# Patient Record
Sex: Female | Born: 1940 | Race: Black or African American | Hispanic: No | Smoking: Former smoker
Health system: Southern US, Community
[De-identification: ages and names within clinical notes are randomized; demographics above are authoritative.]

## PROBLEM LIST (undated history)

## (undated) DIAGNOSIS — C50919 Malignant neoplasm of unspecified site of unspecified female breast: Secondary | ICD-10-CM

## (undated) DIAGNOSIS — C801 Malignant (primary) neoplasm, unspecified: Secondary | ICD-10-CM

## (undated) DIAGNOSIS — N184 Chronic kidney disease, stage 4 (severe): Secondary | ICD-10-CM

## (undated) DIAGNOSIS — N289 Disorder of kidney and ureter, unspecified: Secondary | ICD-10-CM

## (undated) DIAGNOSIS — E559 Vitamin D deficiency, unspecified: Secondary | ICD-10-CM

## (undated) DIAGNOSIS — E538 Deficiency of other specified B group vitamins: Secondary | ICD-10-CM

## (undated) DIAGNOSIS — N189 Chronic kidney disease, unspecified: Secondary | ICD-10-CM

## (undated) DIAGNOSIS — Z8719 Personal history of other diseases of the digestive system: Secondary | ICD-10-CM

## (undated) DIAGNOSIS — G4730 Sleep apnea, unspecified: Secondary | ICD-10-CM

## (undated) DIAGNOSIS — J45909 Unspecified asthma, uncomplicated: Secondary | ICD-10-CM

## (undated) DIAGNOSIS — D649 Anemia, unspecified: Secondary | ICD-10-CM

## (undated) DIAGNOSIS — J189 Pneumonia, unspecified organism: Secondary | ICD-10-CM

## (undated) DIAGNOSIS — I10 Essential (primary) hypertension: Secondary | ICD-10-CM

## (undated) DIAGNOSIS — Z923 Personal history of irradiation: Secondary | ICD-10-CM

## (undated) DIAGNOSIS — M1990 Unspecified osteoarthritis, unspecified site: Secondary | ICD-10-CM

## (undated) DIAGNOSIS — T4145XA Adverse effect of unspecified anesthetic, initial encounter: Secondary | ICD-10-CM

## (undated) DIAGNOSIS — I639 Cerebral infarction, unspecified: Secondary | ICD-10-CM

## (undated) DIAGNOSIS — T8859XA Other complications of anesthesia, initial encounter: Secondary | ICD-10-CM

## (undated) DIAGNOSIS — M109 Gout, unspecified: Secondary | ICD-10-CM

## (undated) DIAGNOSIS — E119 Type 2 diabetes mellitus without complications: Secondary | ICD-10-CM

## (undated) DIAGNOSIS — E213 Hyperparathyroidism, unspecified: Secondary | ICD-10-CM

## (undated) DIAGNOSIS — E785 Hyperlipidemia, unspecified: Secondary | ICD-10-CM

## (undated) DIAGNOSIS — R0600 Dyspnea, unspecified: Secondary | ICD-10-CM

## (undated) DIAGNOSIS — K219 Gastro-esophageal reflux disease without esophagitis: Secondary | ICD-10-CM

## (undated) HISTORY — DX: Cerebral infarction, unspecified: I63.9

## (undated) HISTORY — DX: Unspecified asthma, uncomplicated: J45.909

## (undated) HISTORY — DX: Unspecified osteoarthritis, unspecified site: M19.90

## (undated) HISTORY — DX: Malignant neoplasm of unspecified site of unspecified female breast: C50.919

## (undated) HISTORY — DX: Essential (primary) hypertension: I10

## (undated) HISTORY — PX: EYE SURGERY: SHX253

## (undated) HISTORY — PX: MASTECTOMY: SHX3

## (undated) HISTORY — PX: TONSILLECTOMY: SUR1361

## (undated) HISTORY — DX: Type 2 diabetes mellitus without complications: E11.9

## (undated) HISTORY — DX: Hyperlipidemia, unspecified: E78.5

---

## 1977-03-27 HISTORY — PX: ABDOMINAL HYSTERECTOMY: SHX81

## 2005-09-05 ENCOUNTER — Ambulatory Visit: Payer: Self-pay

## 2008-01-08 ENCOUNTER — Ambulatory Visit: Payer: Self-pay | Admitting: Unknown Physician Specialty

## 2008-09-23 ENCOUNTER — Ambulatory Visit: Payer: Self-pay | Admitting: Family Medicine

## 2009-09-28 ENCOUNTER — Ambulatory Visit: Payer: Self-pay | Admitting: Family Medicine

## 2010-10-13 ENCOUNTER — Ambulatory Visit: Payer: Self-pay | Admitting: Family Medicine

## 2011-03-05 ENCOUNTER — Observation Stay: Payer: Self-pay | Admitting: Student

## 2011-04-28 ENCOUNTER — Ambulatory Visit: Payer: Self-pay | Admitting: Specialist

## 2011-10-16 ENCOUNTER — Ambulatory Visit: Payer: Self-pay | Admitting: Family Medicine

## 2011-11-17 ENCOUNTER — Ambulatory Visit: Payer: Self-pay | Admitting: Specialist

## 2012-03-27 HISTORY — PX: OOPHORECTOMY: SHX86

## 2012-07-30 ENCOUNTER — Ambulatory Visit: Payer: Self-pay | Admitting: Family Medicine

## 2012-09-18 ENCOUNTER — Ambulatory Visit: Payer: Self-pay | Admitting: Obstetrics and Gynecology

## 2012-09-18 LAB — BASIC METABOLIC PANEL
Anion Gap: 9 (ref 7–16)
BUN: 35 mg/dL — ABNORMAL HIGH (ref 7–18)
Calcium, Total: 9.7 mg/dL (ref 8.5–10.1)
Co2: 20 mmol/L — ABNORMAL LOW (ref 21–32)
EGFR (Non-African Amer.): 29 — ABNORMAL LOW
Glucose: 147 mg/dL — ABNORMAL HIGH (ref 65–99)
Osmolality: 292 (ref 275–301)
Potassium: 4.6 mmol/L (ref 3.5–5.1)
Sodium: 141 mmol/L (ref 136–145)

## 2012-09-23 ENCOUNTER — Ambulatory Visit: Payer: Self-pay | Admitting: Obstetrics and Gynecology

## 2012-10-01 LAB — PATHOLOGY REPORT

## 2012-10-16 ENCOUNTER — Ambulatory Visit: Payer: Self-pay | Admitting: Family Medicine

## 2013-11-05 ENCOUNTER — Ambulatory Visit: Payer: Self-pay | Admitting: Physician Assistant

## 2014-03-27 DIAGNOSIS — I639 Cerebral infarction, unspecified: Secondary | ICD-10-CM

## 2014-03-27 DIAGNOSIS — Z923 Personal history of irradiation: Secondary | ICD-10-CM

## 2014-03-27 DIAGNOSIS — T8859XA Other complications of anesthesia, initial encounter: Secondary | ICD-10-CM

## 2014-03-27 HISTORY — DX: Cerebral infarction, unspecified: I63.9

## 2014-03-27 HISTORY — PX: BREAST LUMPECTOMY: SHX2

## 2014-03-27 HISTORY — DX: Other complications of anesthesia, initial encounter: T88.59XA

## 2014-03-27 HISTORY — DX: Personal history of irradiation: Z92.3

## 2014-04-24 DIAGNOSIS — Z0100 Encounter for examination of eyes and vision without abnormal findings: Secondary | ICD-10-CM | POA: Diagnosis not present

## 2014-05-06 DIAGNOSIS — E119 Type 2 diabetes mellitus without complications: Secondary | ICD-10-CM | POA: Diagnosis not present

## 2014-05-06 DIAGNOSIS — Z79899 Other long term (current) drug therapy: Secondary | ICD-10-CM | POA: Diagnosis not present

## 2014-05-06 DIAGNOSIS — I10 Essential (primary) hypertension: Secondary | ICD-10-CM | POA: Diagnosis not present

## 2014-05-15 DIAGNOSIS — E785 Hyperlipidemia, unspecified: Secondary | ICD-10-CM | POA: Diagnosis not present

## 2014-05-15 DIAGNOSIS — E119 Type 2 diabetes mellitus without complications: Secondary | ICD-10-CM | POA: Diagnosis not present

## 2014-05-15 DIAGNOSIS — N289 Disorder of kidney and ureter, unspecified: Secondary | ICD-10-CM | POA: Diagnosis not present

## 2014-05-15 DIAGNOSIS — I10 Essential (primary) hypertension: Secondary | ICD-10-CM | POA: Diagnosis not present

## 2014-06-05 DIAGNOSIS — N289 Disorder of kidney and ureter, unspecified: Secondary | ICD-10-CM | POA: Diagnosis not present

## 2014-06-05 DIAGNOSIS — B0230 Zoster ocular disease, unspecified: Secondary | ICD-10-CM | POA: Diagnosis not present

## 2014-06-05 DIAGNOSIS — B0239 Other herpes zoster eye disease: Secondary | ICD-10-CM | POA: Diagnosis not present

## 2014-07-17 NOTE — H&P (Signed)
PATIENT NAME:  Alexis Greer, Alexis Greer MR#:  [id number] DATE OF BIRTH:  October 22, 1940  DATE OF ADMISSION:  09/23/2012  PREOPERATIVE DIAGNOSIS: Solid right ovarian cyst.   POSTOPERATIVE DIAGNOSES: 1.  Solid right ovarian cyst.  2.  Significant pelvic adhesions.   PROCEDURES: Laparoscopic right salpingo-oophorectomy.   SURGEON: Boykin Nearing, MD   FIRST ASSISTANT: Franchot Erichsen, MD   INDICATIONS: This is a 75 year old gravida 2 para 2.  The patient was noted to have a right ovarian mass on CT scan, underwent an ultrasound that showed a solid 5.0 x 3.8 cm right ovarian cyst. CA-125, AFP and LDH all within normal limits.   DESCRIPTION OF PROCEDURE: After adequate general endotracheal anesthesia, the patient was placed in the dorsal supine position, legs placed in the Roland stirrups. Abdomen, perineal and vagina prepped in normal sterile fashion. Foley catheter advanced into the bladder. Drained clear and sponge stick placed in the vagina to be used for manipulation during the procedure. The patient was sterilely draped. A 15 mm infraumbilical incision was made, followed by placement of the laparoscope into the abdominal cavity with the Optiview cannula. No difficulties with this.  The patient's abdomen was insufflated with carbon dioxide. A second port was placed 2 fingerbreadths above the symphysis pubis inferior to the large pannus.  A 10 mm port advanced into the abdominal cavity under direct visualization. A third port was placed in the right lower quadrant 2 cm above the symphysis pubis, sharp dissection used, and a 5 mm port was advanced into the abdominal cavity under direct visualization. Given the patient's morbid obesity difficulty moving bowel cephalad prompted the surgeons to place a fourth port 2 fingerbreadths above the symphysis pubis centrally and the endo retractor was placed into the abdominal cavity to help push the bowel cephalad so evaluation of the pelvis could be accomplished. The  patient's right ovary was encased with scar tissue. The next 20 minutes of the case involved removing the scar tissue. Ultimately, the infundibulopelvic ligament was coagulated with the Kleppingers and Harmonic scalpel was used to then remove the right tube and ovary. Attention was directed towards keeping instruments close to the ovary and not into the sidewall. Good hemostasis was noted. The right ovary and fallopian tube were then placed into an Endobag and removed through the left lower port site. Fascia incision was extended to facilitate removal of the solid ovarian mass. The patient was originally scheduled for bilateral salpingo-oophorectomy and she is status post prior hysterectomy. There were dense omental adhesions on the left sidewall and the ovary could not be visualized given the amount of scar tissue and the pathology was all dictated towards the right side. Surgeons opted not to perform any additional adhesiolysis there. Therefore, the left tube and ovary were left in situ. Pressure was lowered. The patient's abdomen was irrigated. Good hemostasis noted. The patient's abdomen was then deflated, and the port sites were closed with 2-0 fascial layer on the left and then all port sites were closed with interrupted 4-0 Vicryl suture. The left lower port site did need additional 2-0 Vicryl subcutaneous suture to close dead space. All port sites were then Steri-Stripped and covered with Tegaderm. Sponge stick removed and the Foley was removed. There were no complications. Estimated blood loss minimal. Intraoperative fluids 1500 mL. The patient tolerated the procedure well and was taken to the recovery room in good condition.  ____________________________ Boykin Nearing, MD tjs:sb D: 09/23/2012 09:21:36 ET T: 09/23/2012 10:28:21 ET JOB#: [id number]  cc:  Boykin Nearing, MD, <Dictator> Boykin Nearing MD ELECTRONICALLY SIGNED 09/23/2012 13:22

## 2014-07-17 NOTE — Op Note (Signed)
PATIENT NAME:  Alexis Greer, BORELLI MR#:  [id number] DATE OF BIRTH:  [birth year]  DATE OF ADMISSION:  09/23/2012  PREOPERATIVE DIAGNOSIS: Solid right ovarian cyst.   POSTOPERATIVE DIAGNOSES: 1.  Solid right ovarian cyst.  2.  Significant pelvic adhesions.   PROCEDURES: Laparoscopic right salpingo-oophorectomy.   SURGEON: Boykin Nearing, MD   FIRST ASSISTANT: Franchot Erichsen, MD   INDICATIONS: This is a 74 year old gravida 2 para 2.  The patient was noted to have a right ovarian mass on CT scan, underwent an ultrasound that showed a solid 5.0 x 3.8 cm right ovarian cyst. CA-125, AFP and LDH all within normal limits.   DESCRIPTION OF PROCEDURE: After adequate general endotracheal anesthesia, the patient was placed in the dorsal supine position, legs placed in the [place] stirrups. Abdomen, perineal and vagina prepped in normal sterile fashion. Foley catheter advanced into the bladder. Drained clear and sponge stick placed in the vagina to be used for manipulation during the procedure. The patient was sterilely draped. A 15 mm infraumbilical incision was made, followed by placement of the laparoscope into the abdominal cavity with the Optiview cannula. No difficulties with this.  The patient's abdomen was insufflated with carbon dioxide. A second port was placed 2 fingerbreadths above the symphysis pubis inferior to the large pannus.  A 10 mm port advanced into the abdominal cavity under direct visualization. A third port was placed in the right lower quadrant 2 cm above the symphysis pubis, sharp dissection used, and a 5 mm port was advanced into the abdominal cavity under direct visualization. Given the patient's morbid obesity difficulty moving bowel cephalad prompted the surgeons to place a fourth port 2 fingerbreadths above the symphysis pubis centrally and the endo retractor was placed into the abdominal cavity to help push the bowel cephalad so evaluation of the pelvis could be accomplished. The  patient's right ovary was encased with scar tissue. The next 20 minutes of the case involved removing the scar tissue. Ultimately, the infundibulopelvic ligament was coagulated with the Kleppingers and Harmonic scalpel was used to then remove the right tube and ovary. Attention was directed towards keeping instruments close to the ovary and not into the sidewall. Good hemostasis was noted. The right ovary and fallopian tube were then placed into an Endobag and removed through the left lower port site. Fascia incision was extended to facilitate removal of the solid ovarian mass. The patient was originally scheduled for bilateral salpingo-oophorectomy and she is status post prior hysterectomy. There were dense omental adhesions on the left sidewall and the ovary could not be visualized given the amount of scar tissue and the pathology was all dictated towards the right side. Surgeons opted not to perform any additional adhesiolysis there. Therefore, the left tube and ovary were left in situ. Pressure was lowered. The patient's abdomen was irrigated. Good hemostasis noted. The patient's abdomen was then deflated, and the port sites were closed with 2-0 fascial layer on the left and then all port sites were closed with interrupted 4-0 Vicryl suture. The left lower port site did need additional 2-0 Vicryl subcutaneous suture to close dead space. All port sites were then Steri-Stripped and covered with Tegaderm. Sponge stick removed and the Foley was removed. There were no complications. Estimated blood loss minimal. Intraoperative fluids 1500 mL. The patient tolerated the procedure well and was taken to the recovery room in good condition.  ____________________________ Boykin Nearing, MD tjs:sb D: 09/23/2012 09:21:36 ET T: 09/23/2012 10:28:21 ET JOB#: [id number]  cc:  Boykin Nearing, MD, <Dictator> Boykin Nearing MD ELECTRONICALLY SIGNED 09/23/2012 13:22

## 2014-10-14 DIAGNOSIS — Z79899 Other long term (current) drug therapy: Secondary | ICD-10-CM | POA: Diagnosis not present

## 2014-10-14 DIAGNOSIS — E119 Type 2 diabetes mellitus without complications: Secondary | ICD-10-CM | POA: Diagnosis not present

## 2014-10-21 DIAGNOSIS — M1A9XX0 Chronic gout, unspecified, without tophus (tophi): Secondary | ICD-10-CM | POA: Diagnosis not present

## 2014-10-21 DIAGNOSIS — E785 Hyperlipidemia, unspecified: Secondary | ICD-10-CM | POA: Diagnosis not present

## 2014-10-21 DIAGNOSIS — E119 Type 2 diabetes mellitus without complications: Secondary | ICD-10-CM | POA: Diagnosis not present

## 2014-10-21 DIAGNOSIS — I10 Essential (primary) hypertension: Secondary | ICD-10-CM | POA: Diagnosis not present

## 2014-10-29 ENCOUNTER — Other Ambulatory Visit: Payer: Self-pay | Admitting: Family Medicine

## 2014-10-29 DIAGNOSIS — Z1239 Encounter for other screening for malignant neoplasm of breast: Secondary | ICD-10-CM

## 2014-11-09 ENCOUNTER — Ambulatory Visit: Payer: Self-pay

## 2014-11-09 ENCOUNTER — Ambulatory Visit
Admission: RE | Admit: 2014-11-09 | Discharge: 2014-11-09 | Disposition: A | Discharge status: Home / Self Care | Payer: Commercial Managed Care - HMO | Source: Ambulatory Visit | Attending: Family Medicine | Admitting: Family Medicine

## 2014-11-09 ENCOUNTER — Other Ambulatory Visit: Payer: Self-pay | Admitting: Adult Health

## 2014-11-09 ENCOUNTER — Other Ambulatory Visit: Payer: Self-pay | Admitting: Family Medicine

## 2014-11-09 DIAGNOSIS — Z1239 Encounter for other screening for malignant neoplasm of breast: Secondary | ICD-10-CM

## 2014-11-09 DIAGNOSIS — Z1231 Encounter for screening mammogram for malignant neoplasm of breast: Secondary | ICD-10-CM | POA: Diagnosis not present

## 2014-11-09 DIAGNOSIS — R928 Other abnormal and inconclusive findings on diagnostic imaging of breast: Secondary | ICD-10-CM

## 2014-11-09 DIAGNOSIS — N63 Unspecified lump in unspecified breast: Secondary | ICD-10-CM

## 2014-11-10 DIAGNOSIS — E559 Vitamin D deficiency, unspecified: Secondary | ICD-10-CM | POA: Diagnosis not present

## 2014-11-10 DIAGNOSIS — E538 Deficiency of other specified B group vitamins: Secondary | ICD-10-CM | POA: Diagnosis not present

## 2014-11-10 DIAGNOSIS — Z0000 Encounter for general adult medical examination without abnormal findings: Secondary | ICD-10-CM | POA: Diagnosis not present

## 2014-11-10 DIAGNOSIS — E780 Pure hypercholesterolemia: Secondary | ICD-10-CM | POA: Diagnosis not present

## 2014-11-10 DIAGNOSIS — N289 Disorder of kidney and ureter, unspecified: Secondary | ICD-10-CM | POA: Diagnosis not present

## 2014-11-13 ENCOUNTER — Ambulatory Visit
Admission: RE | Admit: 2014-11-13 | Discharge: 2014-11-13 | Disposition: A | Discharge status: Home / Self Care | Payer: Commercial Managed Care - HMO | Source: Ambulatory Visit | Attending: Family Medicine | Admitting: Family Medicine

## 2014-11-13 ENCOUNTER — Other Ambulatory Visit: Payer: Self-pay | Admitting: Adult Health

## 2014-11-13 DIAGNOSIS — N63 Unspecified lump in unspecified breast: Secondary | ICD-10-CM

## 2014-11-13 DIAGNOSIS — R928 Other abnormal and inconclusive findings on diagnostic imaging of breast: Secondary | ICD-10-CM

## 2014-11-17 ENCOUNTER — Ambulatory Visit
Admission: RE | Admit: 2014-11-17 | Discharge: 2014-11-17 | Disposition: A | Discharge status: Home / Self Care | Payer: Commercial Managed Care - HMO | Source: Ambulatory Visit | Attending: Family Medicine | Admitting: Family Medicine

## 2014-11-17 DIAGNOSIS — N63 Unspecified lump in unspecified breast: Secondary | ICD-10-CM

## 2014-11-17 DIAGNOSIS — R928 Other abnormal and inconclusive findings on diagnostic imaging of breast: Secondary | ICD-10-CM

## 2014-11-17 DIAGNOSIS — C50211 Malignant neoplasm of upper-inner quadrant of right female breast: Secondary | ICD-10-CM | POA: Diagnosis not present

## 2014-11-17 DIAGNOSIS — C50911 Malignant neoplasm of unspecified site of right female breast: Secondary | ICD-10-CM | POA: Insufficient documentation

## 2014-11-17 DIAGNOSIS — C50919 Malignant neoplasm of unspecified site of unspecified female breast: Secondary | ICD-10-CM

## 2014-11-17 HISTORY — PX: BREAST BIOPSY: SHX20

## 2014-11-17 HISTORY — DX: Malignant neoplasm of unspecified site of unspecified female breast: C50.919

## 2014-11-19 ENCOUNTER — Encounter: Payer: Self-pay | Admitting: *Deleted

## 2014-11-19 NOTE — Progress Notes (Signed)
  Oncology Nurse Navigator Documentation    Navigator Encounter Type: Introductory phone call (11/19/14 1600) Patient Visit Type: Initial (11/19/14 1600)   Barriers/Navigation Needs: No barriers at this time (11/19/14 1600)      Will take educational literature to Dr. Angie Fava office for patient to pick up on Tuesday.          Time Spent with Patient: 15 (11/19/14 1600)

## 2014-11-24 ENCOUNTER — Ambulatory Visit (INDEPENDENT_AMBULATORY_CARE_PROVIDER_SITE_OTHER): Payer: Commercial Managed Care - HMO | Admitting: General Surgery

## 2014-11-24 ENCOUNTER — Encounter: Payer: Self-pay | Admitting: General Surgery

## 2014-11-24 VITALS — BP 120/70 | HR 66 | Resp 14 | Ht 63.0 in | Wt 240.0 lb

## 2014-11-24 DIAGNOSIS — C50911 Malignant neoplasm of unspecified site of right female breast: Secondary | ICD-10-CM

## 2014-11-24 DIAGNOSIS — C50211 Malignant neoplasm of upper-inner quadrant of right female breast: Secondary | ICD-10-CM

## 2014-11-24 NOTE — Patient Instructions (Addendum)
The patient is aware to call back for any questions or concerns.  

## 2014-11-24 NOTE — Progress Notes (Signed)
Patient ID: Alexis Greer, female   DOB: [birth year], 74 y.o.   MRN: [id number]  Chief Complaint  Patient presents with  . Breast Cancer    HPI Alexis Greer is a 74 y.o. female.  who presents for a breast evaluation. The most recent screening mammogram was done on 11-09-14. She then had additional views, ultrasound and breast biopsy on 11-17-14 showing breast cancer. Patient does perform regular self breast checks and gets regular mammograms done.  She is here today with her daughter and granddaughter.  HPI  Past Medical History  Diagnosis Date  . Diabetes mellitus without complication   . Hypertension   . 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 Asthma   . Arthritis   . Hyperlipidemia     Past Surgical History  Procedure Laterality Date  . Abdominal hysterectomy  1979  . Oophorectomy  2014  . Breast biopsy Right 11/17/2014    Invasive mammory carcinoma    Family History  Problem Relation Age of Onset  . Heart disease Mother   . Lung cancer Sister     smoker    Social History Social History  Substance Use Topics  . Smoking status: Former Smoker    Quit date: 08/25/1993  . Smokeless tobacco: Never Used  . Alcohol Use: No    No Known Allergies  Current Outpatient Prescriptions  Medication Sig Dispense Refill  . allopurinol (ZYLOPRIM) 100 MG tablet Take 200 mg by mouth daily.    Marland Kitchen amLODipine (NORVASC) 10 MG tablet Take 10 mg by mouth daily.    Marland Kitchen aspirin EC 81 MG tablet Take 81 mg by mouth daily.    Marland Kitchen atorvastatin (LIPITOR) 40 MG tablet Take 40 mg by mouth daily.    . carvedilol (COREG) 12.5 MG tablet Take 12.5 mg by mouth 2 (two) times daily.    . chlorthalidone (HYGROTON) 25 MG tablet Take 25 mg by mouth daily.    . Cholecalciferol (D-5000) 5000 UNITS TABS Take by mouth.    . Cyanocobalamin-Salcaprozate (ELIGEN B12) 1000-100 MCG-MG TABS Take by mouth daily.    . ferrous sulfate 325 (65 FE) MG tablet Take 325 mg by mouth daily.    Marland Kitchen  glimepiride (AMARYL) 4 MG tablet Take 4 mg by mouth daily.    . insulin glargine (LANTUS) 100 UNIT/ML injection Inject 10 Units into the skin.    . metFORMIN (GLUCOPHAGE-XR) 500 MG 24 hr tablet Take 500 mg by mouth 2 (two) times daily.    . Multiple Vitamin (MULTI-VITAMINS) TABS Take by mouth.    Marland Kitchen omeprazole (PRILOSEC) 20 MG capsule Take 20 mg by mouth 2 (two) times daily.    . valsartan (DIOVAN) 160 MG tablet Take 160 mg by mouth daily.     No current facility-administered medications for this visit.    Review of Systems Review of Systems  Constitutional: Positive for fatigue.  Respiratory: Positive for wheezing.   Cardiovascular: Negative.     Blood pressure 120/70, pulse 66, resp. rate 14, height 5\' 3"  (1.6 m), weight 240 lb (108.863 kg).  Physical Exam Physical Exam  Constitutional: She is oriented to person, place, and time. She appears well-developed and well-nourished.  HENT:  Mouth/Throat: Oropharynx is clear and moist.  Eyes: Conjunctivae are normal. No scleral icterus.  Neck: Neck supple.  Cardiovascular: Normal rate, regular rhythm and normal heart sounds.   Pulmonary/Chest: Effort normal and breath sounds normal. Right breast exhibits skin change. Right breast exhibits no inverted  nipple, no mass, no nipple discharge and no tenderness. Left breast exhibits no inverted nipple, no mass, no nipple discharge, no skin change and no tenderness.  Mild ecchymosis right breast biopsy site superior at 1 o'clock  Abdominal: Soft. There is no tenderness.  Lymphadenopathy:    She has no cervical adenopathy.    She has no axillary adenopathy.  Neurological: She is alert and oriented to person, place, and time.  Skin: Skin is warm and dry.  Psychiatric: Her behavior is normal.    Data Reviewed Mammogram and [place] reviewed.  Assessment    Invasive mammary carcinoma right breast, triple negative. By [place] she has a 85mm nodule mid depth at 1 ocl right breast    Plan   Detailed  discussion with pt regarding treatment options to include possible chemotherapy after local surgical resection and radiation. Discussed risk and benefits of surgery, right breast lumpectomy with wire localization and SN biopsy. Pt is agreeable. Based on the triple negative results will discuss with oncologist.     PCP:  Rozelle Logan 11/25/2014, 8:07 AM

## 2014-11-25 ENCOUNTER — Other Ambulatory Visit: Payer: Self-pay | Admitting: *Deleted

## 2014-11-25 ENCOUNTER — Encounter: Payer: Self-pay | Admitting: General Surgery

## 2014-11-25 ENCOUNTER — Encounter: Payer: Self-pay | Admitting: *Deleted

## 2014-11-25 DIAGNOSIS — C50211 Malignant neoplasm of upper-inner quadrant of right female breast: Secondary | ICD-10-CM

## 2014-11-25 DIAGNOSIS — D0510 Intraductal carcinoma in situ of unspecified breast: Secondary | ICD-10-CM | POA: Insufficient documentation

## 2014-11-25 DIAGNOSIS — C50219 Malignant neoplasm of upper-inner quadrant of unspecified female breast: Secondary | ICD-10-CM | POA: Insufficient documentation

## 2014-11-25 NOTE — Progress Notes (Signed)
  Oncology Nurse Navigator Documentation    Navigator Encounter Type: Telephone (11/25/14 1600) Patient Visit Type: Follow-up (11/25/14 1600)   Barriers/Navigation Needs: No barriers at this time (11/25/14 1600)     patient received breast cancer educational literature, "My Breast Cancer Treatment Handbook" by Josephine Igo, RN.            Time Spent with Patient: 15 (11/25/14 1600)

## 2014-11-25 NOTE — Progress Notes (Signed)
Patient and her daughter, Alexis Greer, have been notified that Dr. Jamal Collin did speak with the oncologist and they would like patient to proceed with surgery first and then do chemo once they have the final staging. This patient and her daughter verbalize understanding.  Patient's surgery has been scheduled for 12-01-14 at [facility]. It is okay for patient to continue 81 mg aspirin once daily.

## 2014-11-26 ENCOUNTER — Encounter
Admission: RE | Admit: 2014-11-26 | Discharge: 2014-11-26 | Disposition: A | Discharge status: Home / Self Care | Payer: Commercial Managed Care - HMO | Source: Ambulatory Visit | Attending: General Surgery | Admitting: General Surgery

## 2014-11-26 ENCOUNTER — Other Ambulatory Visit: Payer: Self-pay | Admitting: General Surgery

## 2014-11-26 DIAGNOSIS — Z01810 Encounter for preprocedural cardiovascular examination: Secondary | ICD-10-CM | POA: Diagnosis not present

## 2014-11-26 DIAGNOSIS — C50911 Malignant neoplasm of unspecified site of right female breast: Secondary | ICD-10-CM

## 2014-11-26 DIAGNOSIS — I10 Essential (primary) hypertension: Secondary | ICD-10-CM | POA: Diagnosis not present

## 2014-11-26 DIAGNOSIS — Z01812 Encounter for preprocedural laboratory examination: Secondary | ICD-10-CM | POA: Insufficient documentation

## 2014-11-26 DIAGNOSIS — C50211 Malignant neoplasm of upper-inner quadrant of right female breast: Secondary | ICD-10-CM

## 2014-11-26 LAB — CBC
HEMATOCRIT: 36.4 % (ref 35.0–47.0)
Hemoglobin: 11.7 g/dL — ABNORMAL LOW (ref 12.0–16.0)
MCH: 29.7 pg (ref 26.0–34.0)
MCHC: 32.1 g/dL (ref 32.0–36.0)
MCV: 92.6 fL (ref 80.0–100.0)
Platelets: 317 10*3/uL (ref 150–440)
RBC: 3.93 MIL/uL (ref 3.80–5.20)
RDW: 17.5 % — AB (ref 11.5–14.5)
WBC: 6.7 10*3/uL (ref 3.6–11.0)

## 2014-11-26 LAB — SURGICAL PATHOLOGY

## 2014-11-26 NOTE — Patient Instructions (Signed)
  Your procedure is scheduled on:  12-01-14 @ 8:00AM Report to [facility] center   Remember: Instructions that are not followed completely may result in serious medical risk, up to and including death, or upon the discretion of your surgeon and anesthesiologist your surgery may need to be rescheduled.    __x__ 1. Do not eat food or drink liquids after midnight. No gum chewing or hard candies.     ____ 2. No Alcohol for 24 hours before or after surgery.   ____ 3. Bring all medications with you on the day of surgery if instructed.    ___x_ 4. Notify your doctor if there is any change in your medical condition     (cold, fever, infections).     Do not wear jewelry, make-up, hairpins, clips or nail polish.  Do not wear lotions, powders, or perfumes. You may wear deodorant.  Do not shave 48 hours prior to surgery. Men may shave face and neck.  Do not bring valuables to the hospital.    [facility] is not responsible for any belongings or valuables.               Contacts, dentures or bridgework may not be worn into surgery.  Leave your suitcase in the car. After surgery it may be brought to your room.  For patients admitted to the hospital, discharge time is determined by your                treatment team.   Patients discharged the day of surgery will not be allowed to drive home.     ___x_ Take these medicines the morning of surgery with A SIP OF WATER:    1. amlodipine  2. carvedilol  3. omeprazole  4.  5.  6.  ____ Fleet Enema (as directed)   __x__ Use CHG Soap as directed  ____ Use inhalers on the day of surgery  __x Stop metformin 2 days prior to surgery    ____ Take 1/2 of usual insulin dose the night before surgery and none on the morning of surgery.   __x_ Stop Coumadin/Plavix/aspirin now  __x__ Stop Anti-inflammatories on aleve   ____ Stop supplements until after surgery.    ____ Bring C-Pap to the hospital.     _

## 2014-11-27 ENCOUNTER — Encounter: Payer: Self-pay | Admitting: Anatomic Pathology & Clinical Pathology

## 2014-12-01 ENCOUNTER — Ambulatory Visit
Admission: RE | Admit: 2014-12-01 | Discharge: 2014-12-01 | Disposition: A | Discharge status: Home / Self Care | Payer: Commercial Managed Care - HMO | Source: Ambulatory Visit | Attending: General Surgery | Admitting: General Surgery

## 2014-12-01 ENCOUNTER — Encounter: Payer: Self-pay | Admitting: *Deleted

## 2014-12-01 ENCOUNTER — Ambulatory Visit: Payer: Commercial Managed Care - HMO | Admitting: Anesthesiology

## 2014-12-01 ENCOUNTER — Inpatient Hospital Stay
Admission: AD | Admit: 2014-12-01 | Discharge: 2014-12-02 | DRG: 041 | Disposition: A | Discharge status: Home / Self Care | Payer: Commercial Managed Care - HMO | Source: Ambulatory Visit | Attending: Internal Medicine | Admitting: Internal Medicine

## 2014-12-01 ENCOUNTER — Inpatient Hospital Stay: Payer: Commercial Managed Care - HMO

## 2014-12-01 ENCOUNTER — Inpatient Hospital Stay
Admission: RE | Admit: 2014-12-01 | Discharge: 2014-12-01 | Disposition: A | Discharge status: Home / Self Care | Payer: Commercial Managed Care - HMO | Source: Ambulatory Visit | Attending: Internal Medicine | Admitting: Internal Medicine

## 2014-12-01 ENCOUNTER — Ambulatory Visit: Payer: Commercial Managed Care - HMO

## 2014-12-01 ENCOUNTER — Encounter
Admission: AD | Disposition: A | Discharge status: Home / Self Care | Payer: Self-pay | Source: Ambulatory Visit | Attending: Internal Medicine

## 2014-12-01 DIAGNOSIS — C50211 Malignant neoplasm of upper-inner quadrant of right female breast: Secondary | ICD-10-CM

## 2014-12-01 DIAGNOSIS — C50411 Malignant neoplasm of upper-outer quadrant of right female breast: Secondary | ICD-10-CM | POA: Diagnosis not present

## 2014-12-01 DIAGNOSIS — I959 Hypotension, unspecified: Secondary | ICD-10-CM | POA: Diagnosis not present

## 2014-12-01 DIAGNOSIS — I9788 Other intraoperative complications of the circulatory system, not elsewhere classified: Secondary | ICD-10-CM | POA: Diagnosis present

## 2014-12-01 DIAGNOSIS — Z79899 Other long term (current) drug therapy: Secondary | ICD-10-CM | POA: Diagnosis not present

## 2014-12-01 DIAGNOSIS — Z6841 Body Mass Index (BMI) 40.0 and over, adult: Secondary | ICD-10-CM | POA: Diagnosis not present

## 2014-12-01 DIAGNOSIS — R471 Dysarthria and anarthria: Secondary | ICD-10-CM | POA: Diagnosis present

## 2014-12-01 DIAGNOSIS — I9589 Other hypotension: Secondary | ICD-10-CM | POA: Diagnosis present

## 2014-12-01 DIAGNOSIS — J45909 Unspecified asthma, uncomplicated: Secondary | ICD-10-CM | POA: Diagnosis present

## 2014-12-01 DIAGNOSIS — Z794 Long term (current) use of insulin: Secondary | ICD-10-CM | POA: Diagnosis not present

## 2014-12-01 DIAGNOSIS — M1990 Unspecified osteoarthritis, unspecified site: Secondary | ICD-10-CM | POA: Diagnosis present

## 2014-12-01 DIAGNOSIS — Z7982 Long term (current) use of aspirin: Secondary | ICD-10-CM | POA: Diagnosis not present

## 2014-12-01 DIAGNOSIS — I638 Other cerebral infarction: Secondary | ICD-10-CM | POA: Diagnosis not present

## 2014-12-01 DIAGNOSIS — Y838 Other surgical procedures as the cause of abnormal reaction of the patient, or of later complication, without mention of misadventure at the time of the procedure: Secondary | ICD-10-CM | POA: Diagnosis present

## 2014-12-01 DIAGNOSIS — Z87891 Personal history of nicotine dependence: Secondary | ICD-10-CM

## 2014-12-01 DIAGNOSIS — C50912 Malignant neoplasm of unspecified site of left female breast: Secondary | ICD-10-CM | POA: Diagnosis not present

## 2014-12-01 DIAGNOSIS — Z885 Allergy status to narcotic agent status: Secondary | ICD-10-CM

## 2014-12-01 DIAGNOSIS — I97811 Intraoperative cerebrovascular infarction during other surgery: Secondary | ICD-10-CM | POA: Diagnosis not present

## 2014-12-01 DIAGNOSIS — C50911 Malignant neoplasm of unspecified site of right female breast: Secondary | ICD-10-CM

## 2014-12-01 DIAGNOSIS — E785 Hyperlipidemia, unspecified: Secondary | ICD-10-CM | POA: Diagnosis not present

## 2014-12-01 DIAGNOSIS — Z171 Estrogen receptor negative status [ER-]: Secondary | ICD-10-CM

## 2014-12-01 DIAGNOSIS — R4781 Slurred speech: Secondary | ICD-10-CM | POA: Diagnosis not present

## 2014-12-01 DIAGNOSIS — E119 Type 2 diabetes mellitus without complications: Secondary | ICD-10-CM | POA: Diagnosis not present

## 2014-12-01 DIAGNOSIS — E1149 Type 2 diabetes mellitus with other diabetic neurological complication: Secondary | ICD-10-CM | POA: Diagnosis not present

## 2014-12-01 DIAGNOSIS — I10 Essential (primary) hypertension: Secondary | ICD-10-CM | POA: Diagnosis present

## 2014-12-01 DIAGNOSIS — R29810 Facial weakness: Secondary | ICD-10-CM | POA: Diagnosis present

## 2014-12-01 DIAGNOSIS — Z9889 Other specified postprocedural states: Secondary | ICD-10-CM

## 2014-12-01 DIAGNOSIS — I639 Cerebral infarction, unspecified: Secondary | ICD-10-CM | POA: Diagnosis present

## 2014-12-01 HISTORY — PX: BREAST LUMPECTOMY WITH SENTINEL LYMPH NODE BIOPSY: SHX5597

## 2014-12-01 HISTORY — DX: Adverse effect of unspecified anesthetic, initial encounter: T41.45XA

## 2014-12-01 HISTORY — DX: Other complications of anesthesia, initial encounter: T88.59XA

## 2014-12-01 HISTORY — PX: BREAST BIOPSY: SHX20

## 2014-12-01 LAB — GLUCOSE, CAPILLARY
Glucose-Capillary: 176 mg/dL — ABNORMAL HIGH (ref 65–99)
Glucose-Capillary: 153 mg/dL — ABNORMAL HIGH (ref 65–99)
Glucose-Capillary: 179 mg/dL — ABNORMAL HIGH (ref 65–99)

## 2014-12-01 SURGERY — BREAST LUMPECTOMY WITH SENTINEL LYMPH NODE BX
Anesthesia: General | Laterality: Right | Wound class: Clean

## 2014-12-01 MED ORDER — ACETAMINOPHEN 10 MG/ML IV SOLN
INTRAVENOUS | Status: AC
  Filled 2014-12-01: qty 100

## 2014-12-01 MED ORDER — FENTANYL CITRATE (PF) 100 MCG/2ML IJ SOLN
INTRAMUSCULAR | Status: DC | PRN
  Administered 2014-12-01: 100 ug via INTRAVENOUS
  Administered 2014-12-01: 25 ug via INTRAVENOUS

## 2014-12-01 MED ORDER — SODIUM CHLORIDE 0.9 % IJ SOLN
INTRAMUSCULAR | Status: AC
Start: 2014-12-01 — End: 2014-12-01
  Filled 2014-12-01: qty 10

## 2014-12-01 MED ORDER — SODIUM CHLORIDE 0.9 % IJ SOLN
INTRAMUSCULAR | Status: DC | PRN
  Administered 2014-12-01: 5 mL

## 2014-12-01 MED ORDER — PHENYLEPHRINE HCL 10 MG/ML IJ SOLN
INTRAMUSCULAR | Status: DC | PRN
  Administered 2014-12-01 (×2): 100 ug via INTRAVENOUS
  Administered 2014-12-01 (×4): 200 ug via INTRAVENOUS

## 2014-12-01 MED ORDER — CHLORHEXIDINE GLUCONATE 4 % EX LIQD: 1.0000 "application " | Freq: Once | CUTANEOUS | Status: DC

## 2014-12-01 MED ORDER — OXYCODONE HCL 5 MG/5ML PO SOLN: 5.0000 mg | Freq: Once | ORAL | Status: DC | PRN

## 2014-12-01 MED ORDER — FENTANYL CITRATE (PF) 100 MCG/2ML IJ SOLN: 25.0000 ug | INTRAMUSCULAR | Status: DC | PRN

## 2014-12-01 MED ORDER — MORPHINE SULFATE (PF) 2 MG/ML IV SOLN
2.0000 mg | INTRAVENOUS | Status: DC | PRN
  Filled 2014-12-01: qty 1

## 2014-12-01 MED ORDER — PANTOPRAZOLE SODIUM 40 MG PO TBEC
40.0000 mg | DELAYED_RELEASE_TABLET | Freq: Every day | ORAL | Status: DC
  Administered 2014-12-02: 12:00:00 40 mg via ORAL
  Filled 2014-12-01: qty 1

## 2014-12-01 MED ORDER — ASPIRIN EC 325 MG PO TBEC
DELAYED_RELEASE_TABLET | ORAL | Status: AC
  Administered 2014-12-01: 325 mg
  Filled 2014-12-01: qty 1

## 2014-12-01 MED ORDER — NEOSTIGMINE METHYLSULFATE 10 MG/10ML IV SOLN
INTRAVENOUS | Status: DC | PRN
  Administered 2014-12-01: 3 mg via INTRAVENOUS

## 2014-12-01 MED ORDER — PROPOFOL 10 MG/ML IV BOLUS
INTRAVENOUS | Status: DC | PRN
  Administered 2014-12-01: 170 mg via INTRAVENOUS

## 2014-12-01 MED ORDER — ACETAMINOPHEN 10 MG/ML IV SOLN
INTRAVENOUS | Status: DC | PRN
  Administered 2014-12-01: 1000 mg via INTRAVENOUS

## 2014-12-01 MED ORDER — INSULIN ASPART 100 UNIT/ML ~~LOC~~ SOLN
0.0000 [IU] | Freq: Three times a day (TID) | SUBCUTANEOUS | Status: DC
  Administered 2014-12-02: 3 [IU] via SUBCUTANEOUS
  Administered 2014-12-02: 2 [IU] via SUBCUTANEOUS
  Filled 2014-12-01: qty 3
  Filled 2014-12-01: qty 2

## 2014-12-01 MED ORDER — IPRATROPIUM-ALBUTEROL 0.5-2.5 (3) MG/3ML IN SOLN
RESPIRATORY_TRACT | Status: AC
  Filled 2014-12-01: qty 3

## 2014-12-01 MED ORDER — OXYCODONE-ACETAMINOPHEN 5-325 MG PO TABS: 1.0000 | ORAL_TABLET | ORAL | Status: DC | PRN

## 2014-12-01 MED ORDER — IPRATROPIUM-ALBUTEROL 0.5-2.5 (3) MG/3ML IN SOLN: 3.0000 mL | Freq: Once | RESPIRATORY_TRACT | Status: DC | PRN

## 2014-12-01 MED ORDER — VASOPRESSIN 20 UNIT/ML IV SOLN
INTRAVENOUS | Status: DC | PRN
  Administered 2014-12-01 (×3): 2 [IU] via INTRAVENOUS

## 2014-12-01 MED ORDER — FERROUS SULFATE 325 (65 FE) MG PO TABS
325.0000 mg | ORAL_TABLET | Freq: Every day | ORAL | Status: DC
  Administered 2014-12-02: 325 mg via ORAL
  Filled 2014-12-01: qty 1

## 2014-12-01 MED ORDER — LIDOCAINE HCL (CARDIAC) 20 MG/ML IV SOLN
INTRAVENOUS | Status: DC | PRN
  Administered 2014-12-01: 60 mg via INTRAVENOUS

## 2014-12-01 MED ORDER — GLYCOPYRROLATE 0.2 MG/ML IJ SOLN
INTRAMUSCULAR | Status: DC | PRN
  Administered 2014-12-01: 0.6 mg via INTRAVENOUS

## 2014-12-01 MED ORDER — ATORVASTATIN CALCIUM 20 MG PO TABS
40.0000 mg | ORAL_TABLET | Freq: Every day | ORAL | Status: DC
  Administered 2014-12-02: 08:00:00 40 mg via ORAL
  Filled 2014-12-01 (×2): qty 2

## 2014-12-01 MED ORDER — MIDAZOLAM HCL 5 MG/5ML IJ SOLN
INTRAMUSCULAR | Status: DC | PRN
  Administered 2014-12-01: 2 mg via INTRAVENOUS

## 2014-12-01 MED ORDER — INSULIN GLARGINE 100 UNIT/ML ~~LOC~~ SOLN
10.0000 [IU] | Freq: Every morning | SUBCUTANEOUS | Status: DC
  Administered 2014-12-02: 12:00:00 10 [IU] via SUBCUTANEOUS
  Filled 2014-12-01 (×2): qty 0.1

## 2014-12-01 MED ORDER — ALLOPURINOL 100 MG PO TABS
200.0000 mg | ORAL_TABLET | Freq: Every day | ORAL | Status: DC
  Administered 2014-12-02: 08:00:00 200 mg via ORAL
  Filled 2014-12-01: qty 2

## 2014-12-01 MED ORDER — ASPIRIN 325 MG PO TABS
325.0000 mg | ORAL_TABLET | Freq: Once | ORAL | Status: AC
  Administered 2014-12-01: 325 mg via ORAL

## 2014-12-01 MED ORDER — INSULIN ASPART 100 UNIT/ML ~~LOC~~ SOLN: 0.0000 [IU] | Freq: Every day | SUBCUTANEOUS | Status: DC

## 2014-12-01 MED ORDER — BUPIVACAINE HCL (PF) 0.5 % IJ SOLN
INTRAMUSCULAR | Status: AC
  Filled 2014-12-01: qty 30

## 2014-12-01 MED ORDER — IPRATROPIUM-ALBUTEROL 0.5-2.5 (3) MG/3ML IN SOLN
3.0000 mL | Freq: Once | RESPIRATORY_TRACT | Status: AC
  Administered 2014-12-01: 3 mL via RESPIRATORY_TRACT

## 2014-12-01 MED ORDER — ACETAMINOPHEN 650 MG RE SUPP: 650.0000 mg | Freq: Four times a day (QID) | RECTAL | Status: DC | PRN

## 2014-12-01 MED ORDER — ASPIRIN EC 81 MG PO TBEC
81.0000 mg | DELAYED_RELEASE_TABLET | Freq: Every day | ORAL | Status: DC
  Administered 2014-12-02: 09:00:00 81 mg via ORAL
  Filled 2014-12-01: qty 1

## 2014-12-01 MED ORDER — CEFAZOLIN SODIUM-DEXTROSE 2-3 GM-% IV SOLR: 2.0000 g | INTRAVENOUS | Status: AC

## 2014-12-01 MED ORDER — ROCURONIUM BROMIDE 100 MG/10ML IV SOLN
INTRAVENOUS | Status: DC | PRN
  Administered 2014-12-01: 30 mg via INTRAVENOUS
  Administered 2014-12-01: 5 mg via INTRAVENOUS

## 2014-12-01 MED ORDER — SODIUM CHLORIDE 0.9 % IJ SOLN
3.0000 mL | Freq: Two times a day (BID) | INTRAMUSCULAR | Status: DC
  Administered 2014-12-01: 3 mL via INTRAVENOUS

## 2014-12-01 MED ORDER — CEFAZOLIN SODIUM-DEXTROSE 2-3 GM-% IV SOLR
INTRAVENOUS | Status: AC
  Filled 2014-12-01: qty 50

## 2014-12-01 MED ORDER — OXYCODONE HCL 5 MG PO TABS: 5.0000 mg | ORAL_TABLET | Freq: Once | ORAL | Status: DC | PRN

## 2014-12-01 MED ORDER — OXYCODONE HCL 5 MG/5ML PO SOLN
ORAL | Status: DC
Start: 2014-12-01 — End: 2014-12-01
  Filled 2014-12-01: qty 5

## 2014-12-01 MED ORDER — SODIUM CHLORIDE 0.9 % IV SOLN
INTRAVENOUS | Status: DC
  Administered 2014-12-01 (×3): via INTRAVENOUS

## 2014-12-01 MED ORDER — LORAZEPAM 2 MG/ML IJ SOLN: 0.5000 mg | Freq: Once | INTRAMUSCULAR | Status: DC | PRN

## 2014-12-01 MED ORDER — ACETAMINOPHEN 325 MG PO TABS: 650.0000 mg | ORAL_TABLET | Freq: Four times a day (QID) | ORAL | Status: DC | PRN

## 2014-12-01 MED ORDER — ONDANSETRON HCL 4 MG/2ML IJ SOLN
INTRAMUSCULAR | Status: DC | PRN
Start: 2014-12-01 — End: 2014-12-01
  Administered 2014-12-01: 4 mg via INTRAVENOUS

## 2014-12-01 MED ORDER — OXYCODONE HCL 5 MG PO TABS
5.0000 mg | ORAL_TABLET | ORAL | Status: DC | PRN
  Administered 2014-12-01: 16:00:00 5 mg via ORAL
  Filled 2014-12-01: qty 1

## 2014-12-01 MED ORDER — TECHNETIUM TC 99M SULFUR COLLOID
1.0320 | Freq: Once | INTRAVENOUS | Status: DC | PRN
  Administered 2014-12-01: 1.032 via INTRAVENOUS
  Filled 2014-12-01: qty 1.03

## 2014-12-01 MED ORDER — METHYLENE BLUE 1 % INJ SOLN
INTRAMUSCULAR | Status: AC
  Filled 2014-12-01: qty 10

## 2014-12-01 SURGICAL SUPPLY — 49 items
BLADE SURG 15 STRL SS SAFETY (BLADE) ×3 IMPLANT
BULB RESERV EVAC DRAIN JP 100C (MISCELLANEOUS) IMPLANT
CANISTER SUCT 1200ML W/VALVE (MISCELLANEOUS) ×3 IMPLANT
CHLORAPREP W/TINT 26ML (MISCELLANEOUS) ×3 IMPLANT
CLOSURE WOUND 1/2 X4 (GAUZE/BANDAGES/DRESSINGS)
CNTNR SPEC 2.5X3XGRAD LEK (MISCELLANEOUS) ×1
CONT SPEC 4OZ STER OR WHT (MISCELLANEOUS) ×2
CONTAINER SPEC 2.5X3XGRAD LEK (MISCELLANEOUS) ×1 IMPLANT
COVER PROBE FLX POLY STRL (MISCELLANEOUS) ×3 IMPLANT
DEVICE CAVITY EVALUATION 9031 (MISCELLANEOUS) ×3 IMPLANT
DEVICE DUBIN SPECIMEN MAMMOGRA (MISCELLANEOUS) ×3 IMPLANT
DEVICE LOCALIZATION ULTRAWIRE (WIRE) IMPLANT
DEVICE ULTRAWIRE LOCAL 19X9 (WIRE) IMPLANT
DRAIN CHANNEL JP 15F RND 16 (MISCELLANEOUS) IMPLANT
DRAPE LAPAROTOMY 100X77 ABD (DRAPES) ×3 IMPLANT
DRAPE LAPAROTOMY TRNSV 106X77 (MISCELLANEOUS) ×3 IMPLANT
GLOVE BIO SURGEON STRL SZ7 (GLOVE) ×15 IMPLANT
GOWN STRL REUS W/ TWL LRG LVL3 (GOWN DISPOSABLE) ×3 IMPLANT
GOWN STRL REUS W/TWL LRG LVL3 (GOWN DISPOSABLE) ×6
HARMONIC SCALPEL FOCUS (MISCELLANEOUS) IMPLANT
KIT RM TURNOVER STRD PROC AR (KITS) ×3 IMPLANT
LABEL OR SOLS (LABEL) IMPLANT
LIQUID BAND (GAUZE/BANDAGES/DRESSINGS) ×3 IMPLANT
MARGIN MAP 10MM (MISCELLANEOUS) ×3 IMPLANT
NDL HPO THNWL 1X22GA REG BVL (NEEDLE) ×1 IMPLANT
NDL SAFETY 22GX1.5 (NEEDLE) ×3 IMPLANT
NEEDLE HYPO 25X1 1.5 SAFETY (NEEDLE) ×3 IMPLANT
NEEDLE SAFETY 22GX1 (NEEDLE) ×2
PACK BASIN MINOR ARMC (MISCELLANEOUS) ×3 IMPLANT
PAD GROUND ADULT SPLIT (MISCELLANEOUS) ×3 IMPLANT
SLEVE PROBE SENORX GAMMA FIND (MISCELLANEOUS) ×3 IMPLANT
STRIP CLOSURE SKIN 1/2X4 (GAUZE/BANDAGES/DRESSINGS) IMPLANT
SUT ETH BLK MONO 3 0 FS 1 12/B (SUTURE) ×3 IMPLANT
SUT ETHILON 3-0 FS-10 30 BLK (SUTURE) ×3
SUT MNCRL AB 3-0 PS2 27 (SUTURE) ×3 IMPLANT
SUT VIC AB 2-0 BRD 54 (SUTURE) ×3 IMPLANT
SUT VIC AB 2-0 CT2 27 (SUTURE) ×6 IMPLANT
SUT VIC AB 2-0 SH 27 (SUTURE)
SUT VIC AB 2-0 SH 27XBRD (SUTURE) IMPLANT
SUT VIC AB 3-0 54X BRD REEL (SUTURE) ×1 IMPLANT
SUT VIC AB 3-0 BRD 54 (SUTURE) ×2
SUT VIC AB 4-0 FS2 27 (SUTURE) ×3 IMPLANT
SUTURE EHLN 3-0 FS-10 30 BLK (SUTURE) ×1 IMPLANT
SYR CONTROL 10ML (SYRINGE) ×3 IMPLANT
SYRINGE 10CC LL (SYRINGE) ×3 IMPLANT
TAPE TRANSPORE STRL 2 31045 (GAUZE/BANDAGES/DRESSINGS) ×3 IMPLANT
ULTRAWIRE LOCAL DEVICE 19X9 (WIRE)
ULTRAWIRE LOCALIZATION DEVICE (WIRE)
WATER STERILE IRR 1000ML POUR (IV SOLUTION) ×3 IMPLANT

## 2014-12-01 NOTE — Progress Notes (Signed)
AC called to place IV access. Madlyn Frankel, RN

## 2014-12-01 NOTE — Progress Notes (Signed)
*  PRELIMINARY RESULTS* Echocardiogram 2D Echocardiogram has been performed.  [place] 12/01/2014, 4:51 PM

## 2014-12-01 NOTE — Plan of Care (Signed)
Problem: Discharge/Transitional Outcomes Goal: Other Discharge Outcomes/Goals Outcome: Progressing Plan of care progress to goal:  Patient went for lumpectomy on right side today. Post-op patient presented with slurred speech and left facial droop. Patient admitted for CVA. CT negative. 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 Echo and [place] in progress. Family at bedside.

## 2014-12-01 NOTE — Progress Notes (Signed)
Dr Jamal Collin in to see pt  Dr Leslye Peer asking a lot of questions

## 2014-12-01 NOTE — Progress Notes (Signed)
Dr piscitello in to see pt about expiratory effort   Tried pt off oxygen  Sat down to 91 lungs diminished

## 2014-12-01 NOTE — Plan of Care (Signed)
Problem: Discharge/Transitional Outcomes Goal: Other Discharge Outcomes/Goals Outcome: Progressing Individualization of Care:   Patient prefers to be called Alexis Greer; from home alone. Ambulates with a cane. PMH: diabetes, hypertension, hyperlipidemia, breast ca., asthma, and arthritis.

## 2014-12-01 NOTE — Progress Notes (Signed)
Family members in to see pt

## 2014-12-01 NOTE — Op Note (Signed)
Preop diagnosis: Carcinoma right breast upper outer quadrant   Post op diagnosis: Same  Operation: Right breast lumpectomy, sentinel node biopsy, evaluation for subsequent MammoSite placement  Surgeon: S.G.Sydell Prowell  Assistant:     Anesthesia: Gen.  Complications: None  EBL: Less than 20 mL  Drains: None  Description: Patient underwent wire localization of the side of primary in the op right breast upper inner quadrant preoperatively. Also she had does nuclear contrast injection for sentinel node identification. Patient was brought to the operating room and put to sleep with an endotracheal tube. The right breast maxilla was then prepped and draped sterile field. The signal activity from mom nuclear contrast was essentially minimal in the inferior right axilla. Therefore of 5 mL of 50% diluted methylene blue was injected in the subareolar region. After prepping and draping timeout was performed. The sentinel node the was operated on first. A transverse incision along the inferior aspect of the axilla medially was made. This was dissected through into the deep axillary fat pad and bleeding was controlled cautery and ligatures of 3-0 Vicryl. Using the Gamma finder signal activity was identified and that then revealed a blue node which is fairly large with part of it being fat replaced. This node measured approximately 2 cm stone abs centimeter long and about centimeter and a half wide. This was excised out and sent for frozen section was subsequently showed no evidence of gross metastatic disease. The signal activity was essentially absent after removal of the sentinel node and palpation of the area showed no abnormality in the axilla. This wound was subsequently closed with 2-0 Vicryl in the deeper tissues and the skin with subcuticular 3-0 Monocryl. Lumpectomy was performed. Skin incision was made in the wire was freed from the skin. The subcutaneous tissue and skin were elevated on both sides for  about couple centimeters. And using the wire as a guide a good core of tissue surrounding this was dissected off with the use of cautery all the way down to the deeper layers where the excision was completed. The excised specimen was tagged for margins. Specimen mammogram showed the clip in the tip of the wire in place within the specimen. And grossly the margins appeared to be clear. Following this, the cavity evaluation device was used- this catheter was placed through a separate stab incision in from medial to the main incision of the lumpectomy site. The catheter was left uninflated pain in the lumpectomy site. The deeper tissues were closed in 2 layers with 2-0 Vicryl and the skin with subcuticular 3-0 Monocryl. The balloon in the's cavity evaluation device was inflated with 40 mL of fluid. Ultrasound was performed showing that the balloon Balloon was lying in good position with the no deformity and the skin distance to the cavity was approximately 1.6-1.8 cm. This was felt to be a suitable situation for subsequent MammoSite placement. The fluid was withdrawn the catheter was removed. Small skin opening was closed with a single 3-0 Monocryl stitch subcuticular. All incisions were covered with liqui d ban. Patient subsequently extubated and returned recovery room in stable condition.

## 2014-12-01 NOTE — Progress Notes (Signed)
Left side mouth drooping   Hand grips now equal

## 2014-12-01 NOTE — Progress Notes (Signed)
Dr wieting in to see pt  

## 2014-12-01 NOTE — Anesthesia Procedure Notes (Signed)
Procedure Name: Intubation Date/Time: 12/01/2014 10:28 AM Performed by: Dionne Bucy Pre-anesthesia Checklist: Patient identified, Patient being monitored, Timeout performed, Emergency Drugs available and Suction available Patient Re-evaluated:Patient Re-evaluated prior to inductionOxygen Delivery Method: Circle system utilized Preoxygenation: Pre-oxygenation with 100% oxygen Intubation Type: IV induction Ventilation: Mask ventilation without difficulty Laryngoscope Size: Mac and 3 Grade View: Grade I Tube type: Oral Tube size: 7.0 mm Number of attempts: 1 Airway Equipment and Method: Stylet Placement Confirmation: ETT inserted through vocal cords under direct vision,  positive ETCO2 and breath sounds checked- equal and bilateral Secured at: 20 cm Tube secured with: Tape Dental Injury: Teeth and Oropharynx as per pre-operative assessment

## 2014-12-01 NOTE — Interval H&P Note (Signed)
History and Physical Interval Note:  12/01/2014 10:01 AM  Alexis Greer  has presented today for surgery, with the diagnosis of cancer right breast  The various methods of treatment have been discussed with the patient and family. After consideration of risks, benefits and other options for treatment, the patient has consented to  Procedure(s): BREAST LUMPECTOMY WITH SENTINEL LYMPH NODE BX (Right) BREAST BIOPSY WITH NEEDLE LOCALIZATION (Right) as a surgical intervention .  The patient's history has been reviewed, patient examined, no change in status, stable for surgery.  I have reviewed the patient's chart and labs.  Questions were answered to the patient's satisfaction.     SANKAR,SEEPLAPUTHUR G

## 2014-12-01 NOTE — Progress Notes (Signed)
To room 105  Smile better hand grips good  Foot pump and resistance good    Report given  Family aware of pt in room  Applied ted hose and scd    Alexis Greer was given as ordered

## 2014-12-01 NOTE — H&P (Signed)
[facility] [place] NAME: Alexis Greer    MR#:  [id number]  DATE OF BIRTH:  [birth year]  DATE OF ADMISSION:  12/01/2014  PRIMARY CARE PHYSICIAN: Juluis Pitch, MD   REQUESTING/REFERRING PHYSICIAN: Dr. Jamal Collin  CHIEF COMPLAINT:  Called urgently to PACU for slurred speech and possible stroke.  HISTORY OF PRESENT ILLNESS:  Alexis Greer  is a 74 y.o. female with a known history of diabetes, hypertension, hyperlipidemia, breast cancer. She went for a lumpectomy on the right side today by Dr. Jamal Collin. Postoperatively she was found with a left-sided facial droop and slurred speech. Nursing staff also noticed weakness on the right side. The patient has no history of prior stroke. Intraoperatively the patient did have some brief hypotension. The patient's major complaint was soreness in the breast. The patient states that she has some slurred speech. Does not feel weak on one side versus the other. In the PACU she was able to swallow some water for me.  PAST MEDICAL HISTORY:   Past Medical History  Diagnosis Date  . Diabetes mellitus without complication   . Hypertension   . 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 Asthma   . Arthritis   . Hyperlipidemia     PAST SURGICAL HISTORY:   Past Surgical History  Procedure Laterality Date  . Abdominal hysterectomy  1979  . Oophorectomy  2014  . Breast biopsy Right 11/17/2014    Invasive mammory carcinoma    SOCIAL HISTORY:   Social History  Substance Use Topics  . Smoking status: Former Smoker    Quit date: 08/25/1993  . Smokeless tobacco: Never Used  . Alcohol Use: No    FAMILY HISTORY:   Family History  Problem Relation Age of Onset  . Heart disease Mother   . Lung cancer Sister     smoker  . CAD Father   . Hyperlipidemia Father     DRUG ALLERGIES:  No Known Allergies  REVIEW OF SYSTEMS:  CONSTITUTIONAL: No fever, fatigue or weakness.   EYES: No blurred or double vision.wears glasses.  NOSE, AND THROAT: No tinnitus or ear pain. No sore throat. Wears hearing aid. RESPIRATORY: No cough, shortness of breath, wheezing or hemoptysis.  CARDIOVASCULAR: No chest pain, orthopnea, edema.  GASTROINTESTINAL: No nausea, vomiting, diarrhea or abdominal pain. No blood in bowel movements GENITOURINARY: No dysuria, hematuria.  ENDOCRINE: No polyuria, nocturia,  HEMATOLOGY: No anemia, easy bruising or bleeding SKIN: No rash or lesion. MUSCULOSKELETAL: positive for knee pain.   NEUROLOGIC:  positive for slurred speech. No weakness one side versus the other.  PSYCHIATRY: No anxiety or depression.   MEDICATIONS AT HOME:   Prior to Admission medications   Medication Sig Start Date End Date Taking? Authorizing Provider  allopurinol (ZYLOPRIM) 100 MG tablet Take 200 mg by mouth daily. 09/03/14  Yes Historical Provider, MD  amLODipine (NORVASC) 10 MG tablet Take 10 mg by mouth at bedtime.  09/03/14  Yes Historical Provider, MD  aspirin EC 81 MG tablet Take 81 mg by mouth daily.   Yes Historical Provider, MD  atorvastatin (LIPITOR) 40 MG tablet Take 40 mg by mouth daily. 05/25/14  Yes Historical Provider, MD  carvedilol (COREG) 12.5 MG tablet Take 12.5 mg by mouth 2 (two) times daily. 09/03/14  Yes Historical Provider, MD  chlorthalidone (HYGROTON) 25 MG tablet Take 25 mg by mouth every morning.  11/11/14 11/11/15 Yes Historical Provider, MD  Cholecalciferol (D-5000) 5000 UNITS  TABS Take 5,000 Units by mouth every morning.    Yes Historical Provider, MD  Cyanocobalamin-Salcaprozate (ELIGEN B12) 1000-100 MCG-MG TABS Take by mouth daily.   Yes Historical Provider, MD  ferrous sulfate 325 (65 FE) MG tablet Take 325 mg by mouth daily.   Yes Historical Provider, MD  glimepiride (AMARYL) 4 MG tablet Take 2 mg by mouth daily.  05/25/14  Yes Historical Provider, MD  insulin glargine (LANTUS) 100 UNIT/ML injection Inject 10 Units into the skin every morning.     Yes Historical Provider, MD  metFORMIN (GLUCOPHAGE-XR) 500 MG 24 hr tablet Take 500 mg by mouth 2 (two) times daily. 09/03/14  Yes Historical Provider, MD  Multiple Vitamin (MULTI-VITAMINS) TABS Take by mouth.   Yes Historical Provider, MD  omeprazole (PRILOSEC) 20 MG capsule Take 40 mg by mouth every morning.  10/21/13  Yes Historical Provider, MD  valsartan (DIOVAN) 160 MG tablet Take 160 mg by mouth daily. 11/12/14 11/12/15 Yes Historical Provider, MD  oxyCODONE-acetaminophen (ROXICET) 5-325 MG per tablet Take 1 tablet by mouth every 4 (four) hours as needed. 12/01/14   Seeplaputhur Robinette Haines, MD      VITAL SIGNS:  Blood pressure 140/76, pulse 83, temperature 97.4 F (36.3 C), temperature source Oral, resp. rate 21, SpO2 98 %.  PHYSICAL EXAMINATION:  GENERAL:  74 y.o.-year-old patient lying in the bed with no acute distress.  EYES: Pupils equal, round, reactive to light and accommodation. No scleral icterus. Extraocular muscles intact.  HEENT: Head atraumatic, normocephalic. Oropharynx and nasopharynx clear.  NECK:  Supple, no jugular venous distention. No thyroid enlargement, no tenderness.  LUNGS: Normal breath sounds bilaterally, no wheezing, rales,rhonchi or crepitation. No use of accessory muscles of respiration.  CARDIOVASCULAR: S1, S2 normal. No murmurs, rubs, or gallops.  ABDOMEN: Soft, nontender, nondistended. Bowel sounds present. No organomegaly or mass.  EXTREMITIES: No pedal edema, cyanosis, or clubbing.  NEUROLOGIC: patient has slurred speech and left facial droop.. Muscle strength 5/5 in all extremities. Sensation intact. Gait not checked.  PSYCHIATRIC: The patient is alert and oriented x 3.  SKIN: No rash, lesion, or ulcer.   LABORATORY PANEL:   CBC  Recent Labs Lab 11/26/14 1615  WBC 6.7  HGB 11.7*  HCT 36.4  PLT 317     RADIOLOGY:  Nm Sentinel Node Inj-no Rpt (breast)  12/01/2014   CLINICAL DATA: right breast cancer upper inner quadrant   Sulfur colloid was  injected intradermally by the nuclear medicine  technologist for breast cancer sentinel node localization.    Mm Breast Surgical Specimen  12/01/2014   CLINICAL DATA:  74 year old female status post right lumpectomy  EXAM: SPECIMEN RADIOGRAPH OF THE RIGHT BREAST  COMPARISON:  Previous exam(s).  FINDINGS: Status post excision of the right breast. The wire tip and biopsy marker clip are present within the specimen. Findings were communicated to Dr. Jamal Collin in the operating room at approximately 11:45 a.m. on 12/01/2014.  IMPRESSION: Specimen radiograph of the right breast.   Electronically Signed   By: Pamelia Hoit M.D.   On: 12/01/2014 11:53   Mm Rt Plc Breast Loc Dev   1st Lesion  Inc Mammo Guide  12/01/2014   CLINICAL DATA:  74 year old female for wire localization of known right breast carcinoma and biopsy marker  EXAM: NEEDLE LOCALIZATION OF THE RIGHT BREAST WITH MAMMO GUIDANCE  COMPARISON:  Previous exams.  FINDINGS: Patient presents for needle localization prior to right lumpectomy. I met with the patient and we discussed the procedure of needle  localization including benefits and alternatives. We discussed the high likelihood of a successful procedure. We discussed the risks of the procedure, including infection, bleeding, tissue injury, and further surgery. Informed, written consent was given. The usual time-out protocol was performed immediately prior to the procedure.  Using mammographic guidance, sterile technique, 2% lidocaine and a 7 cm modified Kopans needle, the coil shaped biopsy marker within the upper, inner right breast was localized using a superior to inferior approach. The images were marked for Dr. Jamal Collin.  IMPRESSION: Needle localization of the right breast. No apparent complications.   Electronically Signed   By: Pamelia Hoit M.D.   On: 12/01/2014 09:26    IMPRESSION AND PLAN:   1. Acute CVA likely intraoperatively. Risks include being off her aspirin for a week preoperatively and  intraoperative hypotension. 325 mg of aspirin given stat. Patient was able to swallow water for me. I will get a stat CT scan of the head to rule out bleed. I will get an MRI of the brain, carotid ultrasound and echocardiogram for completeness. Monitor on telemetry. Check a lipid profile in the a.m. Get a speech therapy consultation and physical therapy consultation. Case discussed with Dr. Irish Elders neurology to see the patient also. 2. Essential hypertension - I will allow permissive hypertension at this point holding all the patient's blood pressure medications at this point since the patient did have intraoperative hypotension.  3. Type 2 diabetes- continue Lantus and I will order sliding scale. Hold oral medications at this time. 4. Hyperlipidemia unspecified continue atorvastatin and check a lipid profile in the a.m.  5. Lumpectomy today- Dr. Jamal Collin to follow. Pain control ordered.  All the records are reviewed and case discussed with ED provider. Management plans discussed with the patient, family and they are in agreement.  CODE STATUS: Full code   TOTAL TIME TAKING CARE OF THIS PATIENT: 55  minutes.    Loletha Grayer M.D on 12/01/2014 at 2:33 PM  Between 7am to 6pm - Pager - [phone]  After 6pm call admission pager [facility]  Office  [phone]  CC: Primary care physician; Juluis Pitch, MD

## 2014-12-01 NOTE — Transfer of Care (Signed)
Immediate Anesthesia Transfer of Care Note  Patient: Alexis Greer  Procedure(s) Performed: Procedure(s): BREAST LUMPECTOMY WITH SENTINEL LYMPH NODE BX (Right) BREAST BIOPSY WITH NEEDLE LOCALIZATION (Right)  Patient Location: PACU  Anesthesia Type:General  Level of Consciousness: sedated  Airway & Oxygen Therapy: Patient Spontanous Breathing and Patient connected to face mask oxygen  Post-op Assessment: Report given to RN and Post -op Vital signs reviewed and stable  Post vital signs: Reviewed and stable  Last Vitals:  Filed Vitals:   12/01/14 1220  BP: 111/71  Pulse: 69  Temp: 36.3 C  Resp: 12    Complications: No apparent anesthesia complications

## 2014-12-01 NOTE — Progress Notes (Signed)
Internal doctor called for pt evaluation   Family called back to talk with dr piscitello

## 2014-12-01 NOTE — Anesthesia Preprocedure Evaluation (Addendum)
Anesthesia Evaluation  Patient identified by MRN, date of birth, ID band Patient awake    Reviewed: Allergy & Precautions, H&P , NPO status , Patient's Chart, lab work & pertinent test results  Airway Mallampati: III  TM Distance: >3 FB Neck ROM: limited    Dental  (+) Upper Dentures, Lower Dentures, Poor Dentition, Missing   Pulmonary neg shortness of breath, asthma , sleep apnea , former smoker,  breath sounds clear to auscultation  Pulmonary exam normal       Cardiovascular Exercise Tolerance: Good hypertension, Normal cardiovascular examRhythm:regular Rate:Normal     Neuro/Psych negative neurological ROS  negative psych ROS   GI/Hepatic negative GI ROS, Neg liver ROS,   Endo/Other  diabetes, Type 2  Renal/GU negative Renal ROS  negative genitourinary   Musculoskeletal  (+) Arthritis -,   Abdominal   Peds  Hematology negative hematology ROS (+)   Anesthesia Other Findings Past Medical History:   Diabetes mellitus without complication                       Hypertension                                                 Breast cancer                                   11/17/14        Comment:right breast, triple negative   Asthma                                                       Arthritis                                                    Hyperlipidemia                                              Morbid Obesity BMI 42  Reproductive/Obstetrics negative OB ROS                           Anesthesia Physical Anesthesia Plan  ASA: III  Anesthesia Plan: General ETT   Post-op Pain Management:    Induction:   Airway Management Planned:   Additional Equipment:   Intra-op Plan:   Post-operative Plan:   Informed Consent: I have reviewed the patients History and Physical, chart, labs and discussed the procedure including the risks, benefits and alternatives for the proposed anesthesia  with the patient or authorized representative who has indicated his/her understanding and acceptance.   Dental Advisory Given  Plan Discussed with: Anesthesiologist, CRNA and Surgeon  Anesthesia Plan Comments:        Anesthesia Quick Evaluation

## 2014-12-01 NOTE — H&P (View-Only) (Signed)
Patient ID: Alexis Greer, female   DOB: 08-04-1940, 74 y.o.   MRN: [id number]  Chief Complaint  Patient presents with  . Breast Cancer    HPI Alexis Greer is a 74 y.o. female.  who presents for a breast evaluation. The most recent screening mammogram was done on 11-09-14. She then had additional views, ultrasound and breast biopsy on 11-17-14 showing breast cancer. Patient does perform regular self breast checks and gets regular mammograms done.  She is here today with her daughter and granddaughter.  HPI  Past Medical History  Diagnosis Date  . Diabetes mellitus without complication   . Hypertension   . 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 Asthma   . Arthritis   . Hyperlipidemia     Past Surgical History  Procedure Laterality Date  . Abdominal hysterectomy  1979  . Oophorectomy  2014  . Breast biopsy Right 11/17/2014    Invasive mammory carcinoma    Family History  Problem Relation Age of Onset  . Heart disease Mother   . Lung cancer Sister     smoker    Social History Social History  Substance Use Topics  . Smoking status: Former Smoker    Quit date: 08/25/1993  . Smokeless tobacco: Never Used  . Alcohol Use: No    No Known Allergies  Current Outpatient Prescriptions  Medication Sig Dispense Refill  . allopurinol (ZYLOPRIM) 100 MG tablet Take 200 mg by mouth daily.    Marland Kitchen amLODipine (NORVASC) 10 MG tablet Take 10 mg by mouth daily.    Marland Kitchen aspirin EC 81 MG tablet Take 81 mg by mouth daily.    Marland Kitchen atorvastatin (LIPITOR) 40 MG tablet Take 40 mg by mouth daily.    . carvedilol (COREG) 12.5 MG tablet Take 12.5 mg by mouth 2 (two) times daily.    . chlorthalidone (HYGROTON) 25 MG tablet Take 25 mg by mouth daily.    . Cholecalciferol (D-5000) 5000 UNITS TABS Take by mouth.    . Cyanocobalamin-Salcaprozate (ELIGEN B12) 1000-100 MCG-MG TABS Take by mouth daily.    . ferrous sulfate 325 (65 FE) MG tablet Take 325 mg by mouth daily.    Marland Kitchen  glimepiride (AMARYL) 4 MG tablet Take 4 mg by mouth daily.    . insulin glargine (LANTUS) 100 UNIT/ML injection Inject 10 Units into the skin.    . metFORMIN (GLUCOPHAGE-XR) 500 MG 24 hr tablet Take 500 mg by mouth 2 (two) times daily.    . Multiple Vitamin (MULTI-VITAMINS) TABS Take by mouth.    Marland Kitchen omeprazole (PRILOSEC) 20 MG capsule Take 20 mg by mouth 2 (two) times daily.    . valsartan (DIOVAN) 160 MG tablet Take 160 mg by mouth daily.     No current facility-administered medications for this visit.    Review of Systems Review of Systems  Constitutional: Positive for fatigue.  Respiratory: Positive for wheezing.   Cardiovascular: Negative.     Blood pressure 120/70, pulse 66, resp. rate 14, height 5\' 3"  (1.6 m), weight 240 lb (108.863 kg).  Physical Exam Physical Exam  Constitutional: She is oriented to person, place, and time. She appears well-developed and well-nourished.  HENT:  Mouth/Throat: Oropharynx is clear and moist.  Eyes: Conjunctivae are normal. No scleral icterus.  Neck: Neck supple.  Cardiovascular: Normal rate, regular rhythm and normal heart sounds.   Pulmonary/Chest: Effort normal and breath sounds normal. Right breast exhibits skin change. Right breast exhibits no inverted  nipple, no mass, no nipple discharge and no tenderness. Left breast exhibits no inverted nipple, no mass, no nipple discharge, no skin change and no tenderness.  Mild ecchymosis right breast biopsy site superior at 1 o'clock  Abdominal: Soft. There is no tenderness.  Lymphadenopathy:    She has no cervical adenopathy.    She has no axillary adenopathy.  Neurological: She is alert and oriented to person, place, and time.  Skin: Skin is warm and dry.  Psychiatric: Her behavior is normal.    Data Reviewed Mammogram and [place] reviewed.  Assessment    Invasive mammary carcinoma right breast, triple negative. By [place] she has a 74mm nodule mid depth at 1 ocl right breast    Plan   Detailed  discussion with pt regarding treatment options to include possible chemotherapy after local surgical resection and radiation. Discussed risk and benefits of surgery, right breast lumpectomy with wire localization and SN biopsy. Pt is agreeable. Based on the triple negative results will discuss with oncologist.     PCP:  Rozelle Logan 11/25/2014, 8:07 AM

## 2014-12-01 NOTE — Progress Notes (Signed)
Noted left side of mouth drooping  Speech not totally clear  Dr piscitello and dr Kayleen Memos in to see pt  Hand grips equal and o kay but not super strong

## 2014-12-01 NOTE — Progress Notes (Signed)
To ct scan

## 2014-12-02 ENCOUNTER — Telehealth: Payer: Self-pay | Admitting: *Deleted

## 2014-12-02 DIAGNOSIS — I639 Cerebral infarction, unspecified: Secondary | ICD-10-CM | POA: Diagnosis not present

## 2014-12-02 LAB — LIPID PANEL
CHOLESTEROL: 181 mg/dL (ref 0–200)
HDL: 33 mg/dL — ABNORMAL LOW (ref >40)
LDL Cholesterol: 121 mg/dL — ABNORMAL HIGH (ref 0–99)
TRIGLYCERIDES: 133 mg/dL (ref <150)
Total CHOL/HDL Ratio: 5.5 RATIO
VLDL: 27 mg/dL (ref 0–40)

## 2014-12-02 LAB — CBC
HCT: 32.2 % — ABNORMAL LOW (ref 35.0–47.0)
Hemoglobin: 10.3 g/dL — ABNORMAL LOW (ref 12.0–16.0)
MCH: 29.1 pg (ref 26.0–34.0)
MCHC: 31.9 g/dL — AB (ref 32.0–36.0)
MCV: 91.1 fL (ref 80.0–100.0)
Platelets: 286 10*3/uL (ref 150–440)
RBC: 3.53 MIL/uL — ABNORMAL LOW (ref 3.80–5.20)
RDW: 17.4 % — AB (ref 11.5–14.5)
WBC: 7.2 10*3/uL (ref 3.6–11.0)

## 2014-12-02 LAB — GLUCOSE, CAPILLARY
GLUCOSE-CAPILLARY: 188 mg/dL — AB (ref 65–99)
GLUCOSE-CAPILLARY: 209 mg/dL — AB (ref 65–99)
Glucose-Capillary: 157 mg/dL — ABNORMAL HIGH (ref 65–99)

## 2014-12-02 LAB — BASIC METABOLIC PANEL
Anion gap: 7 (ref 5–15)
BUN: 31 mg/dL — AB (ref 6–20)
CALCIUM: 9.6 mg/dL (ref 8.9–10.3)
CO2: 21 mmol/L — AB (ref 22–32)
Chloride: 111 mmol/L (ref 101–111)
Creatinine, Ser: 1.85 mg/dL — ABNORMAL HIGH (ref 0.44–1.00)
GFR calc Af Amer: 30 mL/min — ABNORMAL LOW (ref >60)
GFR, EST NON AFRICAN AMERICAN: 26 mL/min — AB (ref >60)
GLUCOSE: 164 mg/dL — AB (ref 65–99)
Potassium: 4.1 mmol/L (ref 3.5–5.1)
Sodium: 139 mmol/L (ref 135–145)

## 2014-12-02 MED ORDER — ATORVASTATIN CALCIUM 80 MG PO TABS: 80.0000 mg | ORAL_TABLET | Freq: Every day | ORAL | Status: DC

## 2014-12-02 MED ORDER — NAPROXEN 250 MG PO TABS
250.0000 mg | ORAL_TABLET | Freq: Two times a day (BID) | ORAL | Status: DC | PRN
  Administered 2014-12-02: 250 mg via ORAL
  Filled 2014-12-02 (×2): qty 1

## 2014-12-02 MED ORDER — NAPROXEN 250 MG PO TABS: 250.0000 mg | ORAL_TABLET | Freq: Every evening | ORAL | Status: DC | PRN

## 2014-12-02 MED ORDER — HYDROCODONE-ACETAMINOPHEN 5-325 MG PO TABS
1.0000 | ORAL_TABLET | Freq: Four times a day (QID) | ORAL | Status: DC | PRN
  Administered 2014-12-02: 12:00:00 1 via ORAL
  Filled 2014-12-02: qty 1

## 2014-12-02 MED ORDER — HYDROCODONE-ACETAMINOPHEN 5-325 MG PO TABS: 1.0000 | ORAL_TABLET | Freq: Four times a day (QID) | ORAL | Status: DC | PRN

## 2014-12-02 NOTE — Progress Notes (Signed)
Examination of the patient is found to have a new stroke, we will waiting to have physical therapy evaluation for arranging for some extra help at home if needed. As patient was stopped from her naproxen for procedure for last few days, she is having pain in her joints due to arthritis. Her daughter was present in the room, and she agreed that patient got up with help of the daughter since morning 3-4 times to walk to the bathroom. But now she refuses to participate with physical therapy because of pain in her joints. Patient and her daughter both confirms that she does not have any new weakness- since yesterday's stroke happened. She is feeling confident about the care she has at home currently, she also have some help of her daughters at home. I explained them by not having evaluation by physical therapy, we might miss if she might have a requirement of physical therapy arrangement at home. She understands and still does not want to have evaluation by physical therapy but she feels comfortable going home without that.  I informed them in that situation if after one or 2 days once her pain is better under control, she come to know she has some new weakness then she can contact her primary care physician and he or she can help her to arrange physical therapy at home, or she can come to emergency room. Patient and her daughters agreed with this plan. We will discharge patient home today.

## 2014-12-02 NOTE — Discharge Summary (Addendum)
[facility] NAME: Alexis Greer    MR#:  [id number]  DATE OF BIRTH:  Feb 03, 1941  DATE OF ADMISSION:  12/01/2014 ADMITTING PHYSICIAN: Alexis Lye, MD  DATE OF DISCHARGE: 12/02/2014  PRIMARY CARE PHYSICIAN: Alexis Pitch, MD    ADMISSION DIAGNOSIS:  cancer right breast  Slurred speech- possible stroke  DISCHARGE DIAGNOSIS:  Active Problems:   CVA (cerebral infarction)   SECONDARY DIAGNOSIS:   Past Medical History  Diagnosis Date  . Diabetes mellitus without complication   . Hypertension   . 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 Asthma   . Arthritis   . Hyperlipidemia   . Complication of anesthesia     HOSPITAL COURSE:   1. Acute CVA likely intraoperatively.- due to hypotension abd being off Aspirin   Two areas noted on MRI- right Frontotemporal and left occipital   Carotid doppler and Echo- did not show any source.   Appreciated neurology consult.   Increased her Atorvastatin as her LD was 121. 2. Essential hypertension - I will allow permissive hypertension at this point holding all the patient's blood pressure medications at this point since the patient did have intraoperative hypotension.    Resume her meds on d/c but d/c diovan- as her BP was in range of only 140-150.    Seems like she is on too many hypertensive meds. 3. Type 2 diabetes- continue Lantus , resume oral meds on d/c. 4. Hyperlipidemia - as above- increase atorvastatin dose.  5. Lumpectomy today- Dr. Jamal Greer to follow. Greer control ordered.  DISCHARGE CONDITIONS:   Stable.  CONSULTS OBTAINED:  Treatment Team:  Alexis Pain, MD Alexis Basta, MD  DRUG ALLERGIES:   Allergies  Allergen Reactions  . Oxycodone Other (See Comments)    It made the patient have shakes and jittery     DISCHARGE MEDICATIONS:   Current Discharge Medication List    START taking these medications    Details  HYDROcodone-acetaminophen (NORCO) 5-325 MG per tablet Take 1 tablet by mouth every 6 (six) hours as needed for moderate Greer or severe Greer. Qty: 20 tablet, Refills: 0    naproxen (NAPROSYN) 250 MG tablet Take 1 tablet (250 mg total) by mouth at bedtime as needed for moderate Greer. Qty: 30 tablet, Refills: 0      CONTINUE these medications which have CHANGED   Details  atorvastatin (LIPITOR) 80 MG tablet Take 1 tablet (80 mg total) by mouth daily. Qty: 30 tablet, Refills: 0      CONTINUE these medications which have NOT CHANGED   Details  allopurinol (ZYLOPRIM) 100 MG tablet Take 200 mg by mouth daily.    amLODipine (NORVASC) 10 MG tablet Take 10 mg by mouth at bedtime.     aspirin EC 81 MG tablet Take 81 mg by mouth daily.    carvedilol (COREG) 12.5 MG tablet Take 12.5 mg by mouth 2 (two) times daily.    chlorthalidone (HYGROTON) 25 MG tablet Take 25 mg by mouth every morning.     Cholecalciferol (D-5000) 5000 UNITS TABS Take 5,000 Units by mouth every morning.     Cyanocobalamin-Salcaprozate (ELIGEN B12) 1000-100 MCG-MG TABS Take by mouth daily.    ferrous sulfate 325 (65 FE) MG tablet Take 325 mg by mouth daily.    glimepiride (AMARYL) 4 MG tablet Take 2 mg by mouth daily.     insulin glargine (LANTUS) 100 UNIT/ML injection Inject 10 Units into  the skin every morning.     metFORMIN (GLUCOPHAGE-XR) 500 MG 24 hr tablet Take 500 mg by mouth 2 (two) times daily.    Multiple Vitamin (MULTI-VITAMINS) TABS Take by mouth.    omeprazole (PRILOSEC) 20 MG capsule Take 40 mg by mouth every morning.       STOP taking these medications     valsartan (DIOVAN) 160 MG tablet          DISCHARGE INSTRUCTIONS:    Follow with PMD in 1 week.  If you experience worsening of your admission symptoms, develop shortness of breath, life threatening emergency, suicidal or homicidal thoughts you must seek medical attention immediately by calling 911 or calling your MD  immediately  if symptoms less severe.  You Must read complete instructions/literature along with all the possible adverse reactions/side effects for all the Medicines you take and that have been prescribed to you. Take any new Medicines after you have completely understood and accept all the possible adverse reactions/side effects.   Please note  You were cared for by a hospitalist during your hospital stay. If you have any questions about your discharge medications or the care you received while you were in the hospital after you are discharged, you can call the unit and asked to speak with the hospitalist on call if the hospitalist that took care of you is not available. Once you are discharged, your primary care physician will handle any further medical issues. Please note that NO REFILLS for any discharge medications will be authorized once you are discharged, as it is imperative that you return to your primary care physician (or establish a relationship with a primary care physician if you do not have one) for your aftercare needs so that they can reassess your need for medications and monitor your lab values.    Today   CHIEF COMPLAINT:  No chief complaint on file.   HISTORY OF PRESENT ILLNESS:  Alexis Greer  is a 74 y.o. female with a known history of diabetes, hypertension, hyperlipidemia, breast cancer. She went for a lumpectomy on the right side today by Dr. Jamal Greer. Postoperatively she was found with a left-sided facial droop and slurred speech. Nursing staff also noticed weakness on the right side. The patient has no history of prior stroke. Intraoperatively the patient did have some brief hypotension. The patient's major complaint was soreness in the breast. The patient states that she has some slurred speech. Does not feel weak on one side versus the other. In the PACU she was able to swallow some water for me.  VITAL SIGNS:  Blood pressure 145/69, pulse 52, temperature 98.8 F  (37.1 C), temperature source Oral, resp. rate 18, height _0  (1.575 m), weight 112.946 kg (249 lb), SpO2 93 %.  I/O:    Intake/Output Summary (Last 24 hours) at 12/02/14 1634 Last data filed at 12/02/14 1500  Gross per 24 hour  Intake      0 ml  Output      0 ml  Net      0 ml    PHYSICAL EXAMINATION:   GENERAL: 74 y.o.-year-old patient lying in the bed with no acute distress.  EYES: Pupils equal, round, reactive to light and accommodation. No scleral icterus. Extraocular muscles intact.  HEENT: Head atraumatic, normocephalic. Oropharynx and nasopharynx clear.  NECK: Supple, no jugular venous distention. No thyroid enlargement, no tenderness.  LUNGS: Normal breath sounds bilaterally, no wheezing, rales,rhonchi or crepitation. No use of accessory muscles of respiration.  CARDIOVASCULAR: S1, S2 normal. No murmurs, rubs, or gallops.  ABDOMEN: Soft, nontender, nondistended. Bowel sounds present. No organomegaly or mass.  EXTREMITIES: No pedal edema, cyanosis, or clubbing.  NEUROLOGIC: patient has slurred speech and left facial droop.. Muscle strength 5/5 in all extremities. Sensation intact. Gait not checked.  PSYCHIATRIC: The patient is alert and oriented x 3.  SKIN: No rash, lesion, or ulcer.   DATA REVIEW:   CBC  Recent Labs Lab 12/02/14 0537  WBC 7.2  HGB 10.3*  HCT 32.2*  PLT 286    Chemistries   Recent Labs Lab 12/02/14 0537  NA 139  K 4.1  CL 111  CO2 21*  GLUCOSE 164*  BUN 31*  CREATININE 1.85*  CALCIUM 9.6    Cardiac Enzymes No results for input(s): TROPONINI in the last 168 hours.  Microbiology Results  No results found for this or any previous visit.  RADIOLOGY:  Ct Head Wo Contrast  12/01/2014   CLINICAL DATA:  Altered speech and left facial droop. Bilateral grip weakness.  EXAM: CT HEAD WITHOUT CONTRAST  TECHNIQUE: Contiguous axial images were obtained from the base of the skull through the vertex without intravenous contrast.   COMPARISON:  None.  FINDINGS: There is no evidence of acute intracranial abnormality including hemorrhage, infarct, mass lesion, mass effect, midline shift or abnormal extra-axial fluid collection. No hydrocephalus or pneumocephalus. Imaged paranasal sinuses and mastoid air cells are clear.  IMPRESSION: Negative exam.   Electronically Signed   By: Inge Rise M.D.   On: 12/01/2014 14:40   Mr Brain Wo Contrast  12/01/2014   CLINICAL DATA:  Patient developed left-sided facial droop and slurred speech after surgery earlier today, also reportedly developed right-sided weakness. Intraoperative hypotension is reported.  EXAM: MRI HEAD WITHOUT CONTRAST  TECHNIQUE: Multiplanar, multiecho pulse sequences of the brain and surrounding structures were obtained without intravenous contrast.  COMPARISON:  CT head earlier today.  FINDINGS: The patient was unable to remain motionless for the exam. Small or subtle lesions could be overlooked.  Restricted diffusion affects a moderate-sized area of the RIGHT frontal cortex, and RIGHT temporal cortex, as well as associated subcortical white matter, consistent with acute infarction. A second smaller area of acute infarction affects the LEFT occipital cortex and subcortical white matter. No brainstem or cerebellar involvement.  Generalized atrophy. Mild chronic microvascular ischemic change affects the periventricular and subcortical white matter, and also the pons. No acute or chronic hemorrhage. Flow voids are maintained in the carotid, basilar, and vertebral arteries. Extracranial soft tissues are unremarkable.  Compared with the recent CT, the infarcts are not visible.  IMPRESSION: Acute RIGHT frontotemporal infarct. Second area of acute LEFT occipital infarct is noted.  No complicating hemorrhage.  No visible vascular occlusion.   Electronically Signed   By: Staci Righter M.D.   On: 12/01/2014 17:46   US Carotid Bilateral  12/02/2014   CLINICAL DATA:  Acute right-sided  cerebral infarction. History of hypertension, hyperlipidemia and diabetes.  EXAM: BILATERAL CAROTID DUPLEX ULTRASOUND  TECHNIQUE: Pearline Cables scale imaging, color Doppler and duplex ultrasound were performed of bilateral carotid and vertebral arteries in the neck.  COMPARISON:  None.  FINDINGS: Criteria: Quantification of carotid stenosis is based on velocity parameters that correlate the residual internal carotid diameter with NASCET-based stenosis levels, using the diameter of the distal internal carotid lumen as the denominator for stenosis measurement.  The following velocity measurements were obtained:  RIGHT  ICA:  75/19 cm/sec  CCA:  11/91 cm/sec  SYSTOLIC  ICA/CCA RATIO:  0.9  DIASTOLIC ICA/CCA RATIO:  1.1  ECA:  71 cm/sec  LEFT  ICA:  73/26 cm/sec  CCA:  697/94 cm/sec  SYSTOLIC ICA/CCA RATIO:  0.7  DIASTOLIC ICA/CCA RATIO:  1.3  ECA:  75 cm/sec  RIGHT CAROTID ARTERY: No focal plaque is identified. Velocities and waveforms are within normal limits. There is no evidence of right carotid stenosis in the neck.  RIGHT VERTEBRAL ARTERY: Antegrade flow with normal waveform and velocity.  LEFT CAROTID ARTERY: No focal plaque is identified. Velocities and waveforms are normal. There is no evidence of left carotid stenosis in the neck.  LEFT VERTEBRAL ARTERY: Antegrade flow with normal waveform and velocity.  IMPRESSION: Normal carotid duplex ultrasound demonstrating no evidence of carotid stenosis or focal plaque.   Electronically Signed   By: Aletta Edouard M.D.   On: 12/02/2014 08:03   Nm Sentinel Node Inj-no Rpt (breast)  12/01/2014   CLINICAL DATA: right breast cancer upper inner quadrant   Sulfur colloid was injected intradermally by the nuclear medicine  technologist for breast cancer sentinel node localization.    Mm Breast Surgical Specimen  12/01/2014   CLINICAL DATA:  74 year old female status post right lumpectomy  EXAM: SPECIMEN RADIOGRAPH OF THE RIGHT BREAST  COMPARISON:  Previous exam(s).  FINDINGS:  Status post excision of the right breast. The wire tip and biopsy marker clip are present within the specimen. Findings were communicated to Dr. Jamal Greer in the operating room at approximately 11:45 a.m. on 12/01/2014.  IMPRESSION: Specimen radiograph of the right breast.   Electronically Signed   By: Pamelia Hoit M.D.   On: 12/01/2014 11:53   Mm Rt Plc Breast Loc Dev   1st Lesion  Inc Mammo Guide  12/01/2014   CLINICAL DATA:  74 year old female for wire localization of known right breast carcinoma and biopsy marker  EXAM: NEEDLE LOCALIZATION OF THE RIGHT BREAST WITH MAMMO GUIDANCE  COMPARISON:  Previous exams.  FINDINGS: Patient presents for needle localization prior to right lumpectomy. I met with the patient and we discussed the procedure of needle localization including benefits and alternatives. We discussed the high likelihood of a successful procedure. We discussed the risks of the procedure, including infection, bleeding, tissue injury, and further surgery. Informed, written consent was given. The usual time-out protocol was performed immediately prior to the procedure.  Using mammographic guidance, sterile technique, 2% lidocaine and a 7 cm modified Kopans needle, the coil shaped biopsy marker within the upper, inner right breast was localized using a superior to inferior approach. The images were marked for Dr. Jamal Greer.  IMPRESSION: Needle localization of the right breast. No apparent complications.   Electronically Signed   By: Pamelia Hoit M.D.   On: 12/01/2014 09:26    Management plans discussed with the patient, family and they are in agreement.  CODE STATUS:     Code Status Orders        Start     Ordered   12/01/14 1404  Full code   Continuous     12/01/14 1405      TOTAL TIME TAKING CARE OF THIS PATIENT: 35 minutes.    Alexis Greer M.D on 12/02/2014 at 4:34 PM  Between 7am to 6pm - Pager - [phone]  After 6pm go to [email or web address] - password [email or web address] [facility]  Office  [phone]  CC: Primary care physician; Alexis Pitch, MD

## 2014-12-02 NOTE — Progress Notes (Signed)
PT Cancellation Note  Patient Details Name: Alexis Greer MRN: [id number] DOB: January 22, 1941   Cancelled Treatment:    Reason Eval/Treat Not Completed: Pain limiting ability to participate.  PT received call from nursing that pt had pain medication and therapist given optimal time to come see pt.  PT came to see pt during that time and pt refusing to get OOB d/t pain in her knee and ankle joints (pt has this pain chronically d/t arthritis but is usually managed by her pain medication; pt has not had the usual pain medication d/t recent surgery and reports it will take "3-4 days" to get pain under control again).  Pt and pt's daughter report pt had walked to the bathroom this morning multiple times with assist and needed a w/c the last 2 times d/t significant knee and ankle pain L>R.  Educated pt and pt's family that therapist was unable to make recommendations for therapy services or equipment if pt did not get OOB for PT assessment: both pt and pt's family report understanding (pt continued to refuse OOB mobility).  Pt's daughter reports that she feels like she can manage pt at home at her current level but will contact pt's PCP if pt does not return back to baseline level of functioning. Care management and nursing notified of above.  Plan for pt to discharge home today (pt's daughter reports she will stay with pt initially on discharge to assist as needed).   Raquel Sarna Azel Gumina 12/02/2014, 4:48 PM [name], [place]

## 2014-12-02 NOTE — Anesthesia Postprocedure Evaluation (Signed)
  Anesthesia Post-op Note  Patient: Alexis Greer  Procedure(s) Performed: Procedure(s): BREAST LUMPECTOMY WITH SENTINEL LYMPH NODE BX (Right) BREAST BIOPSY WITH NEEDLE LOCALIZATION (Right)  Anesthesia type:General ETT  Patient location: Floor  Post pain: Pain level controlled  Post assessment: Post-op Vital signs reviewed, Patient's Cardiovascular Status Stable, Respiratory Function Stable, Patent Airway and No signs of Nausea or vomiting  Post vital signs: Reviewed and stable  Last Vitals:  Filed Vitals:   12/02/14 0824  BP: 141/79  Pulse: 93  Temp: 36.7 C  Resp:     Level of consciousness: awake, alert  and patient cooperative  Complications: Stroke vs TIA.  Patient's smile and verbal enunciation are almost back to baseline at this time.

## 2014-12-02 NOTE — Care Management (Signed)
Admitted to [facility] with the diagnosis of CVA. Lives alone. Daughters are Jackelyn Poling [phone]) and Bertram Millard [phone]). Seen Dr. Lovie Macadamia last July. No home health. No skilled nursing. No home oxygen. Uses a cane to aid in ambulation. Takes care of both basic and instrumental activities of daily living, still drives. No falls. Good appetite, if it's good food.  Lumpectomy 12/01/14 Ancef  IV. Speech and physical therapy evaluation pending. Shelbie Ammons RN MSN Care management [phone]

## 2014-12-02 NOTE — Consult Note (Signed)
CC: dysarthria and L facial droop  HPI: Alexis Greer is an 74 y.o. female  with a known historyf of diabetes, hypertension, hyperlipidemia, breast cancer. She went for a lumpectomy on the right side yesterday . Postoperatively she was found with a left-sided facial droop and slurred speech. Nursing staff also noticed weakness on the right side.  MRI pt was found to have R fronto temp infarct and small L occipital infarct.   Currently back to baseline   Past Medical History  Diagnosis Date  . Diabetes mellitus without complication   . Hypertension   . 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 Asthma   . Arthritis   . Hyperlipidemia   . Complication of anesthesia     Past Surgical History  Procedure Laterality Date  . Abdominal hysterectomy  1979  . Oophorectomy  2014  . Breast biopsy Right 11/17/2014    Invasive mammory carcinoma    Family History  Problem Relation Age of Onset  . Heart disease Mother   . Lung cancer Sister     smoker  . CAD Father   . Hyperlipidemia Father     Social History:  reports that she quit smoking about 21 years ago. She has never used smokeless tobacco. She reports that she does not drink alcohol or use illicit drugs.  No Known Allergies  Medications: I have reviewed the patient's current medications.  ROS: History obtained from the patient  General ROS: negative for - chills, fatigue, fever, night sweats, weight gain or weight loss Psychological ROS: negative for - behavioral disorder, hallucinations, memory difficulties, mood swings or suicidal ideation Ophthalmic ROS: negative for - blurry vision, double vision, eye pain or loss of vision ENT ROS: negative for - epistaxis, nasal discharge, oral lesions, sore throat, tinnitus or vertigo Allergy and Immunology ROS: negative for - hives or itchy/watery eyes Hematological and Lymphatic ROS: negative for - bleeding problems, bruising or swollen lymph nodes Endocrine  ROS: negative for - galactorrhea, hair pattern changes, polydipsia/polyuria or temperature intolerance Respiratory ROS: negative for - cough, hemoptysis, shortness of breath or wheezing Cardiovascular ROS: negative for - chest pain, dyspnea on exertion, edema or irregular heartbeat Gastrointestinal ROS: negative for - abdominal pain, diarrhea, hematemesis, nausea/vomiting or stool incontinence Genito-Urinary ROS: negative for - dysuria, hematuria, incontinence or urinary frequency/urgency Musculoskeletal ROS: negative for - joint swelling or muscular weakness Neurological ROS: as noted in HPI Dermatological ROS: negative for rash and skin lesion changes  Physical Examination: Blood pressure 146/70, pulse 94, temperature 98.2 F (36.8 C), temperature source Oral, resp. rate 18, height _0  (1.575 m), weight 112.946 kg (249 lb), SpO2 94 %.   Neurological Examination Mental Status: Alert, oriented, thought content appropriate.  Speech fluent without evidence of aphasia.  Able to follow 3 step commands without difficulty. Cranial Nerves: OK:[id number] speech.  III,IV, VI: ptosis not present, extra-ocular motions intact bilaterally V,VII: L facial droop VIII: hearing normal bilaterally IX,X: gag reflex present XI: bilateral shoulder shrug XII: midline tongue extension Motor: Right : Upper extremity   5/5    Left:     Upper extremity   5/5  Lower extremity   5/5     Lower extremity   5/5 Tone and bulk:normal tone throughout; no atrophy noted Sensory: Pinprick and light touch intact throughout, bilaterally Deep Tendon Reflexes: 1+ and symmetric throughout Plantars: Right: downgoing   Left: downgoing Cerebellar: normal finger-to-nose, normal rapid alternating movements and normal heel-to-shin test Gait:  not tested      Laboratory Studies:   Basic Metabolic Panel:  Recent Labs Lab 12/02/14 0537  NA 139  K 4.1  CL 111  CO2 21*  GLUCOSE 164*  BUN 31*  CREATININE 1.85*   CALCIUM 9.6    Liver Function Tests: No results for input(s): AST, ALT, ALKPHOS, BILITOT, PROT, ALBUMIN in the last 168 hours. No results for input(s): LIPASE, AMYLASE in the last 168 hours. No results for input(s): AMMONIA in the last 168 hours.  CBC:  Recent Labs Lab 11/26/14 1615 12/02/14 0537  WBC 6.7 7.2  HGB 11.7* 10.3*  HCT 36.4 32.2*  MCV 92.6 91.1  PLT 317 286    Cardiac Enzymes: No results for input(s): CKTOTAL, CKMB, CKMBINDEX, TROPONINI in the last 168 hours.  BNP: Invalid input(s): POCBNP  CBG:  Recent Labs Lab 12/01/14 0920 12/01/14 1827 12/01/14 2123 12/02/14 0720  GLUCAP 176* 153* 179* 157*    Microbiology: No results found for this or any previous visit.  Coagulation Studies: No results for input(s): LABPROT, INR in the last 72 hours.  Urinalysis: No results for input(s): COLORURINE, LABSPEC, PHURINE, GLUCOSEU, HGBUR, BILIRUBINUR, KETONESUR, PROTEINUR, UROBILINOGEN, NITRITE, LEUKOCYTESUR in the last 168 hours.  Invalid input(s): APPERANCEUR  Lipid Panel:     Component Value Date/Time   CHOL 181 12/02/2014 0537   TRIG 133 12/02/2014 0537   HDL 33* 12/02/2014 0537   CHOLHDL 5.5 12/02/2014 0537   VLDL 27 12/02/2014 0537   LDLCALC 121* 12/02/2014 0537    HgbA1C: No results found for: HGBA1C  Urine Drug Screen:  No results found for: LABOPIA, COCAINSCRNUR, LABBENZ, AMPHETMU, THCU, LABBARB  Alcohol Level: No results for input(s): ETH in the last 168 hours.  Other results: EKG: normal EKG, normal sinus rhythm, unchanged from previous tracings.  Imaging: Ct Head Wo Contrast  12/01/2014   CLINICAL DATA:  Altered speech and left facial droop. Bilateral grip weakness.  EXAM: CT HEAD WITHOUT CONTRAST  TECHNIQUE: Contiguous axial images were obtained from the base of the skull through the vertex without intravenous contrast.  COMPARISON:  None.  FINDINGS: There is no evidence of acute intracranial abnormality including hemorrhage, infarct,  mass lesion, mass effect, midline shift or abnormal extra-axial fluid collection. No hydrocephalus or pneumocephalus. Imaged paranasal sinuses and mastoid air cells are clear.  IMPRESSION: Negative exam.   Electronically Signed   By: Inge Rise M.D.   On: 12/01/2014 14:40   Mr Brain Wo Contrast  12/01/2014   CLINICAL DATA:  Patient developed left-sided facial droop and slurred speech after surgery earlier today, also reportedly developed right-sided weakness. Intraoperative hypotension is reported.  EXAM: MRI HEAD WITHOUT CONTRAST  TECHNIQUE: Multiplanar, multiecho pulse sequences of the brain and surrounding structures were obtained without intravenous contrast.  COMPARISON:  CT head earlier today.  FINDINGS: The patient was unable to remain motionless for the exam. Small or subtle lesions could be overlooked.  Restricted diffusion affects a moderate-sized area of the RIGHT frontal cortex, and RIGHT temporal cortex, as well as associated subcortical white matter, consistent with acute infarction. A second smaller area of acute infarction affects the LEFT occipital cortex and subcortical white matter. No brainstem or cerebellar involvement.  Generalized atrophy. Mild chronic microvascular ischemic change affects the periventricular and subcortical white matter, and also the pons. No acute or chronic hemorrhage. Flow voids are maintained in the carotid, basilar, and vertebral arteries. Extracranial soft tissues are unremarkable.  Compared with the recent CT, the infarcts are not visible.  IMPRESSION: Acute RIGHT frontotemporal infarct. Second area of acute LEFT occipital infarct is noted.  No complicating hemorrhage.  No visible vascular occlusion.   Electronically Signed   By: Staci Righter M.D.   On: 12/01/2014 17:46   Nm Sentinel Node Inj-no Rpt (breast)  12/01/2014   CLINICAL DATA: right breast cancer upper inner quadrant   Sulfur colloid was injected intradermally by the nuclear medicine  technologist  for breast cancer sentinel node localization.    Mm Breast Surgical Specimen  12/01/2014   CLINICAL DATA:  74 year old female status post right lumpectomy  EXAM: SPECIMEN RADIOGRAPH OF THE RIGHT BREAST  COMPARISON:  Previous exam(s).  FINDINGS: Status post excision of the right breast. The wire tip and biopsy marker clip are present within the specimen. Findings were communicated to Dr. Jamal Collin in the operating room at approximately 11:45 a.m. on 12/01/2014.  IMPRESSION: Specimen radiograph of the right breast.   Electronically Signed   By: Pamelia Hoit M.D.   On: 12/01/2014 11:53   Mm Rt Plc Breast Loc Dev   1st Lesion  Inc Mammo Guide  12/01/2014   CLINICAL DATA:  74 year old female for wire localization of known right breast carcinoma and biopsy marker  EXAM: NEEDLE LOCALIZATION OF THE RIGHT BREAST WITH MAMMO GUIDANCE  COMPARISON:  Previous exams.  FINDINGS: Patient presents for needle localization prior to right lumpectomy. I met with the patient and we discussed the procedure of needle localization including benefits and alternatives. We discussed the high likelihood of a successful procedure. We discussed the risks of the procedure, including infection, bleeding, tissue injury, and further surgery. Informed, written consent was given. The usual time-out protocol was performed immediately prior to the procedure.  Using mammographic guidance, sterile technique, 2% lidocaine and a 7 cm modified Kopans needle, the coil shaped biopsy marker within the upper, inner right breast was localized using a superior to inferior approach. The images were marked for Dr. Jamal Collin.  IMPRESSION: Needle localization of the right breast. No apparent complications.   Electronically Signed   By: Pamelia Hoit M.D.   On: 12/01/2014 09:26     Assessment/Plan:  74 y.o. female  with a known history of diabetes, hypertension, hyperlipidemia, breast cancer. She went for a lumpectomy on the right side yesterday . Postoperatively she was  found with a left-sided facial droop and slurred speech. Nursing staff also noticed weakness on the right side.  MRI pt was found to have R fronto temp infarct and small L occipital infarct.   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   12/02/2014, 7:31 AM

## 2014-12-02 NOTE — Progress Notes (Signed)
PT Cancellation Note  Patient Details Name: Alexis Greer MRN: [id number] DOB: 1940-06-26   Cancelled Treatment:    Reason Eval/Treat Not Completed: Pain limiting ability to participate (Nursing reporting pt having a lot of pain and trying to get order for pain medication (recommending holding PT until this afternoon).)  Will re-attempt PT at a later date/time as able.   [name] 12/02/2014, 11:53 AM [name], [place]

## 2014-12-02 NOTE — Progress Notes (Addendum)
Patient c/o leg and lower back pain x2 relieved with prn Naproxen and norco  NIH score of 1 from only slight dysarthria  Family at bedside Patient refused to work with PT as see had been up and back to bathroom several times and wanted to go home  Received MD order to to discharge patient to home, discharge instructions prescriptions and stroke education reviewed with patient and family and all verbalized understanding  Discharged in wheelchair with daughter to home

## 2014-12-02 NOTE — Telephone Encounter (Signed)
Patient had breast surgery yesterday.  Follow-up call.  Left message for her to return my call.

## 2014-12-02 NOTE — Plan of Care (Signed)
Problem: Discharge/Transitional Outcomes Goal: Other Discharge Outcomes/Goals Outcome: Progressing Plan of care progress to goal: VSS Family at bedside aiding in pt care Family awaiting notification from MD r/t pt condition

## 2014-12-02 NOTE — Progress Notes (Signed)
Speech Therapy Note: received order, reviewed chart notes and consulted NSG/MD re: pt's status. Met w/ pt and Dtrs in room who denied any trouble swallowing. Pt stated she was eating/drinking at meals and taking meds appropriately w/ water(NSG agreed). Pt and Dtrs also denied any speech-language deficits and indicated pt was talking w/ the family in conversation w/out difficulty. Family did note a slight "slurriness" of speech earlier this morning but it was resolved at this time.  No skilled evaluation indicated at this time. ST will be available for any further services if needed. Pt/Dtrs agreed; NSG updated.

## 2014-12-03 ENCOUNTER — Encounter: Payer: Self-pay | Admitting: General Surgery

## 2014-12-03 LAB — SURGICAL PATHOLOGY

## 2014-12-07 ENCOUNTER — Encounter: Payer: Self-pay | Admitting: *Deleted

## 2014-12-07 NOTE — Progress Notes (Signed)
  Oncology Nurse Navigator Documentation    Navigator Encounter Type: Telephone (12/07/14 1600) Patient Visit Type: Follow-up (12/07/14 1600)   Barriers/Navigation Needs: No barriers at this time (12/07/14 1600)      Patient states she is doing well since her surgery.  States "I had 2 mini strokes during surgery".  Has a follow -up with Dr. Jamal Collin tomorrow.  She is to call with any questions or needs.          Time Spent with Patient: 15 (12/07/14 1600)

## 2014-12-08 ENCOUNTER — Telehealth: Payer: Self-pay

## 2014-12-08 ENCOUNTER — Encounter: Payer: Self-pay | Admitting: General Surgery

## 2014-12-08 ENCOUNTER — Ambulatory Visit: Payer: Commercial Managed Care - HMO | Admitting: General Surgery

## 2014-12-08 ENCOUNTER — Ambulatory Visit (INDEPENDENT_AMBULATORY_CARE_PROVIDER_SITE_OTHER): Payer: Commercial Managed Care - HMO | Admitting: General Surgery

## 2014-12-08 VITALS — BP 132/76 | HR 86 | Resp 18 | Ht 63.0 in | Wt 239.0 lb

## 2014-12-08 DIAGNOSIS — C50211 Malignant neoplasm of upper-inner quadrant of right female breast: Secondary | ICD-10-CM

## 2014-12-08 NOTE — Telephone Encounter (Signed)
Spoke with Ermalinda Barrios at Dr Reuel Boom office and informed them of a referral for the patient to see Dr Baruch Gouty on 12/10/14 at 9:00 am. She will have their office sent in the [place] referral for this visit.

## 2014-12-08 NOTE — Progress Notes (Signed)
74 year old female here today for follow up from right breast lumpectomy + SLN Bx done on 12/01/14. Patient had a stroke during the surgery. This involve left facial droop and some slurring-cleard in less than 24 hrs. Carotid doppler was normal. MRI did show a small infarct.  She feels she is improving, She has a pulling sensation when she lifts her right arm.  Right breast lumpectomy and axillry incisions are clean and healing Path- no residual cancer in lumpectomy, node negative. T 1b, N0. She would be a candidate for Mammosite. Explained this in full to pt. Appt made for radiation oncologist-Dr. Chrystal    Patient is scheduled to see Dr Baruch Gouty at [facility] on 12/10/14 at 9:00 am. Patient is aware of date and time.

## 2014-12-08 NOTE — Patient Instructions (Signed)
Patient is scheduled to see Dr Baruch Gouty at [facility] on 12/10/14 at 9:00 am. Patient is aware of date and time.

## 2014-12-09 ENCOUNTER — Encounter: Payer: Self-pay | Admitting: General Surgery

## 2014-12-10 ENCOUNTER — Telehealth: Payer: Self-pay | Admitting: *Deleted

## 2014-12-10 ENCOUNTER — Encounter: Payer: Self-pay | Admitting: Radiation Oncology

## 2014-12-10 ENCOUNTER — Ambulatory Visit
Admission: RE | Admit: 2014-12-10 | Discharge: 2014-12-10 | Disposition: A | Discharge status: Home / Self Care | Payer: Commercial Managed Care - HMO | Source: Ambulatory Visit | Attending: Radiation Oncology | Admitting: Radiation Oncology

## 2014-12-10 VITALS — BP 131/85 | HR 80 | Temp 98.0°F | Ht 63.0 in | Wt 237.5 lb

## 2014-12-10 DIAGNOSIS — Z79899 Other long term (current) drug therapy: Secondary | ICD-10-CM | POA: Insufficient documentation

## 2014-12-10 DIAGNOSIS — E119 Type 2 diabetes mellitus without complications: Secondary | ICD-10-CM | POA: Insufficient documentation

## 2014-12-10 DIAGNOSIS — C50211 Malignant neoplasm of upper-inner quadrant of right female breast: Secondary | ICD-10-CM | POA: Diagnosis not present

## 2014-12-10 DIAGNOSIS — Z791 Long term (current) use of non-steroidal anti-inflammatories (NSAID): Secondary | ICD-10-CM | POA: Insufficient documentation

## 2014-12-10 DIAGNOSIS — E785 Hyperlipidemia, unspecified: Secondary | ICD-10-CM | POA: Insufficient documentation

## 2014-12-10 DIAGNOSIS — Z171 Estrogen receptor negative status [ER-]: Secondary | ICD-10-CM | POA: Insufficient documentation

## 2014-12-10 DIAGNOSIS — C50919 Malignant neoplasm of unspecified site of unspecified female breast: Secondary | ICD-10-CM | POA: Diagnosis not present

## 2014-12-10 DIAGNOSIS — C50411 Malignant neoplasm of upper-outer quadrant of right female breast: Secondary | ICD-10-CM | POA: Insufficient documentation

## 2014-12-10 DIAGNOSIS — Z87891 Personal history of nicotine dependence: Secondary | ICD-10-CM | POA: Insufficient documentation

## 2014-12-10 DIAGNOSIS — I10 Essential (primary) hypertension: Secondary | ICD-10-CM | POA: Insufficient documentation

## 2014-12-10 DIAGNOSIS — Z510 Encounter for antineoplastic radiation therapy: Secondary | ICD-10-CM | POA: Insufficient documentation

## 2014-12-10 DIAGNOSIS — Z801 Family history of malignant neoplasm of trachea, bronchus and lung: Secondary | ICD-10-CM | POA: Insufficient documentation

## 2014-12-10 DIAGNOSIS — Z794 Long term (current) use of insulin: Secondary | ICD-10-CM | POA: Insufficient documentation

## 2014-12-10 DIAGNOSIS — Z6841 Body Mass Index (BMI) 40.0 and over, adult: Secondary | ICD-10-CM | POA: Insufficient documentation

## 2014-12-10 DIAGNOSIS — Z8673 Personal history of transient ischemic attack (TIA), and cerebral infarction without residual deficits: Secondary | ICD-10-CM | POA: Insufficient documentation

## 2014-12-10 DIAGNOSIS — Z7982 Long term (current) use of aspirin: Secondary | ICD-10-CM | POA: Insufficient documentation

## 2014-12-10 MED ORDER — CEFADROXIL 500 MG PO CAPS: 500.0000 mg | ORAL_CAPSULE | Freq: Two times a day (BID) | ORAL | Status: DC

## 2014-12-10 NOTE — Consult Note (Signed)
Except an outstanding is perfect of Radiation Oncology NEW PATIENT EVALUATION  Name: Alexis Greer  MRN: [id number]  Date:   12/10/2014     DOB: [birth year]   This 74 y.o. female patient presents to the clinic for initial evaluation of stage I (T1 N0 M0) invasive mammary carcinoma of the right breast status post wide local excision and sentinel node biopsy triple negative lesion.  REFERRING PHYSICIAN: Juluis Pitch, MD  CHIEF COMPLAINT:  Chief Complaint  Patient presents with  . Breast Cancer    Initial eval  possible mammosite    DIAGNOSIS: The encounter diagnosis was Malignant neoplasm of female breast, unspecified laterality.   PREVIOUS INVESTIGATIONS:  Mammograms and ultrasound reviewed Clinical notes reviewed Pathology report reviewed Case presented at breast cancer conference  HPI: Patient is a 74 year old female who presented with an abnormal mammogram of her right breast showing a 9 x 7 mm irregular mass at 1:00 position 8 cm from the nipple. This was suspicious for malignancy and underwent biopsy which was positive for triple negative invasive mammary carcinoma. She went on to have a wide local excision showing no residual carcinoma in the breast. Sentinel lymph node biopsy showed single sentinel lymph node negative for metastatic disease. Her postop course was complicated by a small CVA resulting in some slurred speech and facial droop which has resolved nicely. She is been present at our weekly tumor breast conference and recommendation for adjuvant radiation therapy was made. She is seen today for consideration of treatment. She is doing well she specifically denies breast tenderness cough or bone pain.  PLANNED TREATMENT REGIMEN: Accelerated partial breast irradiation  PAST MEDICAL HISTORY:  has a past medical history of Diabetes mellitus without complication; Hypertension; Breast cancer (11/17/14); Asthma; Arthritis; Hyperlipidemia; and Complication of anesthesia.     PAST SURGICAL HISTORY:  Past Surgical History  Procedure Laterality Date  . Abdominal hysterectomy  1979  . Oophorectomy  2014  . Breast biopsy Right 11/17/2014    Invasive mammory carcinoma  . Breast lumpectomy with sentinel lymph node biopsy Right 12/01/2014    Procedure: BREAST LUMPECTOMY WITH SENTINEL LYMPH NODE BX;  Surgeon: Christene Lye, MD;  Location: [facility] ORS;  Service: General;  Laterality: Right;  . Breast biopsy Right 12/01/2014    Procedure: BREAST BIOPSY WITH NEEDLE LOCALIZATION;  Surgeon: Christene Lye, MD;  Location: [facility] ORS;  Service: General;  Laterality: Right;    FAMILY HISTORY: family history includes CAD in her father; Heart disease in her mother; Hyperlipidemia in her father; Lung cancer in her sister.  SOCIAL HISTORY:  reports that she quit smoking about 21 years ago. She has never used smokeless tobacco. She reports that she does not drink alcohol or use illicit drugs.  ALLERGIES: Oxycodone  MEDICATIONS:  Current Outpatient Prescriptions  Medication Sig Dispense Refill  . allopurinol (ZYLOPRIM) 100 MG tablet Take 200 mg by mouth daily.    Marland Kitchen amLODipine (NORVASC) 10 MG tablet Take 10 mg by mouth at bedtime.     Marland Kitchen aspirin EC 81 MG tablet Take 81 mg by mouth daily.    Marland Kitchen atorvastatin (LIPITOR) 80 MG tablet Take 1 tablet (80 mg total) by mouth daily. 30 tablet 0  . carvedilol (COREG) 12.5 MG tablet Take 12.5 mg by mouth 2 (two) times daily.    . chlorthalidone (HYGROTON) 25 MG tablet Take 25 mg by mouth every morning.     . Cholecalciferol (D-5000) 5000 UNITS TABS Take 5,000 Units by mouth every morning.     Marland Kitchen  Cyanocobalamin-Salcaprozate (ELIGEN B12) 1000-100 MCG-MG TABS Take by mouth daily.    . ferrous sulfate 325 (65 FE) MG tablet Take 325 mg by mouth daily.    Marland Kitchen glimepiride (AMARYL) 4 MG tablet Take 2 mg by mouth daily.     Marland Kitchen HYDROcodone-acetaminophen (NORCO) 5-325 MG per tablet Take 1 tablet by mouth every 6 (six) hours as needed for moderate  pain or severe pain. 20 tablet 0  . insulin glargine (LANTUS) 100 UNIT/ML injection Inject 10 Units into the skin every morning.     . irbesartan (AVAPRO) 150 MG tablet Take by mouth.    . metFORMIN (GLUCOPHAGE-XR) 500 MG 24 hr tablet Take 500 mg by mouth 2 (two) times daily.    . Multiple Vitamin (MULTI-VITAMINS) TABS Take by mouth.    . naproxen (NAPROSYN) 250 MG tablet Take 1 tablet (250 mg total) by mouth at bedtime as needed for moderate pain. 30 tablet 0  . omeprazole (PRILOSEC) 20 MG capsule Take 40 mg by mouth every morning.      No current facility-administered medications for this encounter.    ECOG PERFORMANCE STATUS:  0 - Asymptomatic  REVIEW OF SYSTEMS: Except for recovering from her recent CVA which was confirmed on MRI of her brain Patient denies any weight loss, fatigue, weakness, fever, chills or night sweats. Patient denies any loss of vision, blurred vision. Patient denies any ringing  of the ears or hearing loss. No irregular heartbeat. Patient denies heart murmur or history of fainting. Patient denies any chest pain or pain radiating to her upper extremities. Patient denies any shortness of breath, difficulty breathing at night, cough or hemoptysis. Patient denies any swelling in the lower legs. Patient denies any nausea vomiting, vomiting of blood, or coffee ground material in the vomitus. Patient denies any stomach pain. Patient states has had normal bowel movements no significant constipation or diarrhea. Patient denies any dysuria, hematuria or significant nocturia. Patient denies any problems walking, swelling in the joints or loss of balance. Patient denies any skin changes, loss of hair or loss of weight. Patient denies any excessive worrying or anxiety or significant depression. Patient denies any problems with insomnia. Patient denies excessive thirst, polyuria, polydipsia. Patient denies any swollen glands, patient denies easy bruising or easy bleeding. Patient denies any  recent infections, allergies or URI. Patient "s visual fields have not changed significantly in recent time.    PHYSICAL EXAM: BP 131/85 mmHg  Pulse 80  Temp(Src) 98 F (36.7 C)  Ht 5\' 3"  (1.6 m)  Wt 237 lb 8.7 oz (107.75 kg)  BMI 42.09 kg/m2 Well-developed obese female in NAD. Breasts are large and pendulous. No dominant mass or nodularity is noted in either breast in 2 positions examined. Right breast shows recent wide local excision scar which is healing well. No axillary or supraclavicular adenopathy is appreciated. I see no evidence of facial drop at this time. Well-developed well-nourished patient in NAD. HEENT reveals PERLA, EOMI, discs not visualized.  Oral cavity is clear. No oral mucosal lesions are identified. Neck is clear without evidence of cervical or supraclavicular adenopathy. Lungs are clear to A&P. Cardiac examination is essentially unremarkable with regular rate and rhythm without murmur rub or thrill. Abdomen is benign with no organomegaly or masses noted. Motor sensory and DTR levels are equal and symmetric in the upper and lower extremities. Cranial nerves II through XII are grossly intact. Proprioception is intact. No peripheral adenopathy or edema is identified. No motor or sensory levels are noted.  Crude visual fields are within normal range.   LABORATORY DATA: Pathology reports reviewed    RADIOLOGY RESULTS: Mammograms ultrasound and MRI of brain reviewed   IMPRESSION: Stage I (T1 N0 M0) invasive mammary carcinoma the right breast as was wide local excision and sentinel node biopsy triple negative disease in 74 year old female  PLAN: I've got over treatment recommendations including whole breast radiation versus accelerated partial breast irradiation. I discussed the case personally with surgeon who believes she has a good lumpectomy cavity for MammoSite balloon placement. Based on her comorbidities including morbid obesity recent stroke I believe to be an excellent  candidate for accelerated partial breast irradiation to deliver 3400 cGy in 10 fractions at 340 cGy twice a day through MammoSite catheter. Risks and benefits of the procedure were reviewed with the patient and her daughter and both seem to comprehend our treatment plan well. We have arrange for MammoSite catheter placement by the surgeon. Risks and benefits of treatment including possible small area of skin reaction, thickening of the lumpectomy site scarring, fatigue, all were described in detail to the patient and her daughter. Patient will obese simulator shortly after MammoSite catheter placement. All those arrangements were made.  I would like to take this opportunity for allowing me to participate in the care of your patient.Armstead Peaks., MD

## 2014-12-10 NOTE — Telephone Encounter (Signed)
Mammosite schedule reviewed with the patient Placement 12-16-14 1pm    at [facility] Scan 12-18-14 Treat 9-26 to 9-30 Aware of ATB and directions reviewed. Aware no showers and to wear her bra while mammosite in place. Pt agrees.

## 2014-12-16 ENCOUNTER — Other Ambulatory Visit: Payer: Commercial Managed Care - HMO

## 2014-12-16 ENCOUNTER — Encounter: Payer: Self-pay | Admitting: General Surgery

## 2014-12-16 ENCOUNTER — Ambulatory Visit (INDEPENDENT_AMBULATORY_CARE_PROVIDER_SITE_OTHER): Payer: Commercial Managed Care - HMO | Admitting: General Surgery

## 2014-12-16 VITALS — BP 134/62 | HR 88 | Resp 16 | Ht 62.0 in | Wt 238.0 lb

## 2014-12-16 DIAGNOSIS — C50911 Malignant neoplasm of unspecified site of right female breast: Secondary | ICD-10-CM

## 2014-12-16 NOTE — Progress Notes (Signed)
This is a 74 year old female here today for right mammosite placement.  No interval problems. Right lumpectomy and SN sites are clean. 4-5cm Mammosite was placed with US guidance. Skin distance to cavity is > 1cm.  Pt to follow up in 2 weeks after completion of radiation.

## 2014-12-16 NOTE — Patient Instructions (Signed)
Patient care kit given to patient.  Instructed no showers, sponge bath while mammosite in place, take antibiotic. Follow up with [facility] as arranged. Discussed wearing your bra for support at all times. 

## 2014-12-18 ENCOUNTER — Ambulatory Visit
Admission: RE | Admit: 2014-12-18 | Discharge: 2014-12-18 | Disposition: A | Discharge status: Home / Self Care | Payer: Commercial Managed Care - HMO | Source: Ambulatory Visit | Attending: Radiation Oncology | Admitting: Radiation Oncology

## 2014-12-18 DIAGNOSIS — C50411 Malignant neoplasm of upper-outer quadrant of right female breast: Secondary | ICD-10-CM | POA: Diagnosis not present

## 2014-12-18 DIAGNOSIS — Z794 Long term (current) use of insulin: Secondary | ICD-10-CM | POA: Diagnosis not present

## 2014-12-18 DIAGNOSIS — Z510 Encounter for antineoplastic radiation therapy: Secondary | ICD-10-CM | POA: Diagnosis not present

## 2014-12-18 DIAGNOSIS — Z7982 Long term (current) use of aspirin: Secondary | ICD-10-CM | POA: Diagnosis not present

## 2014-12-18 DIAGNOSIS — E119 Type 2 diabetes mellitus without complications: Secondary | ICD-10-CM | POA: Diagnosis not present

## 2014-12-18 DIAGNOSIS — I10 Essential (primary) hypertension: Secondary | ICD-10-CM | POA: Diagnosis not present

## 2014-12-18 DIAGNOSIS — Z87891 Personal history of nicotine dependence: Secondary | ICD-10-CM | POA: Diagnosis not present

## 2014-12-18 DIAGNOSIS — Z6841 Body Mass Index (BMI) 40.0 and over, adult: Secondary | ICD-10-CM | POA: Diagnosis not present

## 2014-12-18 DIAGNOSIS — Z171 Estrogen receptor negative status [ER-]: Secondary | ICD-10-CM | POA: Diagnosis not present

## 2014-12-18 DIAGNOSIS — Z801 Family history of malignant neoplasm of trachea, bronchus and lung: Secondary | ICD-10-CM | POA: Diagnosis not present

## 2014-12-18 DIAGNOSIS — Z8673 Personal history of transient ischemic attack (TIA), and cerebral infarction without residual deficits: Secondary | ICD-10-CM | POA: Diagnosis not present

## 2014-12-18 DIAGNOSIS — Z791 Long term (current) use of non-steroidal anti-inflammatories (NSAID): Secondary | ICD-10-CM | POA: Diagnosis not present

## 2014-12-18 DIAGNOSIS — C50211 Malignant neoplasm of upper-inner quadrant of right female breast: Secondary | ICD-10-CM | POA: Diagnosis not present

## 2014-12-18 DIAGNOSIS — E785 Hyperlipidemia, unspecified: Secondary | ICD-10-CM | POA: Diagnosis not present

## 2014-12-18 DIAGNOSIS — Z79899 Other long term (current) drug therapy: Secondary | ICD-10-CM | POA: Diagnosis not present

## 2014-12-21 ENCOUNTER — Ambulatory Visit
Admission: RE | Admit: 2014-12-21 | Discharge: 2014-12-21 | Disposition: A | Discharge status: Home / Self Care | Payer: Commercial Managed Care - HMO | Source: Ambulatory Visit | Attending: Radiation Oncology | Admitting: Radiation Oncology

## 2014-12-21 DIAGNOSIS — Z8673 Personal history of transient ischemic attack (TIA), and cerebral infarction without residual deficits: Secondary | ICD-10-CM | POA: Diagnosis not present

## 2014-12-21 DIAGNOSIS — Z7982 Long term (current) use of aspirin: Secondary | ICD-10-CM | POA: Diagnosis not present

## 2014-12-21 DIAGNOSIS — Z801 Family history of malignant neoplasm of trachea, bronchus and lung: Secondary | ICD-10-CM | POA: Diagnosis not present

## 2014-12-21 DIAGNOSIS — C50211 Malignant neoplasm of upper-inner quadrant of right female breast: Secondary | ICD-10-CM | POA: Diagnosis not present

## 2014-12-21 DIAGNOSIS — Z510 Encounter for antineoplastic radiation therapy: Secondary | ICD-10-CM | POA: Diagnosis not present

## 2014-12-21 DIAGNOSIS — Z171 Estrogen receptor negative status [ER-]: Secondary | ICD-10-CM | POA: Diagnosis not present

## 2014-12-21 DIAGNOSIS — C50411 Malignant neoplasm of upper-outer quadrant of right female breast: Secondary | ICD-10-CM | POA: Diagnosis not present

## 2014-12-21 DIAGNOSIS — E785 Hyperlipidemia, unspecified: Secondary | ICD-10-CM | POA: Diagnosis not present

## 2014-12-21 DIAGNOSIS — I10 Essential (primary) hypertension: Secondary | ICD-10-CM | POA: Diagnosis not present

## 2014-12-21 DIAGNOSIS — E119 Type 2 diabetes mellitus without complications: Secondary | ICD-10-CM | POA: Diagnosis not present

## 2014-12-22 ENCOUNTER — Ambulatory Visit
Admission: RE | Admit: 2014-12-22 | Discharge: 2014-12-22 | Disposition: A | Discharge status: Home / Self Care | Payer: Commercial Managed Care - HMO | Source: Ambulatory Visit | Attending: Radiation Oncology | Admitting: Radiation Oncology

## 2014-12-22 DIAGNOSIS — C50411 Malignant neoplasm of upper-outer quadrant of right female breast: Secondary | ICD-10-CM | POA: Diagnosis not present

## 2014-12-22 DIAGNOSIS — Z8673 Personal history of transient ischemic attack (TIA), and cerebral infarction without residual deficits: Secondary | ICD-10-CM | POA: Diagnosis not present

## 2014-12-22 DIAGNOSIS — Z510 Encounter for antineoplastic radiation therapy: Secondary | ICD-10-CM | POA: Diagnosis not present

## 2014-12-22 DIAGNOSIS — I10 Essential (primary) hypertension: Secondary | ICD-10-CM | POA: Diagnosis not present

## 2014-12-22 DIAGNOSIS — E785 Hyperlipidemia, unspecified: Secondary | ICD-10-CM | POA: Diagnosis not present

## 2014-12-22 DIAGNOSIS — E119 Type 2 diabetes mellitus without complications: Secondary | ICD-10-CM | POA: Diagnosis not present

## 2014-12-22 DIAGNOSIS — Z801 Family history of malignant neoplasm of trachea, bronchus and lung: Secondary | ICD-10-CM | POA: Diagnosis not present

## 2014-12-22 DIAGNOSIS — Z7982 Long term (current) use of aspirin: Secondary | ICD-10-CM | POA: Diagnosis not present

## 2014-12-22 DIAGNOSIS — Z171 Estrogen receptor negative status [ER-]: Secondary | ICD-10-CM | POA: Diagnosis not present

## 2014-12-22 DIAGNOSIS — C50211 Malignant neoplasm of upper-inner quadrant of right female breast: Secondary | ICD-10-CM | POA: Diagnosis not present

## 2014-12-23 ENCOUNTER — Ambulatory Visit
Admission: RE | Admit: 2014-12-23 | Discharge: 2014-12-23 | Disposition: A | Discharge status: Home / Self Care | Payer: Commercial Managed Care - HMO | Source: Ambulatory Visit | Attending: Radiation Oncology | Admitting: Radiation Oncology

## 2014-12-23 DIAGNOSIS — I10 Essential (primary) hypertension: Secondary | ICD-10-CM | POA: Diagnosis not present

## 2014-12-23 DIAGNOSIS — Z7982 Long term (current) use of aspirin: Secondary | ICD-10-CM | POA: Diagnosis not present

## 2014-12-23 DIAGNOSIS — Z8673 Personal history of transient ischemic attack (TIA), and cerebral infarction without residual deficits: Secondary | ICD-10-CM | POA: Diagnosis not present

## 2014-12-23 DIAGNOSIS — E119 Type 2 diabetes mellitus without complications: Secondary | ICD-10-CM | POA: Diagnosis not present

## 2014-12-23 DIAGNOSIS — Z510 Encounter for antineoplastic radiation therapy: Secondary | ICD-10-CM | POA: Diagnosis not present

## 2014-12-23 DIAGNOSIS — C50211 Malignant neoplasm of upper-inner quadrant of right female breast: Secondary | ICD-10-CM | POA: Diagnosis not present

## 2014-12-23 DIAGNOSIS — Z171 Estrogen receptor negative status [ER-]: Secondary | ICD-10-CM | POA: Diagnosis not present

## 2014-12-23 DIAGNOSIS — E785 Hyperlipidemia, unspecified: Secondary | ICD-10-CM | POA: Diagnosis not present

## 2014-12-23 DIAGNOSIS — C50411 Malignant neoplasm of upper-outer quadrant of right female breast: Secondary | ICD-10-CM | POA: Diagnosis not present

## 2014-12-23 DIAGNOSIS — Z801 Family history of malignant neoplasm of trachea, bronchus and lung: Secondary | ICD-10-CM | POA: Diagnosis not present

## 2014-12-24 ENCOUNTER — Ambulatory Visit: Payer: Commercial Managed Care - HMO

## 2014-12-25 ENCOUNTER — Ambulatory Visit: Payer: Commercial Managed Care - HMO

## 2014-12-28 ENCOUNTER — Ambulatory Visit
Admission: RE | Admit: 2014-12-28 | Discharge: 2014-12-28 | Disposition: A | Discharge status: Home / Self Care | Payer: Commercial Managed Care - HMO | Source: Ambulatory Visit | Attending: Radiation Oncology | Admitting: Radiation Oncology

## 2014-12-28 ENCOUNTER — Inpatient Hospital Stay
Admission: RE | Admit: 2014-12-28 | Discharge: 2014-12-28 | Disposition: A | Discharge status: Home / Self Care | Payer: Self-pay | Source: Ambulatory Visit | Attending: Radiation Oncology | Admitting: Radiation Oncology

## 2014-12-28 DIAGNOSIS — E119 Type 2 diabetes mellitus without complications: Secondary | ICD-10-CM | POA: Diagnosis not present

## 2014-12-28 DIAGNOSIS — C50411 Malignant neoplasm of upper-outer quadrant of right female breast: Secondary | ICD-10-CM | POA: Diagnosis not present

## 2014-12-28 DIAGNOSIS — Z8673 Personal history of transient ischemic attack (TIA), and cerebral infarction without residual deficits: Secondary | ICD-10-CM | POA: Diagnosis not present

## 2014-12-28 DIAGNOSIS — I10 Essential (primary) hypertension: Secondary | ICD-10-CM | POA: Diagnosis not present

## 2014-12-28 DIAGNOSIS — Z801 Family history of malignant neoplasm of trachea, bronchus and lung: Secondary | ICD-10-CM | POA: Diagnosis not present

## 2014-12-28 DIAGNOSIS — C50211 Malignant neoplasm of upper-inner quadrant of right female breast: Secondary | ICD-10-CM | POA: Diagnosis not present

## 2014-12-28 DIAGNOSIS — E785 Hyperlipidemia, unspecified: Secondary | ICD-10-CM | POA: Diagnosis not present

## 2014-12-28 DIAGNOSIS — Z7982 Long term (current) use of aspirin: Secondary | ICD-10-CM | POA: Diagnosis not present

## 2014-12-28 DIAGNOSIS — Z510 Encounter for antineoplastic radiation therapy: Secondary | ICD-10-CM | POA: Diagnosis not present

## 2014-12-28 DIAGNOSIS — Z171 Estrogen receptor negative status [ER-]: Secondary | ICD-10-CM | POA: Diagnosis not present

## 2014-12-29 ENCOUNTER — Ambulatory Visit
Admission: RE | Admit: 2014-12-29 | Discharge: 2014-12-29 | Disposition: A | Discharge status: Home / Self Care | Payer: Commercial Managed Care - HMO | Source: Ambulatory Visit | Attending: Radiation Oncology | Admitting: Radiation Oncology

## 2014-12-29 ENCOUNTER — Inpatient Hospital Stay
Admission: RE | Admit: 2014-12-29 | Discharge: 2014-12-29 | Disposition: A | Discharge status: Home / Self Care | Payer: Self-pay | Source: Ambulatory Visit | Attending: Radiation Oncology | Admitting: Radiation Oncology

## 2014-12-29 ENCOUNTER — Telehealth: Payer: Self-pay | Admitting: *Deleted

## 2014-12-29 DIAGNOSIS — Z510 Encounter for antineoplastic radiation therapy: Secondary | ICD-10-CM | POA: Diagnosis not present

## 2014-12-29 DIAGNOSIS — E119 Type 2 diabetes mellitus without complications: Secondary | ICD-10-CM | POA: Diagnosis not present

## 2014-12-29 DIAGNOSIS — Z171 Estrogen receptor negative status [ER-]: Secondary | ICD-10-CM | POA: Diagnosis not present

## 2014-12-29 DIAGNOSIS — C50211 Malignant neoplasm of upper-inner quadrant of right female breast: Secondary | ICD-10-CM | POA: Diagnosis not present

## 2014-12-29 DIAGNOSIS — E785 Hyperlipidemia, unspecified: Secondary | ICD-10-CM | POA: Diagnosis not present

## 2014-12-29 DIAGNOSIS — Z7982 Long term (current) use of aspirin: Secondary | ICD-10-CM | POA: Diagnosis not present

## 2014-12-29 DIAGNOSIS — C50411 Malignant neoplasm of upper-outer quadrant of right female breast: Secondary | ICD-10-CM | POA: Diagnosis not present

## 2014-12-29 DIAGNOSIS — Z801 Family history of malignant neoplasm of trachea, bronchus and lung: Secondary | ICD-10-CM | POA: Diagnosis not present

## 2014-12-29 DIAGNOSIS — I10 Essential (primary) hypertension: Secondary | ICD-10-CM | POA: Diagnosis not present

## 2014-12-29 DIAGNOSIS — Z8673 Personal history of transient ischemic attack (TIA), and cerebral infarction without residual deficits: Secondary | ICD-10-CM | POA: Diagnosis not present

## 2014-12-29 NOTE — Telephone Encounter (Signed)
Patient called stating her last radiation treatment is scheduled for today. She was confused about when she needs to return to see you, according to the last note it was to follow up in 2 weeks after completion of radiation. Does she need to keep appointment for tomorrow 12/30/14 or wait 2 weeks?

## 2014-12-30 ENCOUNTER — Ambulatory Visit: Payer: Commercial Managed Care - HMO | Admitting: General Surgery

## 2015-01-01 DIAGNOSIS — M25561 Pain in right knee: Secondary | ICD-10-CM | POA: Diagnosis not present

## 2015-01-01 DIAGNOSIS — I639 Cerebral infarction, unspecified: Secondary | ICD-10-CM | POA: Diagnosis not present

## 2015-01-01 DIAGNOSIS — N926 Irregular menstruation, unspecified: Secondary | ICD-10-CM | POA: Diagnosis not present

## 2015-01-01 DIAGNOSIS — M25562 Pain in left knee: Secondary | ICD-10-CM | POA: Diagnosis not present

## 2015-01-01 DIAGNOSIS — C50211 Malignant neoplasm of upper-inner quadrant of right female breast: Secondary | ICD-10-CM | POA: Diagnosis not present

## 2015-01-07 ENCOUNTER — Ambulatory Visit (INDEPENDENT_AMBULATORY_CARE_PROVIDER_SITE_OTHER): Payer: Commercial Managed Care - HMO | Admitting: General Surgery

## 2015-01-07 ENCOUNTER — Encounter: Payer: Self-pay | Admitting: General Surgery

## 2015-01-07 DIAGNOSIS — C50211 Malignant neoplasm of upper-inner quadrant of right female breast: Secondary | ICD-10-CM

## 2015-01-07 NOTE — Patient Instructions (Addendum)
The patient is aware to call back for any questions or concerns.Patient to return in 4 months right diagnotic mammogram.

## 2015-01-07 NOTE — Progress Notes (Signed)
Here today for her follow up from right breast mammosite. Patient states she is still having a hard time walking and standing up.  Right breast incision site is healing well. No signs of seroma or infection.  .  Patient to return in 4 months right diagnotic mammogram.

## 2015-01-08 ENCOUNTER — Encounter: Payer: Self-pay | Admitting: General Surgery

## 2015-01-12 ENCOUNTER — Encounter: Payer: Self-pay | Admitting: *Deleted

## 2015-01-12 NOTE — Progress Notes (Signed)
  Oncology Nurse Navigator Documentation    Navigator Encounter Type: Written/Letter (card) (01/12/15 1400) Patient Visit Type: Follow-up (01/12/15 1400) Treatment Phase: Other (RXT complete) (01/12/15 1400)      Patient completed her mammosite treatment.  Thinking of you card mailed.            Time Spent with Patient: 15 (01/12/15 1400)

## 2015-01-13 DIAGNOSIS — R319 Hematuria, unspecified: Secondary | ICD-10-CM | POA: Diagnosis not present

## 2015-01-14 DIAGNOSIS — M170 Bilateral primary osteoarthritis of knee: Secondary | ICD-10-CM | POA: Diagnosis not present

## 2015-01-19 DIAGNOSIS — C50211 Malignant neoplasm of upper-inner quadrant of right female breast: Secondary | ICD-10-CM | POA: Diagnosis not present

## 2015-01-19 DIAGNOSIS — Z794 Long term (current) use of insulin: Secondary | ICD-10-CM | POA: Diagnosis not present

## 2015-01-19 DIAGNOSIS — D649 Anemia, unspecified: Secondary | ICD-10-CM | POA: Diagnosis not present

## 2015-01-19 DIAGNOSIS — I10 Essential (primary) hypertension: Secondary | ICD-10-CM | POA: Diagnosis not present

## 2015-01-19 DIAGNOSIS — E119 Type 2 diabetes mellitus without complications: Secondary | ICD-10-CM | POA: Diagnosis not present

## 2015-01-19 DIAGNOSIS — N390 Urinary tract infection, site not specified: Secondary | ICD-10-CM | POA: Diagnosis not present

## 2015-01-22 DIAGNOSIS — R7302 Impaired glucose tolerance (oral): Secondary | ICD-10-CM | POA: Diagnosis not present

## 2015-01-28 ENCOUNTER — Ambulatory Visit: Payer: Commercial Managed Care - HMO | Admitting: Radiation Oncology

## 2015-01-28 ENCOUNTER — Ambulatory Visit
Admission: RE | Admit: 2015-01-28 | Discharge: 2015-01-28 | Disposition: A | Discharge status: Home / Self Care | Payer: Commercial Managed Care - HMO | Source: Ambulatory Visit | Attending: Radiation Oncology | Admitting: Radiation Oncology

## 2015-01-28 ENCOUNTER — Encounter: Payer: Self-pay | Admitting: Radiation Oncology

## 2015-01-28 VITALS — BP 127/72 | HR 92 | Temp 97.4°F | Resp 20 | Wt 231.5 lb

## 2015-01-28 DIAGNOSIS — C50911 Malignant neoplasm of unspecified site of right female breast: Secondary | ICD-10-CM

## 2015-01-28 NOTE — Progress Notes (Signed)
Radiation Oncology Follow up Note  Name: Alexis Greer   Date:   01/28/2015 MRN:  [id number] DOB: September 10, 1940    This 74 y.o. 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  REFERRING PROVIDER: Juluis Pitch, MD  HPI: Patient is a 74 year old female now 1 month out having completed accelerated partial breast radiation to her right breast for a T1 N0 invasive mammary carcinoma. Tumor was triple negative she is doing well 1 month out and is without complaint. She specifically denies breast tenderness cough or bone pain. She is not on tamoxifen based on her triple negative nature of her disease..  COMPLICATIONS OF TREATMENT: none  FOLLOW UP COMPLIANCE: keeps appointments   PHYSICAL EXAM:  BP 127/72 mmHg  Pulse 92  Temp(Src) 97.4 F (36.3 C)  Resp 20  Wt 231 lb 7.7 oz (105 kg)  LMP  (Approximate) Lungs are clear to A&P cardiac examination essentially unremarkable with regular rate and rhythm. No dominant mass or nodularity is noted in either breast in 2 positions examined. Incision is well-healed. No axillary or supraclavicular adenopathy is appreciated. Cosmetic result is excellent. Well-developed well-nourished patient in NAD. HEENT reveals PERLA, EOMI, discs not visualized.  Oral cavity is clear. No oral mucosal lesions are identified. Neck is clear without evidence of cervical or supraclavicular adenopathy. Lungs are clear to A&P. Cardiac examination is essentially unremarkable with regular rate and rhythm without murmur rub or thrill. Abdomen is benign with no organomegaly or masses noted. Motor sensory and DTR levels are equal and symmetric in the upper and lower extremities. Cranial nerves II through XII are grossly intact. Proprioception is intact. No peripheral adenopathy or edema is identified. No motor or sensory levels are noted. Crude visual fields are within normal  range.  RADIOLOGY RESULTS: No current films for review  PLAN: Present time 1 month out she is doing extremely well with no side effects or complaints. I'm please were overall progress. I've asked to see her back in 4-5 months for follow-up. She knows to call sooner with any concerns.  I would like to take this opportunity for allowing me to participate in the care of your patient.Armstead Peaks., MD

## 2015-02-02 DIAGNOSIS — E875 Hyperkalemia: Secondary | ICD-10-CM | POA: Diagnosis not present

## 2015-03-11 ENCOUNTER — Other Ambulatory Visit: Payer: Self-pay | Admitting: *Deleted

## 2015-03-11 DIAGNOSIS — C50211 Malignant neoplasm of upper-inner quadrant of right female breast: Secondary | ICD-10-CM

## 2015-03-24 DIAGNOSIS — I10 Essential (primary) hypertension: Secondary | ICD-10-CM | POA: Diagnosis not present

## 2015-04-28 DIAGNOSIS — N289 Disorder of kidney and ureter, unspecified: Secondary | ICD-10-CM | POA: Diagnosis not present

## 2015-04-28 DIAGNOSIS — Z794 Long term (current) use of insulin: Secondary | ICD-10-CM | POA: Diagnosis not present

## 2015-04-28 DIAGNOSIS — E119 Type 2 diabetes mellitus without complications: Secondary | ICD-10-CM | POA: Diagnosis not present

## 2015-04-28 DIAGNOSIS — M25561 Pain in right knee: Secondary | ICD-10-CM | POA: Diagnosis not present

## 2015-04-28 DIAGNOSIS — E785 Hyperlipidemia, unspecified: Secondary | ICD-10-CM | POA: Diagnosis not present

## 2015-04-28 DIAGNOSIS — Z6841 Body Mass Index (BMI) 40.0 and over, adult: Secondary | ICD-10-CM | POA: Diagnosis not present

## 2015-04-28 DIAGNOSIS — M25562 Pain in left knee: Secondary | ICD-10-CM | POA: Diagnosis not present

## 2015-04-28 DIAGNOSIS — I10 Essential (primary) hypertension: Secondary | ICD-10-CM | POA: Diagnosis not present

## 2015-05-25 ENCOUNTER — Ambulatory Visit
Admission: RE | Admit: 2015-05-25 | Discharge: 2015-05-25 | Disposition: A | Discharge status: Home / Self Care | Payer: Commercial Managed Care - HMO | Source: Ambulatory Visit | Attending: General Surgery | Admitting: General Surgery

## 2015-05-25 ENCOUNTER — Other Ambulatory Visit: Payer: Self-pay | Admitting: General Surgery

## 2015-05-25 DIAGNOSIS — C50211 Malignant neoplasm of upper-inner quadrant of right female breast: Secondary | ICD-10-CM

## 2015-05-25 DIAGNOSIS — Z9889 Other specified postprocedural states: Secondary | ICD-10-CM | POA: Insufficient documentation

## 2015-05-25 DIAGNOSIS — R928 Other abnormal and inconclusive findings on diagnostic imaging of breast: Secondary | ICD-10-CM | POA: Diagnosis not present

## 2015-06-01 ENCOUNTER — Ambulatory Visit (INDEPENDENT_AMBULATORY_CARE_PROVIDER_SITE_OTHER): Payer: Commercial Managed Care - HMO | Admitting: General Surgery

## 2015-06-01 ENCOUNTER — Encounter: Payer: Self-pay | Admitting: General Surgery

## 2015-06-01 VITALS — BP 140/70 | HR 78 | Resp 14 | Ht 63.0 in | Wt 234.0 lb

## 2015-06-01 DIAGNOSIS — C50211 Malignant neoplasm of upper-inner quadrant of right female breast: Secondary | ICD-10-CM | POA: Diagnosis not present

## 2015-06-01 NOTE — Progress Notes (Signed)
Patient ID: Alexis Greer, female   DOB: [birth year], 75 y.o.   MRN: [id number]  Chief Complaint  Patient presents with  . Follow-up    mammogram    HPI Alexis Greer is a 75 y.o. female who presents for a breast cancer follow up. The most recent right breast mammogram was done on 05/25/15.  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 I have reviewed the history of present illness with the patient. HPI  Past Medical History  Diagnosis Date  . Diabetes mellitus without complication ([place])   . Hypertension   . Breast cancer ([place]) 11/17/14    right breast, triple negative  . Asthma   . Arthritis   . Hyperlipidemia   . Complication of anesthesia     Past Surgical History  Procedure Laterality Date  . Abdominal hysterectomy  1979  . Oophorectomy  2014  . Breast biopsy Right 11/17/2014    Invasive mammory carcinoma  . Breast lumpectomy with sentinel lymph node biopsy Right 12/01/2014    Procedure: BREAST LUMPECTOMY WITH SENTINEL LYMPH NODE BX;  Surgeon: Christene Lye, MD;  Location: [facility] ORS;  Service: General;  Laterality: Right;  . Breast biopsy Right 12/01/2014    Procedure: BREAST BIOPSY WITH NEEDLE LOCALIZATION;  Surgeon: Christene Lye, MD;  Location: [facility] ORS;  Service: General;  Laterality: Right;    Family History  Problem Relation Age of Onset  . Heart disease Mother   . Lung cancer Sister     smoker  . CAD Father   . Hyperlipidemia Father     Social History Social History  Substance Use Topics  . Smoking status: Former Smoker    Quit date: 08/25/1993  . Smokeless tobacco: Never Used  . Alcohol Use: No    Allergies  Allergen Reactions  . Oxycodone Other (See Comments)    It made the patient have shakes and jittery     Current Outpatient Prescriptions  Medication Sig Dispense Refill  . allopurinol (ZYLOPRIM) 100 MG tablet Take 200 mg by mouth  daily.    Marland Kitchen amLODipine (NORVASC) 10 MG tablet Take 10 mg by mouth at bedtime.     Marland Kitchen aspirin EC 81 MG tablet Take 81 mg by mouth daily.    Marland Kitchen atorvastatin (LIPITOR) 80 MG tablet Take 1 tablet (80 mg total) by mouth daily. 30 tablet 0  . carvedilol (COREG) 12.5 MG tablet Take 12.5 mg by mouth 2 (two) times daily.    . cefadroxil (DURICEF) 500 MG capsule Take 1 capsule (500 mg total) by mouth 2 (two) times daily. Start one hour prior to procedure on 12-16-14 20 capsule 0  . chlorthalidone (HYGROTON) 25 MG tablet Take 25 mg by mouth every morning.     . Cholecalciferol (D-5000) 5000 UNITS TABS Take 5,000 Units by mouth every morning.     . Cyanocobalamin-Salcaprozate (ELIGEN B12) 1000-100 MCG-MG TABS Take by mouth daily.    . ferrous sulfate 325 (65 FE) MG tablet Take 325 mg by mouth daily.    Marland Kitchen glimepiride (AMARYL) 4 MG tablet Take 4 mg by mouth daily.     . insulin glargine (LANTUS) 100 UNIT/ML injection Inject 12 Units into the skin every morning.     . irbesartan (AVAPRO) 150 MG tablet Take by mouth.    . metFORMIN (GLUCOPHAGE-XR) 500 MG 24 hr tablet Take 500 mg by mouth 2 (two) times daily.    Marland Kitchen  Multiple Vitamin (MULTI-VITAMINS) TABS Take by mouth.    . naproxen (NAPROSYN) 250 MG tablet Take 1 tablet (250 mg total) by mouth at bedtime as needed for moderate pain. 30 tablet 0  . omeprazole (PRILOSEC) 20 MG capsule Take 40 mg by mouth every morning.      No current facility-administered medications for this visit.    Review of Systems Review of Systems  Constitutional: Negative.   Respiratory: Negative.   Cardiovascular: Negative.     Blood pressure 140/70, pulse 78, resp. rate 14, height 5\' 3"  (1.6 m), weight 234 lb (106.142 kg).  Physical Exam Physical Exam  Constitutional: She is oriented to person, place, and time. She appears well-developed and well-nourished.  Eyes: Conjunctivae are normal. No scleral icterus.  Neck: Neck supple.  Cardiovascular: Normal rate, regular rhythm and  normal heart sounds.   Pulmonary/Chest: Effort normal and breath sounds normal. Right breast exhibits no inverted nipple, no mass, no nipple discharge, no skin change and no tenderness. Left breast exhibits no inverted nipple, no mass, no nipple discharge, no skin change and no tenderness.  Abdominal: Soft. Bowel sounds are normal. There is no tenderness.  Lymphadenopathy:    She has no cervical adenopathy.    She has no axillary adenopathy.  Neurological: She is alert and oriented to person, place, and time.  Skin: Skin is warm and dry.    Data Reviewed Mammogram reviewed,no signs of recurrence.  Assessment     7 months post right breast lumpectomy & SLN Bx, followed by mammosite radiation. She had a CVA post lumpectomy and given the small size of triple neg cancer chemo was withheld. Current exam is stable.   Plan    Patient to return on 5 months bilateral diagnotic mammogram. Also to have CA27-29 done today    PCP:  Juluis Pitch  This information has been scribed by Gaspar Cola CMA.    SANKAR,SEEPLAPUTHUR G 06/01/2015, 9:38 AM

## 2015-06-01 NOTE — Patient Instructions (Signed)
Patient to return on 5 months bilateral diagnotic mammogram.

## 2015-06-02 ENCOUNTER — Telehealth: Payer: Self-pay | Admitting: *Deleted

## 2015-06-02 LAB — CANCER ANTIGEN 27.29: CA 27.29: 24.1 U/mL (ref 0.0–38.6)

## 2015-06-02 NOTE — Progress Notes (Signed)
Quick Note:  Inform pt labs are normal. F/u as scheduled ______ 

## 2015-06-02 NOTE — Telephone Encounter (Signed)
Notified patient as instructed, patient pleased. Discussed follow-up appointments, patient agrees  

## 2015-06-02 NOTE — Telephone Encounter (Signed)
-----   Message from Christene Lye, MD sent at 06/02/2015  8:51 AM EST ----- Inform pt labs are normal. F/u as scheduled

## 2015-06-23 DIAGNOSIS — M170 Bilateral primary osteoarthritis of knee: Secondary | ICD-10-CM | POA: Diagnosis not present

## 2015-07-01 ENCOUNTER — Encounter: Payer: Self-pay | Admitting: Radiation Oncology

## 2015-07-01 ENCOUNTER — Ambulatory Visit
Admission: RE | Admit: 2015-07-01 | Discharge: 2015-07-01 | Disposition: A | Discharge status: Home / Self Care | Payer: Commercial Managed Care - HMO | Source: Ambulatory Visit | Attending: Radiation Oncology | Admitting: Radiation Oncology

## 2015-07-01 VITALS — BP 131/65 | HR 79 | Temp 97.0°F | Resp 20 | Wt 228.5 lb

## 2015-07-01 DIAGNOSIS — C50211 Malignant neoplasm of upper-inner quadrant of right female breast: Secondary | ICD-10-CM

## 2015-07-01 DIAGNOSIS — Z853 Personal history of malignant neoplasm of breast: Secondary | ICD-10-CM | POA: Diagnosis not present

## 2015-07-01 NOTE — Progress Notes (Signed)
Radiation Oncology Follow up Note  Name: Alexis Greer   Date:   07/01/2015 MRN:  [id number] DOB: [birth year]    This 75 y.o. female presents to the clinic today for follow-up for stage I breast cancer status post accelerated partial breast irradiation to her right breast now out 6 months.  REFERRING PROVIDER: Juluis Pitch, MD  HPI: Patient is a 75 year old female now out 6 months having completed accelerated partial breast radiation to her right breast for a T1 N0 M0 invasive mammary carcinoma. Tumor was triple negative. She seen today in routine follow-up and is doing well.. Recent mammogram at the end of February showed changes consistent with [place] and partial breast radiation no findings worrisome for recurrent tumor developing malignancy. She specifically denies breast tenderness cough or bone pain.  COMPLICATIONS OF TREATMENT: none  FOLLOW UP COMPLIANCE: keeps appointments   PHYSICAL EXAM:  BP 131/65 mmHg  Pulse 79  Temp(Src) 97 F (36.1 C)  Resp 20  Wt 228 lb 8.1 oz (103.65 kg)  LMP  (Approximate) Lungs are clear to A&P cardiac examination essentially unremarkable with regular rate and rhythm. No dominant mass or nodularity is noted in either breast in 2 positions examined. Incision is well-healed. No axillary or supraclavicular adenopathy is appreciated. Cosmetic result is excellent. Well-developed well-nourished patient in NAD. HEENT reveals PERLA, EOMI, discs not visualized.  Oral cavity is clear. No oral mucosal lesions are identified. Neck is clear without evidence of cervical or supraclavicular adenopathy. Lungs are clear to A&P. Cardiac examination is essentially unremarkable with regular rate and rhythm without murmur rub or thrill. Abdomen is benign with no organomegaly or masses noted. Motor sensory and DTR levels are equal and symmetric in the upper and lower extremities. Cranial nerves II through XII are grossly intact. Proprioception is intact. No peripheral  adenopathy or edema is identified. No motor or sensory levels are noted. Crude visual fields are within normal range.  RADIOLOGY RESULTS: Recent mammograms are reviewed  PLAN: Present time she continues to do well with no evidence of disease. I am please were overall progress. I have asked to see her back in 1 year for follow-up. She is not on tamoxifen based on triple negative nature of her disease. Patient knows to call sooner with any concerns.  I would like to take this opportunity for allowing me to participate in the care of your patient.Armstead Peaks., MD

## 2015-07-22 DIAGNOSIS — Z0000 Encounter for general adult medical examination without abnormal findings: Secondary | ICD-10-CM | POA: Diagnosis not present

## 2015-07-22 DIAGNOSIS — R7302 Impaired glucose tolerance (oral): Secondary | ICD-10-CM | POA: Diagnosis not present

## 2015-07-22 DIAGNOSIS — E782 Mixed hyperlipidemia: Secondary | ICD-10-CM | POA: Diagnosis not present

## 2015-07-26 DIAGNOSIS — E119 Type 2 diabetes mellitus without complications: Secondary | ICD-10-CM | POA: Diagnosis not present

## 2015-07-26 DIAGNOSIS — Z794 Long term (current) use of insulin: Secondary | ICD-10-CM | POA: Diagnosis not present

## 2015-07-26 DIAGNOSIS — N289 Disorder of kidney and ureter, unspecified: Secondary | ICD-10-CM | POA: Diagnosis not present

## 2015-07-26 DIAGNOSIS — I10 Essential (primary) hypertension: Secondary | ICD-10-CM | POA: Diagnosis not present

## 2015-07-26 DIAGNOSIS — E782 Mixed hyperlipidemia: Secondary | ICD-10-CM | POA: Diagnosis not present

## 2015-08-19 ENCOUNTER — Other Ambulatory Visit: Payer: Self-pay

## 2015-08-19 DIAGNOSIS — C50211 Malignant neoplasm of upper-inner quadrant of right female breast: Secondary | ICD-10-CM

## 2015-09-30 DIAGNOSIS — E7800 Pure hypercholesterolemia, unspecified: Secondary | ICD-10-CM | POA: Diagnosis not present

## 2015-09-30 DIAGNOSIS — E119 Type 2 diabetes mellitus without complications: Secondary | ICD-10-CM | POA: Diagnosis not present

## 2015-09-30 DIAGNOSIS — H5210 Myopia, unspecified eye: Secondary | ICD-10-CM | POA: Diagnosis not present

## 2015-09-30 DIAGNOSIS — I10 Essential (primary) hypertension: Secondary | ICD-10-CM | POA: Diagnosis not present

## 2015-11-11 ENCOUNTER — Other Ambulatory Visit: Payer: Commercial Managed Care - HMO

## 2015-11-11 ENCOUNTER — Ambulatory Visit: Payer: Commercial Managed Care - HMO

## 2015-11-11 ENCOUNTER — Ambulatory Visit
Admission: RE | Admit: 2015-11-11 | Discharge: 2015-11-11 | Disposition: A | Discharge status: Home / Self Care | Payer: Commercial Managed Care - HMO | Source: Ambulatory Visit | Attending: General Surgery | Admitting: General Surgery

## 2015-11-11 ENCOUNTER — Other Ambulatory Visit: Payer: Self-pay | Admitting: General Surgery

## 2015-11-11 DIAGNOSIS — Z853 Personal history of malignant neoplasm of breast: Secondary | ICD-10-CM | POA: Insufficient documentation

## 2015-11-11 DIAGNOSIS — C50211 Malignant neoplasm of upper-inner quadrant of right female breast: Secondary | ICD-10-CM

## 2015-11-11 DIAGNOSIS — R928 Other abnormal and inconclusive findings on diagnostic imaging of breast: Secondary | ICD-10-CM | POA: Diagnosis not present

## 2015-11-17 ENCOUNTER — Encounter: Payer: Self-pay | Admitting: *Deleted

## 2015-11-22 DIAGNOSIS — R7302 Impaired glucose tolerance (oral): Secondary | ICD-10-CM | POA: Diagnosis not present

## 2015-11-22 DIAGNOSIS — E119 Type 2 diabetes mellitus without complications: Secondary | ICD-10-CM | POA: Diagnosis not present

## 2015-11-22 DIAGNOSIS — Z13220 Encounter for screening for lipoid disorders: Secondary | ICD-10-CM | POA: Diagnosis not present

## 2015-11-22 DIAGNOSIS — Z0000 Encounter for general adult medical examination without abnormal findings: Secondary | ICD-10-CM | POA: Diagnosis not present

## 2015-11-22 DIAGNOSIS — E782 Mixed hyperlipidemia: Secondary | ICD-10-CM | POA: Diagnosis not present

## 2015-11-23 ENCOUNTER — Encounter: Payer: Self-pay | Admitting: General Surgery

## 2015-11-23 ENCOUNTER — Ambulatory Visit (INDEPENDENT_AMBULATORY_CARE_PROVIDER_SITE_OTHER): Payer: Commercial Managed Care - HMO | Admitting: General Surgery

## 2015-11-23 VITALS — BP 118/62 | HR 72 | Resp 20 | Ht 62.0 in | Wt 241.0 lb

## 2015-11-23 DIAGNOSIS — C50211 Malignant neoplasm of upper-inner quadrant of right female breast: Secondary | ICD-10-CM | POA: Diagnosis not present

## 2015-11-23 NOTE — Patient Instructions (Addendum)
The patient is aware to call back for any questions or concerns. The patient has been asked to return to the office in one year with a bilateral diagnostic mammogram.

## 2015-11-23 NOTE — Progress Notes (Signed)
Patient ID: Alexis Greer, female   DOB: [birth year], 75 y.o.   MRN: [id number]  Chief Complaint  Patient presents with  . Follow-up    mammogram    HPI Alexis Greer is a 75 y.o. female.  who presents for her 6 month follow up breast cancer and a breast evaluation. The most recent mammogram was done on 11-11-15.  Patient does perform regular self breast checks and gets regular mammograms done.  She is using her cane for ambulation. She does complain of a little left knee arthritis pain. No new breast issues. I have reviewed the history of present illness with the patient.  HPI  Past Medical History:  Diagnosis Date  . Arthritis   . Asthma   . Breast cancer ([place]) 11/17/14   right breast lumpectomy, triple negative, with mammosite tx. Chemo with held due to CVA after lumpectomy  . Complication of anesthesia   . Diabetes mellitus without complication ([place])   . Hyperlipidemia   . Hypertension   . Stroke [facility]) 2016   post lumpectomy    Past Surgical History:  Procedure Laterality Date  . ABDOMINAL HYSTERECTOMY  1979  . BREAST BIOPSY Right 11/17/2014   Invasive mammory carcinoma  . BREAST BIOPSY Right 12/01/2014   Procedure: BREAST BIOPSY WITH NEEDLE LOCALIZATION;  Surgeon: Christene Lye, MD;  Location: [facility] ORS;  Service: General;  Laterality: Right;  . BREAST LUMPECTOMY WITH SENTINEL LYMPH NODE BIOPSY Right 12/01/2014   Procedure: BREAST LUMPECTOMY WITH SENTINEL LYMPH NODE BX;  Surgeon: Christene Lye, MD;  Location: [facility] ORS;  Service: General;  Laterality: Right;  . OOPHORECTOMY  2014    Family History  Problem Relation Age of Onset  . Heart disease Mother   . Lung cancer Sister     smoker  . CAD Father   . Hyperlipidemia Father     Social History Social History  Substance Use Topics  . Smoking status: Former Smoker    Quit date: 08/25/1993  . Smokeless tobacco: Never Used  . Alcohol use No    Allergies  Allergen Reactions  . Oxycodone Other  (See Comments)    It made the patient have shakes and jittery     Current Outpatient Prescriptions  Medication Sig Dispense Refill  . allopurinol (ZYLOPRIM) 100 MG tablet Take 200 mg by mouth daily.    Marland Kitchen amLODipine (NORVASC) 10 MG tablet Take 10 mg by mouth at bedtime.     Marland Kitchen aspirin EC 81 MG tablet Take 81 mg by mouth daily.    Marland Kitchen atorvastatin (LIPITOR) 80 MG tablet Take 1 tablet (80 mg total) by mouth daily. 30 tablet 0  . carvedilol (COREG) 12.5 MG tablet Take 12.5 mg by mouth 2 (two) times daily.    . Cholecalciferol (D-5000) 5000 UNITS TABS Take 5,000 Units by mouth every morning.     . Cyanocobalamin-Salcaprozate (ELIGEN B12) 1000-100 MCG-MG TABS Take by mouth daily.    . ferrous sulfate 325 (65 FE) MG tablet Take 325 mg by mouth daily.    Marland Kitchen glimepiride (AMARYL) 4 MG tablet Take 4 mg by mouth daily.     . insulin glargine (LANTUS) 100 UNIT/ML injection Inject 37 Units into the skin daily.     . irbesartan (AVAPRO) 150 MG tablet Take by mouth.    . LUBRICANT EYE DROPS 0.4-0.3 % SOLN     . Multiple Vitamin (MULTI-VITAMINS) TABS Take by mouth.    . naproxen (NAPROSYN) 250 MG tablet Take 1 tablet (  250 mg total) by mouth at bedtime as needed for moderate pain. 30 tablet 0  . omeprazole (PRILOSEC) 20 MG capsule Take 40 mg by mouth every morning.     . vitamin E 400 UNIT capsule     . chlorthalidone (HYGROTON) 25 MG tablet Take 25 mg by mouth every morning.      No current facility-administered medications for this visit.     Review of Systems Review of Systems  Constitutional: Negative.   Respiratory: Negative.   Cardiovascular: Negative.     Blood pressure 118/62, pulse 72, resp. rate 20, height 5\' 2"  (1.575 m), weight 241 lb (109.3 kg).  Physical Exam Physical Exam  Constitutional: She is oriented to person, place, and time. She appears well-developed and well-nourished.  HENT:  Mouth/Throat: Oropharynx is clear and moist.  Eyes: Conjunctivae are normal. No scleral icterus.   Neck: Neck supple.  Cardiovascular: Normal rate, regular rhythm and normal heart sounds.   Pulmonary/Chest: Effort normal and breath sounds normal. Right breast exhibits no inverted nipple, no mass, no nipple discharge, no skin change and no tenderness. Left breast exhibits no inverted nipple, no mass, no nipple discharge, no skin change and no tenderness.  Right breast incision clean and well healed.  Abdominal: Soft. Normal appearance. There is no tenderness.  Lymphadenopathy:    She has no cervical adenopathy.    She has no axillary adenopathy.  Neurological: She is alert and oriented to person, place, and time.  Skin: Skin is warm.  Psychiatric: Her behavior is normal.    Data Reviewed Mammogram reviewed and stable.  Assessment    1 year post right breast lumpectomy & SLN Bx, followed by mammosite radiation-triple neg CA She had a CVA post lumpectomy and given the small size of triple neg cancer chemo was withheld. Current exam is stable.     Plan     The patient has been asked to return to the office in one year with a bilateral diagnostic mammogram.       This information has been scribed by Karie Fetch RN, BSN,BC.    Zinedine Ellner G 11/23/2015, 9:22 AM

## 2015-11-26 DIAGNOSIS — E119 Type 2 diabetes mellitus without complications: Secondary | ICD-10-CM | POA: Diagnosis not present

## 2015-11-26 DIAGNOSIS — E782 Mixed hyperlipidemia: Secondary | ICD-10-CM | POA: Diagnosis not present

## 2015-11-26 DIAGNOSIS — I10 Essential (primary) hypertension: Secondary | ICD-10-CM | POA: Diagnosis not present

## 2015-11-26 DIAGNOSIS — N289 Disorder of kidney and ureter, unspecified: Secondary | ICD-10-CM | POA: Diagnosis not present

## 2015-11-26 DIAGNOSIS — Z794 Long term (current) use of insulin: Secondary | ICD-10-CM | POA: Diagnosis not present

## 2015-11-26 DIAGNOSIS — M1A9XX0 Chronic gout, unspecified, without tophus (tophi): Secondary | ICD-10-CM | POA: Diagnosis not present

## 2016-04-21 DIAGNOSIS — K625 Hemorrhage of anus and rectum: Secondary | ICD-10-CM | POA: Diagnosis not present

## 2016-04-21 DIAGNOSIS — K648 Other hemorrhoids: Secondary | ICD-10-CM | POA: Diagnosis not present

## 2016-04-24 DIAGNOSIS — R6889 Other general symptoms and signs: Secondary | ICD-10-CM | POA: Diagnosis not present

## 2016-04-24 DIAGNOSIS — K625 Hemorrhage of anus and rectum: Secondary | ICD-10-CM | POA: Diagnosis not present

## 2016-06-12 ENCOUNTER — Encounter: Payer: Self-pay | Admitting: *Deleted

## 2016-06-12 IMAGING — MG MM BREAST SURGICAL SPECIMEN
1 series · 1 of 1 positions shown · non-contrast
Comparison: Previous exam(s).

CLINICAL DATA: 74-year-old female status post right lumpectomy

EXAM:
SPECIMEN RADIOGRAPH OF THE RIGHT BREAST

[R SPECIMEN]
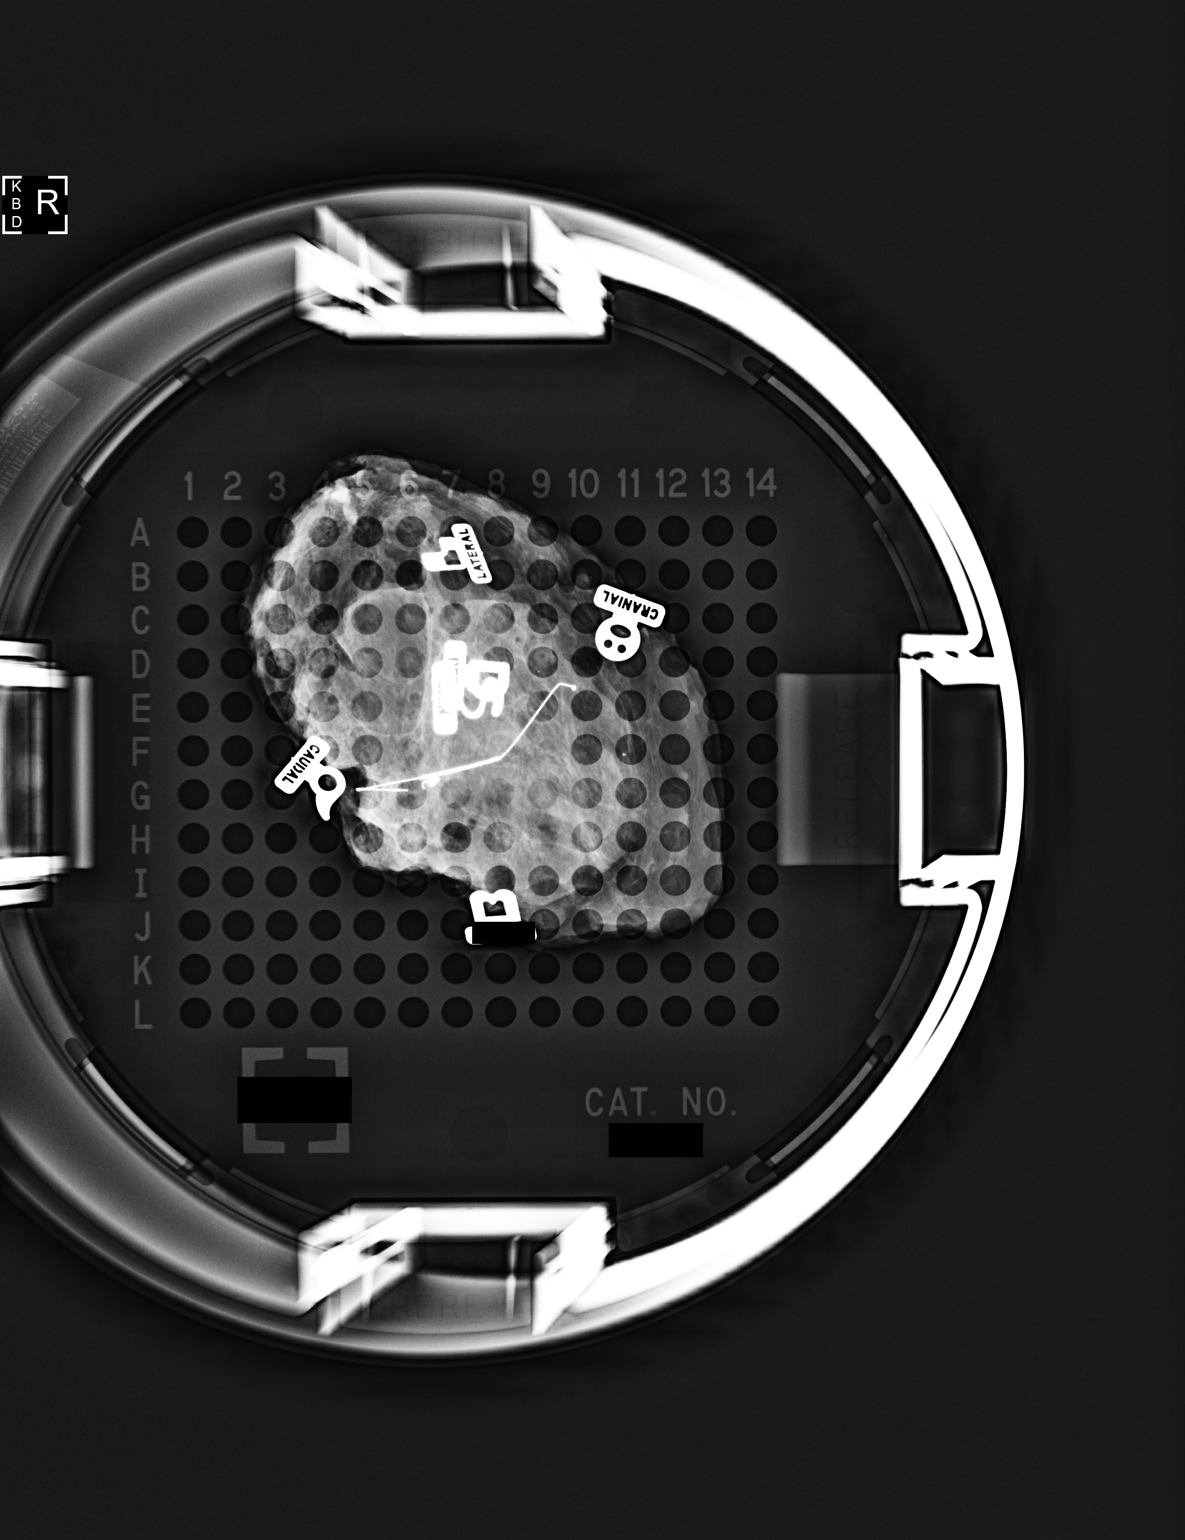

[1 of 1 positions shown; findings below may reference images not displayed]

FINDINGS: Status post excision of the right breast. The wire tip and biopsy
marker clip are present within the specimen. Findings were
communicated to Dr. Md Mafuz in the operating room at approximately
IMPRESSION: Specimen radiograph of the right breast.

## 2016-06-12 IMAGING — MR MR HEAD W/O CM
10 series · 48 of 48 positions shown · non-contrast
Comparison: CT head earlier today.

CLINICAL DATA: Patient developed left-sided facial droop and
slurred speech after surgery earlier today, also reportedly
developed right-sided weakness. Intraoperative hypotension is
reported.

EXAM:
MRI HEAD WITHOUT CONTRAST
TECHNIQUE: Multiplanar, multiecho pulse sequences of the brain and surrounding
structures were obtained without intravenous contrast.

[Series 2: T1 · sagittal · 5.0mm · 0.45mm/px · 4 of 26 slices shown]
[im 1/26]
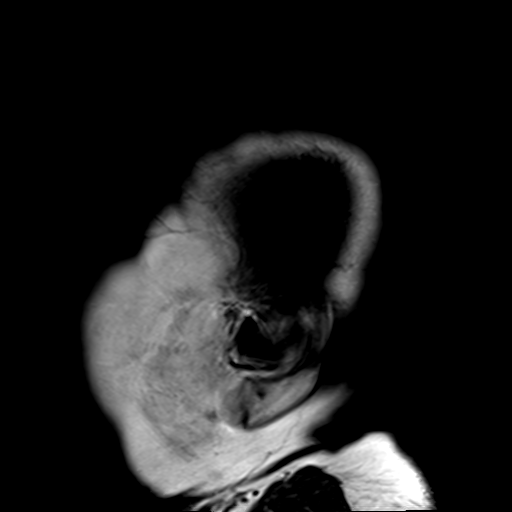
[im 9/26]
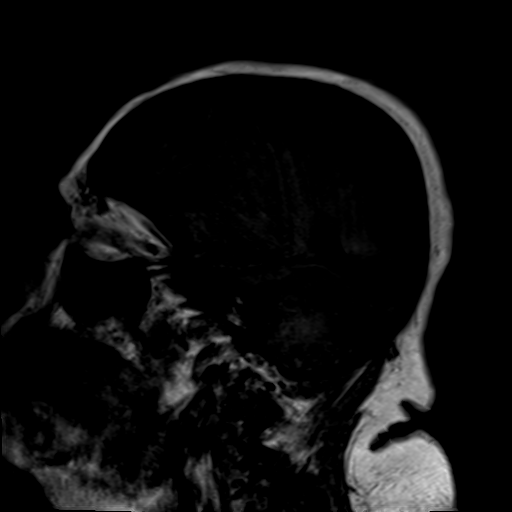
[im 17/26]
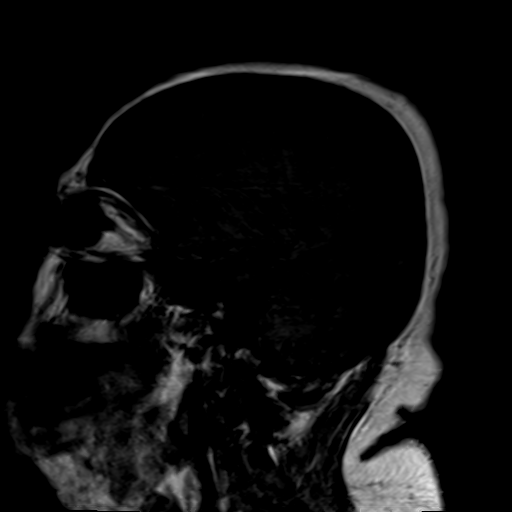
[im 26/26]
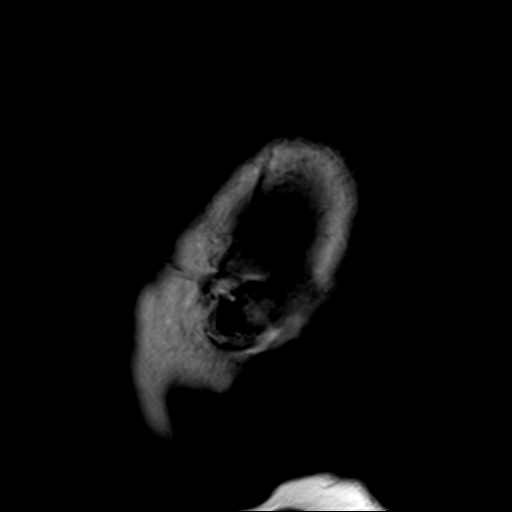

[Series 4: DWI · axial · 3.0mm · 1.80mm/px · z∈[-33,+125]mm · 8 of 53 slices shown (1 of 4)]
[im 1/53]
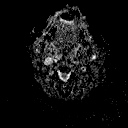
[im 8/53]
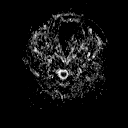
[im 15/53]
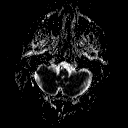
[im 23/53]
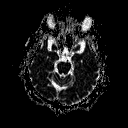
[im 30/53]
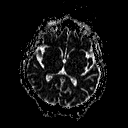
[im 38/53]
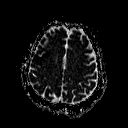
[im 45/53]
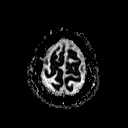
[im 53/53]
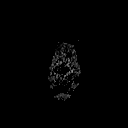

[Series 6: DWI · coronal · 3.0mm · 1.80mm/px · 7 of 56 slices shown (2 of 4)]
[im 1/56]
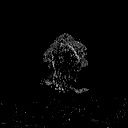
[im 10/56]
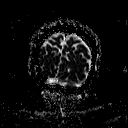
[im 19/56]
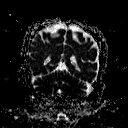
[im 28/56]
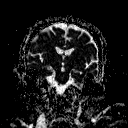
[im 37/56]
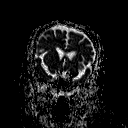
[im 46/56]
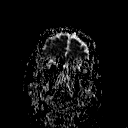
[im 56/56]
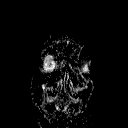

[Series 7: T2 · axial · 5.0mm · 0.45mm/px · z∈[-31,+121]mm · 3 of 25 slices shown (1 of 3)]
[im 1/25]
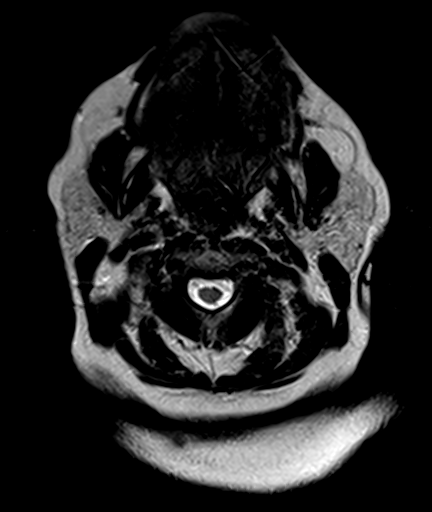
[im 13/25]
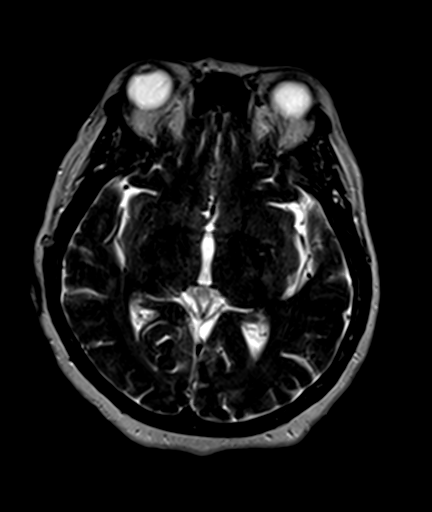
[im 25/25]
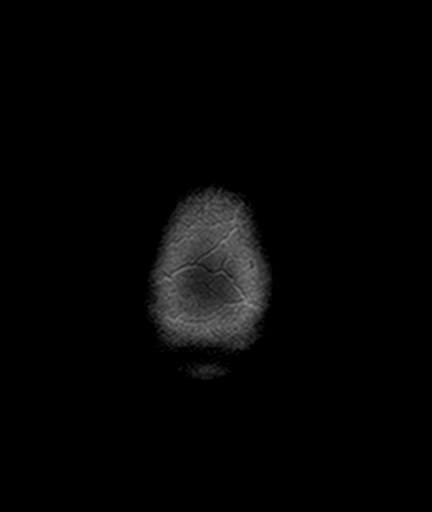

[Series 8: T2 · axial · 5.0mm · 1.20mm/px · z∈[-30,+122]mm · 3 of 25 slices shown (2 of 3)]
[im 1/25]
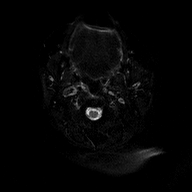
[im 13/25]
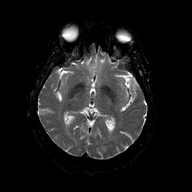
[im 25/25]
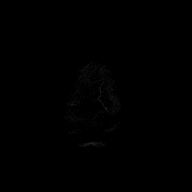

[Series 9: GRE · axial · 5.0mm · 0.45mm/px · z∈[-30,+122]mm · 3 of 25 slices shown]
[im 1/25]
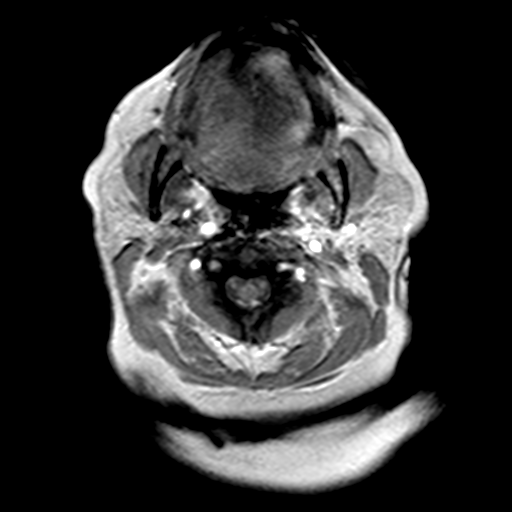
[im 13/25]
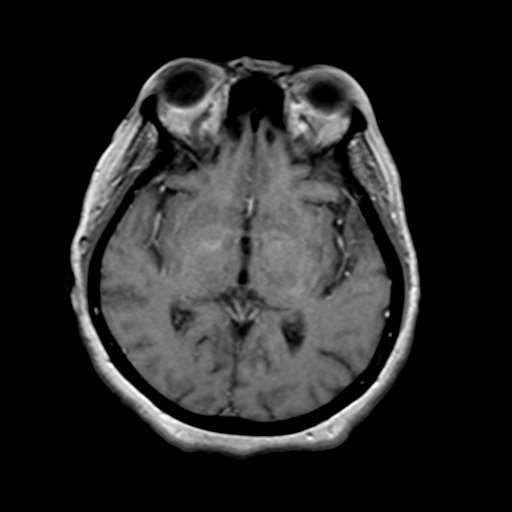
[im 25/25]
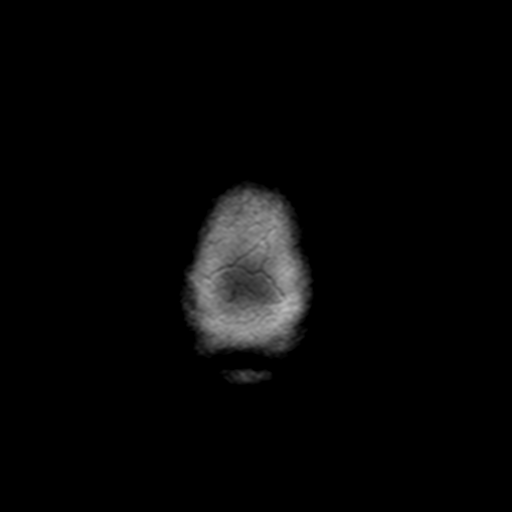

[Series 10: T2 · coronal · 5.0mm · 0.45mm/px · 3 of 27 slices shown (3 of 3)]
[im 1/27]
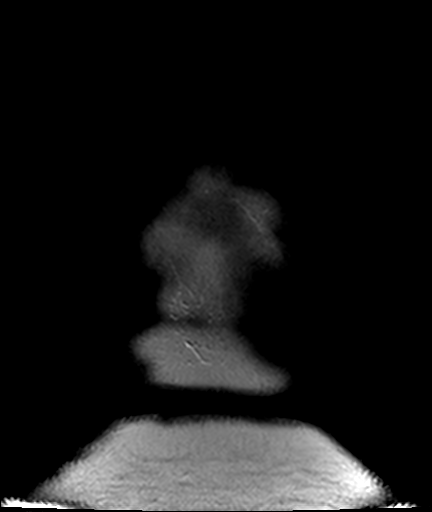
[im 14/27]
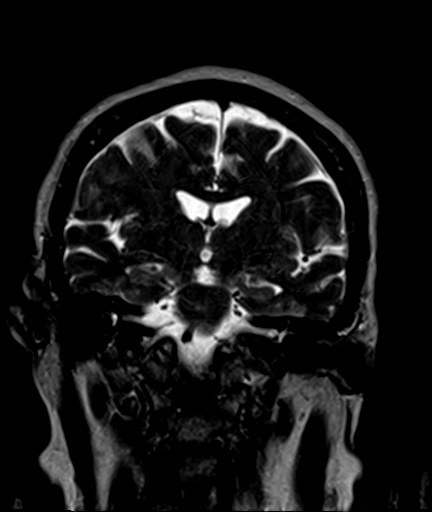
[im 27/27]
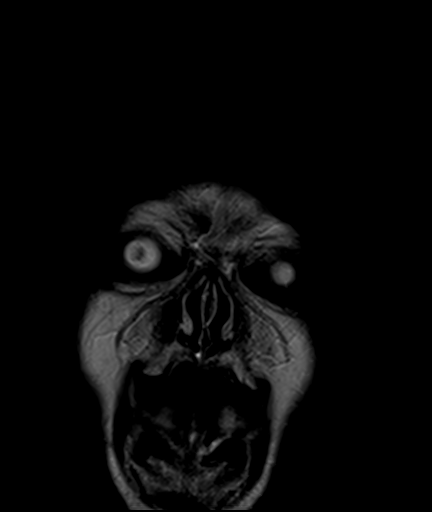

[Series 11: FLAIR · axial · 5.0mm · 0.45mm/px · z∈[-32,+120]mm · 3 of 25 slices shown]
[im 1/25]
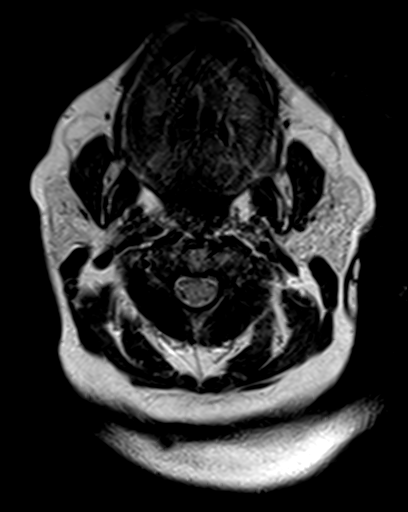
[im 13/25]
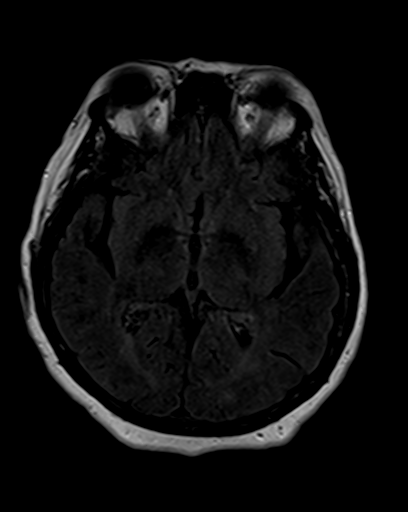
[im 25/25]
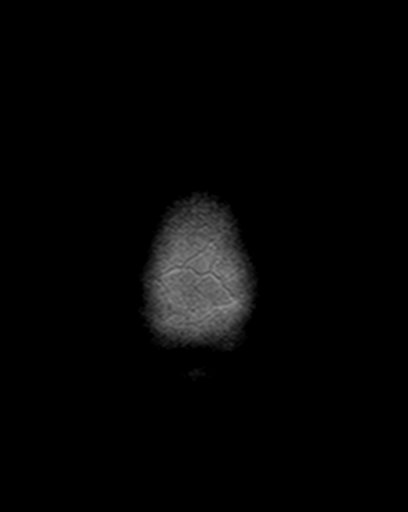

[Series 100: DWI · axial · 3.0mm · 1.80mm/px · z∈[-33,+125]mm · 7 of 55 slices shown (3 of 4)]
[im 1/55]
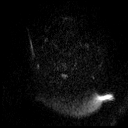
[im 10/55]
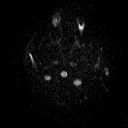
[im 19/55]
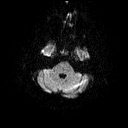
[im 28/55]
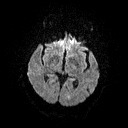
[im 37/55]
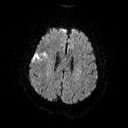
[im 46/55]
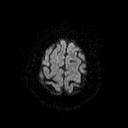
[im 55/55]
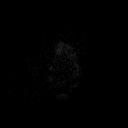

[Series 101: DWI · coronal · 3.0mm · 1.80mm/px · 7 of 56 slices shown (4 of 4)]
[im 1/56]
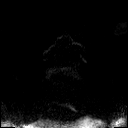
[im 10/56]
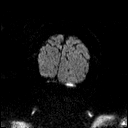
[im 19/56]
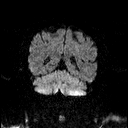
[im 28/56]
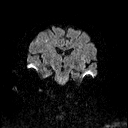
[im 37/56]
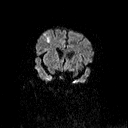
[im 46/56]
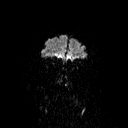
[im 56/56]
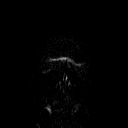

[48 of 48 positions shown; findings below may reference images not displayed]

FINDINGS: The patient was unable to remain motionless for the exam. Small or
subtle lesions could be overlooked.

Restricted diffusion affects a moderate-sized area of the RIGHT
frontal cortex, and RIGHT temporal cortex, as well as associated
subcortical white matter, consistent with acute infarction. A second
smaller area of acute infarction affects the LEFT occipital cortex
and subcortical white matter. No brainstem or cerebellar
involvement.

Generalized atrophy. Mild chronic microvascular ischemic change
affects the periventricular and subcortical white matter, and also
the pons. No acute or chronic hemorrhage. Flow voids are maintained
in the carotid, basilar, and vertebral arteries. Extracranial soft
tissues are unremarkable.

Compared with the recent CT, the infarcts are not visible.
IMPRESSION: Acute RIGHT frontotemporal infarct. Second area of acute LEFT
occipital infarct is noted.

No complicating hemorrhage.  No visible vascular occlusion.

## 2016-06-13 ENCOUNTER — Ambulatory Visit: Payer: Medicare HMO | Admitting: Anesthesiology

## 2016-06-13 ENCOUNTER — Encounter: Payer: Self-pay | Admitting: *Deleted

## 2016-06-13 ENCOUNTER — Encounter
Admission: RE | Disposition: A | Discharge status: Home / Self Care | Payer: Self-pay | Source: Ambulatory Visit | Attending: Gastroenterology

## 2016-06-13 ENCOUNTER — Ambulatory Visit
Admission: RE | Admit: 2016-06-13 | Discharge: 2016-06-13 | Disposition: A | Discharge status: Home / Self Care | Payer: Medicare HMO | Source: Ambulatory Visit | Attending: Gastroenterology | Admitting: Gastroenterology

## 2016-06-13 DIAGNOSIS — K644 Residual hemorrhoidal skin tags: Secondary | ICD-10-CM | POA: Diagnosis not present

## 2016-06-13 DIAGNOSIS — Z7984 Long term (current) use of oral hypoglycemic drugs: Secondary | ICD-10-CM | POA: Diagnosis not present

## 2016-06-13 DIAGNOSIS — K219 Gastro-esophageal reflux disease without esophagitis: Secondary | ICD-10-CM | POA: Diagnosis not present

## 2016-06-13 DIAGNOSIS — K625 Hemorrhage of anus and rectum: Secondary | ICD-10-CM | POA: Insufficient documentation

## 2016-06-13 DIAGNOSIS — D124 Benign neoplasm of descending colon: Secondary | ICD-10-CM | POA: Insufficient documentation

## 2016-06-13 DIAGNOSIS — E119 Type 2 diabetes mellitus without complications: Secondary | ICD-10-CM | POA: Insufficient documentation

## 2016-06-13 DIAGNOSIS — K635 Polyp of colon: Secondary | ICD-10-CM | POA: Diagnosis not present

## 2016-06-13 DIAGNOSIS — K623 Rectal prolapse: Secondary | ICD-10-CM | POA: Diagnosis not present

## 2016-06-13 DIAGNOSIS — Z800 Family history of malignant neoplasm of digestive organs: Secondary | ICD-10-CM | POA: Diagnosis not present

## 2016-06-13 DIAGNOSIS — K5730 Diverticulosis of large intestine without perforation or abscess without bleeding: Secondary | ICD-10-CM | POA: Insufficient documentation

## 2016-06-13 DIAGNOSIS — Z79899 Other long term (current) drug therapy: Secondary | ICD-10-CM | POA: Insufficient documentation

## 2016-06-13 DIAGNOSIS — Z87891 Personal history of nicotine dependence: Secondary | ICD-10-CM | POA: Insufficient documentation

## 2016-06-13 DIAGNOSIS — J45909 Unspecified asthma, uncomplicated: Secondary | ICD-10-CM | POA: Diagnosis not present

## 2016-06-13 DIAGNOSIS — I10 Essential (primary) hypertension: Secondary | ICD-10-CM | POA: Diagnosis not present

## 2016-06-13 DIAGNOSIS — K641 Second degree hemorrhoids: Secondary | ICD-10-CM | POA: Diagnosis not present

## 2016-06-13 DIAGNOSIS — K5790 Diverticulosis of intestine, part unspecified, without perforation or abscess without bleeding: Secondary | ICD-10-CM | POA: Diagnosis not present

## 2016-06-13 DIAGNOSIS — Z1211 Encounter for screening for malignant neoplasm of colon: Secondary | ICD-10-CM | POA: Diagnosis not present

## 2016-06-13 DIAGNOSIS — Z8673 Personal history of transient ischemic attack (TIA), and cerebral infarction without residual deficits: Secondary | ICD-10-CM | POA: Insufficient documentation

## 2016-06-13 DIAGNOSIS — K648 Other hemorrhoids: Secondary | ICD-10-CM | POA: Diagnosis not present

## 2016-06-13 HISTORY — PX: COLONOSCOPY WITH PROPOFOL: SHX5780

## 2016-06-13 LAB — GLUCOSE, CAPILLARY: GLUCOSE-CAPILLARY: 151 mg/dL — AB (ref 65–99)

## 2016-06-13 SURGERY — COLONOSCOPY WITH PROPOFOL
Anesthesia: General

## 2016-06-13 MED ORDER — SODIUM CHLORIDE 0.9 % IV SOLN
INTRAVENOUS | Status: DC
  Administered 2016-06-13 (×2): via INTRAVENOUS

## 2016-06-13 MED ORDER — PHENYLEPHRINE HCL 10 MG/ML IJ SOLN
INTRAMUSCULAR | Status: AC
  Filled 2016-06-13: qty 1

## 2016-06-13 MED ORDER — PROPOFOL 500 MG/50ML IV EMUL
INTRAVENOUS | Status: AC
  Filled 2016-06-13: qty 50

## 2016-06-13 MED ORDER — PROPOFOL 10 MG/ML IV BOLUS
INTRAVENOUS | Status: DC | PRN
  Administered 2016-06-13: 60 mg via INTRAVENOUS
  Administered 2016-06-13: 30 mg via INTRAVENOUS

## 2016-06-13 MED ORDER — PROPOFOL 500 MG/50ML IV EMUL
INTRAVENOUS | Status: DC | PRN
  Administered 2016-06-13: 125 ug/kg/min via INTRAVENOUS

## 2016-06-13 MED ORDER — PHENYLEPHRINE HCL 10 MG/ML IJ SOLN
INTRAMUSCULAR | Status: DC | PRN
  Administered 2016-06-13 (×2): 100 ug via INTRAVENOUS

## 2016-06-13 MED ORDER — SODIUM CHLORIDE 0.9 % IV SOLN: INTRAVENOUS | Status: DC

## 2016-06-13 NOTE — Transfer of Care (Signed)
Immediate Anesthesia Transfer of Care Note  Patient: Alexis Greer  Procedure(s) Performed: Procedure(s): COLONOSCOPY WITH PROPOFOL (N/A)  Patient Location: PACU  Anesthesia Type:General  Level of Consciousness: awake and sedated  Airway & Oxygen Therapy: Patient Spontanous Breathing and Patient connected to nasal cannula oxygen  Post-op Assessment: Report given to RN and Post -op Vital signs reviewed and stable  Post vital signs: Reviewed and stable  Last Vitals:  Vitals:   06/13/16 0806 06/13/16 0907  BP: (!) 146/75 100/69  Pulse: 82 79  Resp: 14 (!) 28  Temp: 36.8 C 36.3 C    Last Pain:  Vitals:   06/13/16 0907  TempSrc: Tympanic         Complications: No apparent anesthesia complications

## 2016-06-13 NOTE — Anesthesia Postprocedure Evaluation (Signed)
Anesthesia Post Note  Patient: Alexis Greer  Procedure(s) Performed: Procedure(s) (LRB): COLONOSCOPY WITH PROPOFOL (N/A)  Patient location during evaluation: Endoscopy Anesthesia Type: General Level of consciousness: awake and alert Pain management: pain level controlled Vital Signs Assessment: post-procedure vital signs reviewed and stable Respiratory status: spontaneous breathing and respiratory function stable Cardiovascular status: stable Anesthetic complications: no     Last Vitals:  Vitals:   06/13/16 0806 06/13/16 0907  BP: (!) 146/75 100/69  Pulse: 82 79  Resp: 14 (!) 28  Temp: 36.8 C 36.3 C    Last Pain:  Vitals:   06/13/16 0907  TempSrc: Tympanic                 KEPHART,WILLIAM K

## 2016-06-13 NOTE — Anesthesia Post-op Follow-up Note (Cosign Needed)
Anesthesia QCDR form completed.        

## 2016-06-13 NOTE — Anesthesia Preprocedure Evaluation (Signed)
Anesthesia Evaluation  Patient identified by MRN, date of birth, ID band Patient awake    Reviewed: Allergy & Precautions, NPO status , Patient's Chart, lab work & pertinent test results  History of Anesthesia Complications (+) history of anesthetic complications (?TIAs with breast bx)  Airway Mallampati: II       Dental  (+) Upper Dentures, Lower Dentures   Pulmonary asthma , former smoker,           Cardiovascular hypertension, Pt. on medications      Neuro/Psych CVA (speech, leg weakness), No Residual Symptoms    GI/Hepatic Neg liver ROS, GERD  Medicated and Controlled,  Endo/Other  diabetes, Type 2, Oral Hypoglycemic Agents  Renal/GU negative Renal ROS     Musculoskeletal   Abdominal   Peds  Hematology   Anesthesia Other Findings   Reproductive/Obstetrics                             Anesthesia Physical Anesthesia Plan  ASA: III  Anesthesia Plan: General   Post-op Pain Management:    Induction: Intravenous  Airway Management Planned:   Additional Equipment:   Intra-op Plan:   Post-operative Plan:   Informed Consent: I have reviewed the patients History and Physical, chart, labs and discussed the procedure including the risks, benefits and alternatives for the proposed anesthesia with the patient or authorized representative who has indicated his/her understanding and acceptance.     Plan Discussed with:   Anesthesia Plan Comments:         Anesthesia Quick Evaluation

## 2016-06-13 NOTE — H&P (Signed)
Outpatient short stay form Pre-procedure 06/13/2016 8:12 AM Lollie Sails MD  Primary Physician: Dr. Juluis Pitch  Reason for visit:  Colonoscopy  History of present illness:  Patient is a 76 year old female presenting today as above. She has a history of some rectal bleeding that began this past December. He continues. This is intermittent in nature. Of note that she's had no change in the caliber quality of her stool. She is got no slimy stools. However her last colonoscopy was about 9 years ago. There is a family history of colon cancer in a primary relative. She tolerated her prep well. She takes no aspirin or blood thinning agents with the exception of 81 mg aspirin.    Current Facility-Administered Medications:  .  0.9 %  sodium chloride infusion, , Intravenous, Continuous, Lollie Sails, MD  Prescriptions Prior to Admission  Medication Sig Dispense Refill Last Dose  . allopurinol (ZYLOPRIM) 100 MG tablet Take 200 mg by mouth daily.   06/12/2016 at Unknown time  . amLODipine (NORVASC) 10 MG tablet Take 10 mg by mouth at bedtime.    06/12/2016 at Unknown time  . aspirin EC 81 MG tablet Take 81 mg by mouth daily.   Past Week at Unknown time  . atorvastatin (LIPITOR) 80 MG tablet Take 1 tablet (80 mg total) by mouth daily. 30 tablet 0 06/12/2016 at Unknown time  . carvedilol (COREG) 12.5 MG tablet Take 12.5 mg by mouth 2 (two) times daily.   06/12/2016 at Unknown time  . Cholecalciferol (D-5000) 5000 UNITS TABS Take 5,000 Units by mouth every morning.    Past Week at Unknown time  . Cyanocobalamin-Salcaprozate (ELIGEN B12) 1000-100 MCG-MG TABS Take by mouth daily.   Past Week at Unknown time  . ferrous sulfate 325 (65 FE) MG tablet Take 325 mg by mouth daily.   Past Week at Unknown time  . glimepiride (AMARYL) 4 MG tablet Take 4 mg by mouth daily.    06/11/2016 at Unknown time  . insulin glargine (LANTUS) 100 UNIT/ML injection Inject 37 Units into the skin daily.    06/11/2016 at  Unknown time  . LUBRICANT EYE DROPS 0.4-0.3 % SOLN    06/12/2016 at Unknown time  . Multiple Vitamin (MULTI-VITAMINS) TABS Take by mouth.   Past Week at Unknown time  . omeprazole (PRILOSEC) 20 MG capsule Take 40 mg by mouth every morning.    06/12/2016 at Unknown time  . vitamin E 400 UNIT capsule    Past Week at Unknown time  . chlorthalidone (HYGROTON) 25 MG tablet Take 25 mg by mouth every morning.    Taking     Allergies  Allergen Reactions  . Oxycodone Other (See Comments)    It made the patient have shakes and jittery      Past Medical History:  Diagnosis Date  . Arthritis   . Asthma   . Breast cancer ([place]) 11/17/14   right breast lumpectomy, triple negative, with mammosite tx. Chemo with held due to CVA after lumpectomy  . Complication of anesthesia    had a stroke during the breast lumpectomy  . Diabetes mellitus without complication ([place])   . Hyperlipidemia   . Hypertension   . Stroke [facility]) 2016   post lumpectomy    Review of systems:      Physical Exam    Heart and lungs: Regular rate and rhythm without rub or gallop, lungs are bilaterally clear.    HEENT: Normocephalic atraumatic eyes are anicteric  Other:     Pertinant exam for procedure: Soft nontender nondistended bowel sounds positive normoactive.    Planned proceedures: Colonoscopy and indicated procedures.  I have discussed the risks benefits and complications of procedures to include not limited to bleeding, infection, perforation and the risk of sedation and the patient wishes to proceed.  Lollie Sails, MD Gastroenterology

## 2016-06-13 NOTE — Anesthesia Procedure Notes (Signed)
Date/Time: 06/13/2016 8:40 AM Performed by: Nelda Marseille Pre-anesthesia Checklist: Patient identified, Emergency Drugs available, Suction available, Patient being monitored and Timeout performed Oxygen Delivery Method: Simple face mask

## 2016-06-13 NOTE — Op Note (Signed)
[facility] Gastroenterology Patient Name: Alexis Greer Procedure Date: 06/13/2016 8:10 AM MRN: [id number] Account #: [id number] Date of Birth: [birth year] Admit Type: Outpatient Age: 76 Room: [facility] ENDO ROOM 3 Gender: Female Note Status: Finalized Procedure:            Colonoscopy Indications:          Rectal bleeding, Family history of colon cancer in a                        first-degree relative Providers:            Lollie Sails, MD Referring MD:         Youlanda Roys. Lovie Macadamia, MD (Referring MD) Medicines:            Monitored Anesthesia Care Complications:        No immediate complications. Procedure:            Pre-Anesthesia Assessment:                       - ASA Grade Assessment: III - A patient with severe                        systemic disease.                       After obtaining informed consent, the colonoscope was                        passed under direct vision. Throughout the procedure,                        the patient's blood pressure, pulse, and oxygen                        saturations were monitored continuously. The                        Colonoscope was introduced through the anus and                        advanced to the the cecum, identified by appendiceal                        orifice and ileocecal valve. The colonoscopy was                        performed without difficulty. The patient tolerated the                        procedure well. The quality of the bowel preparation                        was fair. Findings:      A few small-mouthed diverticula were found in the sigmoid colon and       distal descending colon.      Two sessile polyps were found in the descending colon. The polyps were 2       to 3 mm in size. These polyps were removed with a cold biopsy forceps.       Resection and retrieval were complete.      A 6  mm polyp was found in the descending colon. The polyp was sessile.       The polyp was removed  with a cold snare. Resection and retrieval were       complete.      A 2 mm polyp was found in the proximal sigmoid colon. The polyp was       sessile. The polyp was removed with a cold biopsy forceps. Resection and       retrieval were complete.      External and internal hemorrhoids were found during retroflexion and       during anoscopy. The hemorrhoids were small and Grade II (internal       hemorrhoids that prolapse but reduce spontaneously).      There is what appears to be an inflamed anal pillar protruding from the       anal orifice with obvious erosion. There is no apparent mass otherwise.      The exam was otherwise without abnormality. Impression:           - Preparation of the colon was fair.                       - Diverticulosis in the sigmoid colon and in the distal                        descending colon.                       - Two 2 to 3 mm polyps in the descending colon, removed                        with a cold biopsy forceps. Resected and retrieved.                       - One 6 mm polyp in the descending colon, removed with                        a cold snare. Resected and retrieved.                       - One 2 mm polyp in the proximal sigmoid colon, removed                        with a cold biopsy forceps. Resected and retrieved.                       - External and internal hemorrhoids.                       - The examination was otherwise normal. Recommendation:       - Refer to a colo-rectal surgeon at appointment to be                        scheduled. Procedure Code(s):    --- Professional ---                       [phone], Colonoscopy, flexible; with removal of tumor(s),                        polyp(s), or other lesion(s) by snare  technique                       [id number], 59, Colonoscopy, flexible; with biopsy, single                        or multiple Diagnosis Code(s):    --- Professional ---                       K64.1, Second degree hemorrhoids                        D12.4, Benign neoplasm of descending colon                       D12.5, Benign neoplasm of sigmoid colon                       K62.5, Hemorrhage of anus and rectum                       Z80.0, Family history of malignant neoplasm of                        digestive organs                       K57.30, Diverticulosis of large intestine without                        perforation or abscess without bleeding CPT copyright 2016 American Medical Association. All rights reserved. The codes documented in this report are preliminary and upon coder review may  be revised to meet current compliance requirements. Lollie Sails, MD 06/13/2016 9:09:57 AM This report has been signed electronically. Number of Addenda: 0 Note Initiated On: 06/13/2016 8:10 AM Scope Withdrawal Time: 0 hours 16 minutes 6 seconds  Total Procedure Duration: 0 hours 23 minutes 13 seconds       [facility]

## 2016-06-14 ENCOUNTER — Encounter: Payer: Self-pay | Admitting: Gastroenterology

## 2016-06-14 DIAGNOSIS — K648 Other hemorrhoids: Secondary | ICD-10-CM | POA: Diagnosis not present

## 2016-06-14 DIAGNOSIS — K620 Anal polyp: Secondary | ICD-10-CM | POA: Diagnosis not present

## 2016-06-14 LAB — SURGICAL PATHOLOGY

## 2016-06-21 DIAGNOSIS — I10 Essential (primary) hypertension: Secondary | ICD-10-CM | POA: Diagnosis not present

## 2016-06-21 DIAGNOSIS — Z13220 Encounter for screening for lipoid disorders: Secondary | ICD-10-CM | POA: Diagnosis not present

## 2016-06-21 DIAGNOSIS — R7302 Impaired glucose tolerance (oral): Secondary | ICD-10-CM | POA: Diagnosis not present

## 2016-06-26 DIAGNOSIS — K635 Polyp of colon: Secondary | ICD-10-CM | POA: Diagnosis not present

## 2016-06-26 DIAGNOSIS — K648 Other hemorrhoids: Secondary | ICD-10-CM | POA: Diagnosis not present

## 2016-06-26 DIAGNOSIS — K620 Anal polyp: Secondary | ICD-10-CM | POA: Diagnosis not present

## 2016-06-28 DIAGNOSIS — C50211 Malignant neoplasm of upper-inner quadrant of right female breast: Secondary | ICD-10-CM | POA: Diagnosis not present

## 2016-06-28 DIAGNOSIS — E1122 Type 2 diabetes mellitus with diabetic chronic kidney disease: Secondary | ICD-10-CM | POA: Diagnosis not present

## 2016-06-28 DIAGNOSIS — N289 Disorder of kidney and ureter, unspecified: Secondary | ICD-10-CM | POA: Diagnosis not present

## 2016-06-28 DIAGNOSIS — E782 Mixed hyperlipidemia: Secondary | ICD-10-CM | POA: Diagnosis not present

## 2016-06-28 DIAGNOSIS — I10 Essential (primary) hypertension: Secondary | ICD-10-CM | POA: Diagnosis not present

## 2016-06-28 DIAGNOSIS — E119 Type 2 diabetes mellitus without complications: Secondary | ICD-10-CM | POA: Diagnosis not present

## 2016-06-28 DIAGNOSIS — Z794 Long term (current) use of insulin: Secondary | ICD-10-CM | POA: Diagnosis not present

## 2016-06-28 DIAGNOSIS — Z0000 Encounter for general adult medical examination without abnormal findings: Secondary | ICD-10-CM | POA: Diagnosis not present

## 2016-06-29 ENCOUNTER — Encounter: Payer: Self-pay | Admitting: Radiation Oncology

## 2016-06-29 ENCOUNTER — Ambulatory Visit
Admission: RE | Admit: 2016-06-29 | Discharge: 2016-06-29 | Disposition: A | Discharge status: Home / Self Care | Payer: Medicare HMO | Source: Ambulatory Visit | Attending: Radiation Oncology | Admitting: Radiation Oncology

## 2016-06-29 VITALS — BP 130/66 | HR 79 | Temp 96.7°F | Resp 20 | Wt 241.4 lb

## 2016-06-29 DIAGNOSIS — Z171 Estrogen receptor negative status [ER-]: Secondary | ICD-10-CM

## 2016-06-29 DIAGNOSIS — C50211 Malignant neoplasm of upper-inner quadrant of right female breast: Secondary | ICD-10-CM

## 2016-06-29 DIAGNOSIS — Z923 Personal history of irradiation: Secondary | ICD-10-CM | POA: Insufficient documentation

## 2016-06-29 DIAGNOSIS — Z853 Personal history of malignant neoplasm of breast: Secondary | ICD-10-CM | POA: Insufficient documentation

## 2016-06-29 NOTE — Progress Notes (Signed)
Radiation Oncology Follow up Note  Name: Alexis Greer   Date:   06/29/2016 MRN:  [id number] DOB: 1940-06-18    This 76 y.o. female presents to the clinic today for 18 month follow-up status post accelerated partial breast irradiation to her right breast for stage I invasive mammary carcinoma.  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 She is seen today in routine follow-up and is doing well. She specifically denies breast tenderness cough or bone pain..her last mammograms were back in August were fine she scheduled for repeat mammograms this August. Based on the triple negative nature of her disease she is not on antiestrogen therapy.  COMPLICATIONS OF TREATMENT: none  FOLLOW UP COMPLIANCE: keeps appointments   PHYSICAL EXAM:  BP 130/66   Pulse 79   Temp (!) 96.7 F (35.9 C)   Resp 20   Wt 241 lb 6.5 oz (109.5 kg)   BMI 44.15 kg/m  Lungs are clear to A&P cardiac examination essentially unremarkable with regular rate and rhythm. No dominant mass or nodularity is noted in either breast in 2 positions examined. Incision is well-healed. No axillary or supraclavicular adenopathy is appreciated. Cosmetic result is excellent. Well-developed well-nourished patient in NAD. HEENT reveals PERLA, EOMI, discs not visualized.  Oral cavity is clear. No oral mucosal lesions are identified. Neck is clear without evidence of cervical or supraclavicular adenopathy. Lungs are clear to A&P. Cardiac examination is essentially unremarkable with regular rate and rhythm without murmur rub or thrill. Abdomen is benign with no organomegaly or masses noted. Motor sensory and DTR levels are equal and symmetric in the upper and lower extremities. Cranial nerves II through XII are grossly intact. Proprioception is intact. No  peripheral adenopathy or edema is identified. No motor or sensory levels are noted. Crude visual fields are within normal range.  RADIOLOGY RESULTS: mammograms are reviewed and compatible with the above-stated findings  PLAN: present time she continues to do well with no evidence of disease. I've asked to see her back in 1 year for follow-up. She will have mammograms in August as a rescheduled. She is not on antiestrogen therapy. Patient knows to call with any concerns.  I would like to take this opportunity to thank you for allowing me to participate in the care of your patient.Armstead Peaks., MD

## 2016-07-26 DIAGNOSIS — K648 Other hemorrhoids: Secondary | ICD-10-CM | POA: Diagnosis not present

## 2016-07-26 DIAGNOSIS — K620 Anal polyp: Secondary | ICD-10-CM | POA: Diagnosis not present

## 2016-09-11 ENCOUNTER — Other Ambulatory Visit: Payer: Self-pay

## 2016-09-11 DIAGNOSIS — Z171 Estrogen receptor negative status [ER-]: Principal | ICD-10-CM

## 2016-09-11 DIAGNOSIS — C50211 Malignant neoplasm of upper-inner quadrant of right female breast: Secondary | ICD-10-CM

## 2016-10-05 DIAGNOSIS — M79672 Pain in left foot: Secondary | ICD-10-CM | POA: Diagnosis not present

## 2016-10-05 DIAGNOSIS — L089 Local infection of the skin and subcutaneous tissue, unspecified: Secondary | ICD-10-CM | POA: Diagnosis not present

## 2016-10-05 DIAGNOSIS — D481 Neoplasm of uncertain behavior of connective and other soft tissue: Secondary | ICD-10-CM | POA: Diagnosis not present

## 2016-10-05 DIAGNOSIS — L905 Scar conditions and fibrosis of skin: Secondary | ICD-10-CM | POA: Diagnosis not present

## 2016-10-05 DIAGNOSIS — D1801 Hemangioma of skin and subcutaneous tissue: Secondary | ICD-10-CM | POA: Diagnosis not present

## 2016-10-05 DIAGNOSIS — R238 Other skin changes: Secondary | ICD-10-CM | POA: Diagnosis not present

## 2016-10-19 DIAGNOSIS — D1800 Hemangioma unspecified site: Secondary | ICD-10-CM | POA: Diagnosis not present

## 2016-10-27 DIAGNOSIS — I10 Essential (primary) hypertension: Secondary | ICD-10-CM | POA: Diagnosis not present

## 2016-10-27 DIAGNOSIS — Z13220 Encounter for screening for lipoid disorders: Secondary | ICD-10-CM | POA: Diagnosis not present

## 2016-10-27 DIAGNOSIS — R7302 Impaired glucose tolerance (oral): Secondary | ICD-10-CM | POA: Diagnosis not present

## 2016-11-01 DIAGNOSIS — E119 Type 2 diabetes mellitus without complications: Secondary | ICD-10-CM | POA: Diagnosis not present

## 2016-11-01 DIAGNOSIS — I10 Essential (primary) hypertension: Secondary | ICD-10-CM | POA: Diagnosis not present

## 2016-11-01 DIAGNOSIS — E782 Mixed hyperlipidemia: Secondary | ICD-10-CM | POA: Diagnosis not present

## 2016-11-01 DIAGNOSIS — Z794 Long term (current) use of insulin: Secondary | ICD-10-CM | POA: Diagnosis not present

## 2016-11-01 DIAGNOSIS — D649 Anemia, unspecified: Secondary | ICD-10-CM | POA: Diagnosis not present

## 2016-11-03 ENCOUNTER — Inpatient Hospital Stay: Admission: RE | Admit: 2016-11-03 | Payer: Medicare HMO | Source: Ambulatory Visit

## 2016-11-03 DIAGNOSIS — D489 Neoplasm of uncertain behavior, unspecified: Secondary | ICD-10-CM | POA: Diagnosis not present

## 2016-11-03 DIAGNOSIS — D1800 Hemangioma unspecified site: Secondary | ICD-10-CM | POA: Diagnosis not present

## 2016-11-07 ENCOUNTER — Other Ambulatory Visit (INDEPENDENT_AMBULATORY_CARE_PROVIDER_SITE_OTHER): Payer: Self-pay

## 2016-11-07 ENCOUNTER — Encounter (INDEPENDENT_AMBULATORY_CARE_PROVIDER_SITE_OTHER): Payer: Self-pay

## 2016-11-07 ENCOUNTER — Other Ambulatory Visit: Payer: Self-pay | Admitting: Neurology

## 2016-11-07 ENCOUNTER — Encounter (INDEPENDENT_AMBULATORY_CARE_PROVIDER_SITE_OTHER): Payer: Self-pay | Admitting: Vascular Surgery

## 2016-11-07 ENCOUNTER — Ambulatory Visit (INDEPENDENT_AMBULATORY_CARE_PROVIDER_SITE_OTHER): Payer: Medicare HMO | Admitting: Vascular Surgery

## 2016-11-07 VITALS — BP 145/76 | HR 78 | Resp 16 | Ht 62.0 in | Wt 241.0 lb

## 2016-11-07 DIAGNOSIS — D1801 Hemangioma of skin and subcutaneous tissue: Secondary | ICD-10-CM

## 2016-11-07 DIAGNOSIS — G35 Multiple sclerosis: Secondary | ICD-10-CM

## 2016-11-07 DIAGNOSIS — D1800 Hemangioma unspecified site: Secondary | ICD-10-CM | POA: Insufficient documentation

## 2016-11-07 DIAGNOSIS — E119 Type 2 diabetes mellitus without complications: Secondary | ICD-10-CM | POA: Diagnosis not present

## 2016-11-07 DIAGNOSIS — E785 Hyperlipidemia, unspecified: Secondary | ICD-10-CM | POA: Diagnosis not present

## 2016-11-07 DIAGNOSIS — I10 Essential (primary) hypertension: Secondary | ICD-10-CM | POA: Diagnosis not present

## 2016-11-07 NOTE — Patient Instructions (Signed)
Cavernous Malformation, Adult A cavernous malformation is an abnormal formation of blood vessels that are more likely than regular blood vessels to bleed (hemorrhage) and cause other problems. A cavernous malformation most often develops in or beneath the skin, but it can also develop in internal organs. It can be very dangerous if it develops in the brain or the spinal cord. Most cavernous malformations are present since birth (congenital). What are the causes? This condition may be caused by a gene that is passed down from parent to child (inherited). In some cases, the cause is not known. What increases the risk? A family history of cavernous malformation is the only known risk factor. What are the signs or symptoms? Symptoms of this condition include:  Headache.  Nausea or vomiting.  Dizziness.  Vision or hearing changes.  Seizure.  Weakness or loss of movement in a part of the body.  Tingling or numbness in part of the body.  Problems with speech.  Clumsiness.  Confusion or loss of memory.  Symptoms usually develop only if specific areas of the brain or spinal cord are affected. How is this diagnosed? This condition is diagnosed with an imaging test, such as MRI or a CT scan. How is this treated? Treatment for this condition depends on whether symptoms are present. If you do not have symptoms or a malformation that has caused bleeding, you may only need to have regular MRIs to watch the malformation over time. You may also get medicines to treat any seizures or headaches. If you have symptoms or a malformation that has caused bleeding, surgery to remove the cavernous malformation (craniotomy) will be needed. In areas that cannot be safely operated on, a focused beam of radiation (gamma knife) may be used to shrink a cavernous malformation. Follow these instructions at home:  Learn as much as you can about your condition and work closely with your team of health care  providers.  Take over-the-counter and prescription medicines only as told by your health care provider. Do not take blood thinners, such as aspirin, ibuprofen, NSAIDs, or warfarin, unless your health care provider tells you to do that.  Keep all follow-up visits as told by your health care provider. This is important. Contact a health care provider if:  Your symptoms do not improve with treatment.  You develop new symptoms.  You develop a headache that does not go away.  Your symptoms return. Get help right away if:  You have a sudden, severe headache with no known cause.  You have nausea or vomiting occurring with another symptom.  You have sudden weakness or numbness of your face, arm, or leg, especially on one side of your body.  You have sudden trouble walking or difficulty moving your arms or legs.  You have sudden confusion.  You have sudden trouble speaking, understanding, or both (aphasia).  You have sudden trouble seeing in one or both of your eyes.  You have a sudden loss of balance or coordination.  You have a stiff neck.  You have difficulty breathing.  You have a partial or total loss of consciousness. These symptoms may represent a serious problem that is an emergency. Do not wait to see if the symptoms will go away. Get medical help right away. Call your local emergency services (911 in the [place] Do not drive yourself to the hospital. This information is not intended to replace advice given to you by your health care provider. Make sure you discuss any questions you have  with your health care provider. Document Released: 12/03/2001 Document Revised: 10/01/2015 Document Reviewed: 07/23/2015 Elsevier Interactive Patient Education  [employer].

## 2016-11-07 NOTE — Assessment & Plan Note (Signed)
lipid control important in reducing the progression of atherosclerotic disease. Continue statin therapy  

## 2016-11-07 NOTE — Assessment & Plan Note (Signed)
This is symptomatic and causing her more pain and problems as time goes on her left foot. They're trying to avoid surgery if possible given her previous difficulties with surgery as well as her poorly controlled diabetes. We have discussed that embolization is a reasonable option for treatment of these hemangiomas. Sometimes it is difficult to cut off all of the inflow vessels so sure may not be possible, but decrease in the size of the hemangioma would be expected. She does have a history of chronic kidney disease we will need to limit her contrast as much as possible and make sure she is well-hydrated. We have discussed options including polyvinyl alcohol and coil embolization depending on the findings. She voices her understanding and desires to proceed with embolization.

## 2016-11-07 NOTE — Progress Notes (Signed)
MRN : [id number]  Alexis Greer is a 76 y.o. (03/27/1941) female who presents with chief complaint of  Chief Complaint  Patient presents with  . New Patient (Initial Visit)    embolism  .  History of Present Illness: I am asked to see the patient by Dr. 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 She has had this for about 5 years now and it has steadily increased in size and caused her more pain and discomfort. It is difficult for her to wear shoes and socks on at this point. She has not had any open ulcerations or infection. No fever or chills. No trauma, injury, or inciting event that started the symptoms. She denies any claudication symptoms or previous history of ulceration or rest pain. No right leg symptoms. They had scheduled her for surgery, but her hemoglobin A1c was markedly elevated and they decided to hold off on surgery.  Current Outpatient Prescriptions  Medication Sig Dispense Refill  . ACCU-CHEK AVIVA PLUS test strip     . acetaminophen (TYLENOL) 500 MG tablet Take 1,000 mg by mouth every 6 (six) hours as needed for mild pain or headache.    . allopurinol (ZYLOPRIM) 100 MG tablet Take 100 mg by mouth 2 (two) times daily.     Marland Kitchen amLODipine (NORVASC) 10 MG tablet Take 10 mg by mouth at bedtime.     Marland Kitchen aspirin EC 81 MG tablet Take 81 mg by mouth daily.    Marland Kitchen atorvastatin (LIPITOR) 40 MG tablet Take 40 mg by mouth daily at 6 PM.    . atorvastatin (LIPITOR) 80 MG tablet Take 1 tablet (80 mg total) by mouth daily. 30 tablet 0  . carvedilol (COREG) 25 MG tablet Take 25 mg by mouth daily.    . Cholecalciferol (D-5000) 5000 UNITS TABS Take 5,000 Units by mouth daily.     . ferrous sulfate 325 (65 FE) MG tablet Take 325 mg by mouth daily.    Marland Kitchen glimepiride (AMARYL) 4 MG tablet Take 4 mg by mouth daily.     . insulin glargine (LANTUS) 100 UNIT/ML injection Inject 51 Units into the skin daily.     Marland Kitchen LANTUS SOLOSTAR 100 UNIT/ML Solostar Pen     .  Menthol-Methyl Salicylate (MUSCLE RUB) 10-15 % CREA Apply 1 application topically as needed for muscle pain.    . Multiple Vitamin (MULTI-VITAMINS) TABS Take 1 tablet by mouth daily.     Marland Kitchen omeprazole (PRILOSEC) 20 MG capsule Take 40 mg by mouth daily.     . vitamin B-12 (CYANOCOBALAMIN) 1000 MCG tablet Take 1,000 mcg by mouth daily.    . vitamin E 400 UNIT capsule Take 400 Units by mouth daily.     . chlorthalidone (HYGROTON) 25 MG tablet Take 25 mg by mouth every morning.     . vitamin B-12 (CYANOCOBALAMIN) 1000 MCG tablet      No current facility-administered medications for this visit.     Past Medical History:  Diagnosis Date  . Arthritis   . Asthma   . Breast cancer ([place]) 11/17/14   right breast lumpectomy, triple negative, with mammosite tx. Chemo with held due to CVA after lumpectomy  . Complication of anesthesia    had a stroke during the breast lumpectomy  . Diabetes mellitus without complication ([place])   . Hyperlipidemia   . Hypertension   . Stroke [facility]) 2016   post lumpectomy    Past Surgical History:  Procedure  Laterality Date  . ABDOMINAL HYSTERECTOMY  1979  . BREAST BIOPSY Right 11/17/2014   Invasive mammory carcinoma  . BREAST BIOPSY Right 12/01/2014   Procedure: BREAST BIOPSY WITH NEEDLE LOCALIZATION;  Surgeon: Christene Lye, MD;  Location: [facility] ORS;  Service: General;  Laterality: Right;  . BREAST LUMPECTOMY WITH SENTINEL LYMPH NODE BIOPSY Right 12/01/2014   Procedure: BREAST LUMPECTOMY WITH SENTINEL LYMPH NODE BX;  Surgeon: Christene Lye, MD;  Location: [facility] ORS;  Service: General;  Laterality: Right;  . COLONOSCOPY WITH PROPOFOL N/A 06/13/2016   Procedure: COLONOSCOPY WITH PROPOFOL;  Surgeon: Lollie Sails, MD;  Location: [facility]COPY;  Service: Endoscopy;  Laterality: N/A;  . OOPHORECTOMY  2014    Social History Social History  Substance Use Topics  . Smoking status: Former Smoker    Quit date: 08/25/1993  . Smokeless tobacco: Never Used    . Alcohol use No     Family History Family History  Problem Relation Age of Onset  . Heart disease Mother   . Lung cancer Sister        smoker  . CAD Father   . Hyperlipidemia Father     Allergies  Allergen Reactions  . Oxycodone Other (See Comments)    It made the patient have shakes and jittery      REVIEW OF SYSTEMS (Negative unless checked)  Constitutional: [] Weight loss  [] Fever  [] Chills Cardiac: [] Chest pain   [] Chest pressure   [] Palpitations   [] Shortness of breath when laying flat   [] Shortness of breath at rest   [] Shortness of breath with exertion. Vascular:  [] Pain in legs with walking   [] Pain in legs at rest   [] Pain in legs when laying flat   [] Claudication   [] Pain in feet when walking  [] Pain in feet at rest  [] Pain in feet when laying flat   [] History of DVT   [] Phlebitis   [x] Swelling in legs   [] Varicose veins   [] Non-healing ulcers Pulmonary:   [] Uses home oxygen   [] Productive cough   [] Hemoptysis   [] Wheeze  [] COPD   [] Asthma Neurologic:  [] Dizziness  [] Blackouts   [] Seizures   [x] History of stroke   [] History of TIA  [] Aphasia   [] Temporary blindness   [] Dysphagia   [] Weakness or numbness in arms   [] Weakness or numbness in legs Musculoskeletal:  [x] Arthritis   [] Joint swelling   [x] Joint pain   [] Low back pain Hematologic:  [] Easy bruising  [] Easy bleeding   [] Hypercoagulable state   [] Anemic  [] Hepatitis Gastrointestinal:  [] Blood in stool   [] Vomiting blood  [] Gastroesophageal reflux/heartburn   [] Difficulty swallowing. Genitourinary:  [] Chronic kidney disease   [] Difficult urination  [] Frequent urination  [] Burning with urination   [] Blood in urine Skin:  [] Rashes   [] Ulcers   [] Wounds Psychological:  [] History of anxiety   []  History of major depression.  Physical Examination  Vitals:   11/07/16 0836  BP: (!) 145/76  Pulse: 78  Resp: 16  Weight: 241 lb (109.3 kg)  Height: 5\' 2"  (1.575 m)   Body mass index is 44.08 kg/m. Gen:  WD/WN,  NAD Head: [place]/AT, No temporalis wasting. Ear/Nose/Throat: Hearing grossly intact, nares w/o erythema or drainage, trachea midline Eyes: Conjunctiva clear. Sclera non-icteric Neck: Supple.  No JVD.  Pulmonary:  Good air movement, Respirations not labored, no use of accessory muscles Cardiac: RRR, normal S1, S2 Vascular:  Vessel Right Left  Radial Palpable Palpable  PT Trace Palpable Not Palpable  DP Palpable Palpable    Musculoskeletal: M/S 5/5 throughout.  No deformity or atrophy. 1+ right lower extremity edema, 2+ left lower extremity edema. Neurologic: CN 2-12 intact. Sensation grossly intact in extremities.  Symmetrical.  Speech is fluent. Motor exam as listed above. Psychiatric: Judgment intact, Mood & affect appropriate for pt's clinical situation. Dermatologic: No rashes or ulcers noted.  No cellulitis or open wounds.      CBC Lab Results  Component Value Date   WBC 7.2 12/02/2014   HGB 10.3 (L) 12/02/2014   HCT 32.2 (L) 12/02/2014   MCV 91.1 12/02/2014   PLT 286 12/02/2014    BMET    Component Value Date/Time   NA 139 12/02/2014 0537   NA 141 09/18/2012 0943   K 4.1 12/02/2014 0537   K 4.6 09/18/2012 0943   CL 111 12/02/2014 0537   CL 112 (H) 09/18/2012 0943   CO2 21 (L) 12/02/2014 0537   CO2 20 (L) 09/18/2012 0943   GLUCOSE 164 (H) 12/02/2014 0537   GLUCOSE 147 (H) 09/18/2012 0943   BUN 31 (H) 12/02/2014 0537   BUN 35 (H) 09/18/2012 0943   CREATININE 1.85 (H) 12/02/2014 0537   CREATININE 1.76 (H) 09/18/2012 0943   CALCIUM 9.6 12/02/2014 0537   CALCIUM 9.7 09/18/2012 0943   GFRNONAA 26 (L) 12/02/2014 0537   GFRNONAA 29 (L) 09/18/2012 0943   GFRAA 30 (L) 12/02/2014 0537   GFRAA 33 (L) 09/18/2012 0943   CrCl cannot be calculated (Patient's most recent lab result is older than the maximum 21 days allowed.).  COAG No results found for: INR, PROTIME  Radiology No results found.    Assessment/Plan Hypertension blood  pressure control important in reducing the progression of atherosclerotic disease. On appropriate oral medications.   Diabetes mellitus without complication (HCC) blood glucose control important in reducing the progression of atherosclerotic disease. Also, involved in wound healing. On appropriate medications.   Hyperlipidemia lipid control important in reducing the progression of atherosclerotic disease. Continue statin therapy   Cavernous hemangioma This is symptomatic and causing her more pain and problems as time goes on her left foot. They're trying to avoid surgery if possible given her previous difficulties with surgery as well as her poorly controlled diabetes. We have discussed that embolization is a reasonable option for treatment of these hemangiomas. Sometimes it is difficult to cut off all of the inflow vessels so sure may not be possible, but decrease in the size of the hemangioma would be expected. She does have a history of chronic kidney disease we will need to limit her contrast as much as possible and make sure she is well-hydrated. We have discussed options including polyvinyl alcohol and coil embolization depending on the findings. She voices her understanding and desires to proceed with embolization.    Leotis Pain, MD  11/07/2016 10:14 AM    This note was created with Dragon medical transcription system.  Any errors from dictation are purely unintentional

## 2016-11-07 NOTE — Assessment & Plan Note (Signed)
blood pressure control important in reducing the progression of atherosclerotic disease. On appropriate oral medications.  

## 2016-11-07 NOTE — Assessment & Plan Note (Signed)
blood glucose control important in reducing the progression of atherosclerotic disease. Also, involved in wound healing. On appropriate medications.  

## 2016-11-08 ENCOUNTER — Encounter
Admission: RE | Admit: 2016-11-08 | Discharge: 2016-11-08 | Disposition: A | Discharge status: Home / Self Care | Payer: Medicare HMO | Source: Ambulatory Visit | Attending: Vascular Surgery | Admitting: Vascular Surgery

## 2016-11-08 HISTORY — DX: Dyspnea, unspecified: R06.00

## 2016-11-08 MED ORDER — CEFAZOLIN SODIUM-DEXTROSE 2-4 GM/100ML-% IV SOLN
2.0000 g | Freq: Once | INTRAVENOUS | Status: AC
  Administered 2016-11-09: 2 g via INTRAVENOUS

## 2016-11-09 ENCOUNTER — Encounter
Admission: RE | Disposition: A | Discharge status: Home / Self Care | Payer: Self-pay | Source: Ambulatory Visit | Attending: Vascular Surgery

## 2016-11-09 ENCOUNTER — Encounter: Payer: Self-pay | Admitting: *Deleted

## 2016-11-09 ENCOUNTER — Ambulatory Visit
Admission: RE | Admit: 2016-11-09 | Discharge: 2016-11-09 | Disposition: A | Discharge status: Home / Self Care | Payer: Medicare HMO | Source: Ambulatory Visit | Attending: Vascular Surgery | Admitting: Vascular Surgery

## 2016-11-09 DIAGNOSIS — D1809 Hemangioma of other sites: Secondary | ICD-10-CM | POA: Insufficient documentation

## 2016-11-09 DIAGNOSIS — Z90710 Acquired absence of both cervix and uterus: Secondary | ICD-10-CM | POA: Diagnosis not present

## 2016-11-09 DIAGNOSIS — I129 Hypertensive chronic kidney disease with stage 1 through stage 4 chronic kidney disease, or unspecified chronic kidney disease: Secondary | ICD-10-CM | POA: Diagnosis not present

## 2016-11-09 DIAGNOSIS — Z79899 Other long term (current) drug therapy: Secondary | ICD-10-CM | POA: Diagnosis not present

## 2016-11-09 DIAGNOSIS — E1122 Type 2 diabetes mellitus with diabetic chronic kidney disease: Secondary | ICD-10-CM | POA: Diagnosis not present

## 2016-11-09 DIAGNOSIS — N189 Chronic kidney disease, unspecified: Secondary | ICD-10-CM | POA: Insufficient documentation

## 2016-11-09 DIAGNOSIS — M1990 Unspecified osteoarthritis, unspecified site: Secondary | ICD-10-CM | POA: Insufficient documentation

## 2016-11-09 DIAGNOSIS — Z7982 Long term (current) use of aspirin: Secondary | ICD-10-CM | POA: Diagnosis not present

## 2016-11-09 DIAGNOSIS — Z8673 Personal history of transient ischemic attack (TIA), and cerebral infarction without residual deficits: Secondary | ICD-10-CM | POA: Insufficient documentation

## 2016-11-09 DIAGNOSIS — Z885 Allergy status to narcotic agent status: Secondary | ICD-10-CM | POA: Diagnosis not present

## 2016-11-09 DIAGNOSIS — Z8249 Family history of ischemic heart disease and other diseases of the circulatory system: Secondary | ICD-10-CM | POA: Diagnosis not present

## 2016-11-09 DIAGNOSIS — Z87891 Personal history of nicotine dependence: Secondary | ICD-10-CM | POA: Diagnosis not present

## 2016-11-09 DIAGNOSIS — Z794 Long term (current) use of insulin: Secondary | ICD-10-CM | POA: Insufficient documentation

## 2016-11-09 DIAGNOSIS — E785 Hyperlipidemia, unspecified: Secondary | ICD-10-CM | POA: Diagnosis not present

## 2016-11-09 DIAGNOSIS — Z853 Personal history of malignant neoplasm of breast: Secondary | ICD-10-CM | POA: Diagnosis not present

## 2016-11-09 DIAGNOSIS — D1801 Hemangioma of skin and subcutaneous tissue: Secondary | ICD-10-CM | POA: Diagnosis not present

## 2016-11-09 DIAGNOSIS — I8290 Acute embolism and thrombosis of unspecified vein: Secondary | ICD-10-CM

## 2016-11-09 HISTORY — PX: LOWER EXTREMITY INTERVENTION: CATH118252

## 2016-11-09 HISTORY — DX: Chronic kidney disease, unspecified: N18.9

## 2016-11-09 LAB — GLUCOSE, CAPILLARY
Glucose-Capillary: 110 mg/dL — ABNORMAL HIGH (ref 65–99)
Glucose-Capillary: 106 mg/dL — ABNORMAL HIGH (ref 65–99)

## 2016-11-09 SURGERY — LOWER EXTREMITY INTERVENTION
Anesthesia: Moderate Sedation | Laterality: Left

## 2016-11-09 MED ORDER — HEPARIN (PORCINE) IN NACL 2-0.9 UNIT/ML-% IJ SOLN
INTRAMUSCULAR | Status: AC
  Filled 2016-11-09: qty 1000

## 2016-11-09 MED ORDER — MIDAZOLAM HCL 2 MG/2ML IJ SOLN
INTRAMUSCULAR | Status: DC | PRN
  Administered 2016-11-09: 2 mg via INTRAVENOUS

## 2016-11-09 MED ORDER — SODIUM CHLORIDE 0.9 % IV SOLN
Freq: Once | INTRAVENOUS | Status: AC
  Administered 2016-11-09: 11:00:00 via INTRAVENOUS

## 2016-11-09 MED ORDER — LIDOCAINE-EPINEPHRINE (PF) 2 %-1:200000 IJ SOLN
INTRAMUSCULAR | Status: AC
  Filled 2016-11-09: qty 20

## 2016-11-09 MED ORDER — HEPARIN SODIUM (PORCINE) 1000 UNIT/ML IJ SOLN
INTRAMUSCULAR | Status: DC | PRN
  Administered 2016-11-09: 2500 [IU] via INTRAVENOUS

## 2016-11-09 MED ORDER — FENTANYL CITRATE (PF) 100 MCG/2ML IJ SOLN
INTRAMUSCULAR | Status: AC
  Filled 2016-11-09: qty 2

## 2016-11-09 MED ORDER — HEPARIN SODIUM (PORCINE) 1000 UNIT/ML IJ SOLN
INTRAMUSCULAR | Status: AC
  Filled 2016-11-09: qty 1

## 2016-11-09 MED ORDER — ONDANSETRON HCL 4 MG/2ML IJ SOLN: 4.0000 mg | Freq: Once | INTRAMUSCULAR | Status: DC

## 2016-11-09 MED ORDER — CEFAZOLIN SODIUM-DEXTROSE 2-4 GM/100ML-% IV SOLN
INTRAVENOUS | Status: AC
  Filled 2016-11-09: qty 100

## 2016-11-09 MED ORDER — SODIUM CHLORIDE 0.9 % IV SOLN
INTRAVENOUS | Status: DC
  Administered 2016-11-09: 11:00:00 via INTRAVENOUS

## 2016-11-09 MED ORDER — IOPAMIDOL (ISOVUE-300) INJECTION 61%
INTRAVENOUS | Status: DC | PRN
  Administered 2016-11-09: 20 mL via INTRA_ARTERIAL

## 2016-11-09 MED ORDER — MIDAZOLAM HCL 5 MG/5ML IJ SOLN
INTRAMUSCULAR | Status: AC
  Filled 2016-11-09: qty 5

## 2016-11-09 MED ORDER — FENTANYL CITRATE (PF) 100 MCG/2ML IJ SOLN
INTRAMUSCULAR | Status: DC | PRN
  Administered 2016-11-09: 50 ug via INTRAVENOUS

## 2016-11-09 SURGICAL SUPPLY — 16 items
BLOCK BEAD 700-900 (Vascular Products) ×3 IMPLANT
CATH CXI SUPP ANG 2.6FR 150CM (MICROCATHETER) ×3 IMPLANT
CATH IMAGER II S 5FR 65CM (MISCELLANEOUS) ×6 IMPLANT
CATH MICROCATH PRGRT 2.8F 130 (MICROCATHETER) ×1 IMPLANT
COIL 400 COMPLEX SOFT 2X1CM (Vascular Products) ×3 IMPLANT
DEVICE STARCLOSE SE CLOSURE (Vascular Products) ×3 IMPLANT
GUIDEWIRE PFTE-COATED .018X300 (WIRE) ×3 IMPLANT
HANDLE DETACHMENT COIL (MISCELLANEOUS) ×3 IMPLANT
MICROCATH PROGREAT 2.8F 130CM (MICROCATHETER) ×3
PACK ANGIOGRAPHY (CUSTOM PROCEDURE TRAY) ×3 IMPLANT
SHEATH BRITE TIP 5FRX11 (SHEATH) ×3 IMPLANT
STOPCOCK ROT 3 WAY (MISCELLANEOUS) ×2
STOPCOCK ROT 3WAY (MISCELLANEOUS) ×1 IMPLANT
SYR MEDRAD MARK V 150ML (SYRINGE) ×3 IMPLANT
TUBING CONTRAST HIGH PRESS 72 (TUBING) ×3 IMPLANT
WIRE J 3MM .035X145CM (WIRE) ×6 IMPLANT

## 2016-11-09 NOTE — H&P (Signed)
[facility] History & Physical Update  The patient was interviewed and re-examined.  The patient's previous History and Physical has been reviewed and is unchanged.  There is no change in the plan of care. We plan to proceed with the scheduled procedure.  Leotis Pain, MD  11/09/2016, 9:41 AM

## 2016-11-09 NOTE — OR Nursing (Signed)
Discussed renal insufficiency, pre and post hydration and intraprocedural and post procedural immobilty,  with Daughter and pt.

## 2016-11-09 NOTE — Op Note (Signed)
[facility] Percutaneous Study/Intervention Procedural Note   Date of Surgery: 11/09/2016  Surgeon(s):Jakyren Fluegge   Assistants:none  Pre-operative Diagnosis: symptomatic cavernous hemangioma of the left Achilles region  Post-operative diagnosis: Same  Procedure(s) Performed: 1. Ultrasound guidance for vascular access right femoral artery 2. Catheter placement into left peroneal artery and left posterior tibial artery from right femoral approach 3. Aortogram and selective left lower extremity angiogram 4. Micro-bead embolization with approximately 1 cc of 300-500  polyvinyl alcohol beads to a left posterior tibial artery branch feeding the cavernous hemangioma 5. Coil embolization of the left posterior tibial artery branch feeding the cavernous hemangioma with a 2 mm x 1 cm Ruby coil  6.  StarClose closure device right femoral artery  EBL: 5 cc  Contrast: 20 cc  Fluoro Time: 8.3 minutes  Moderate Conscious Sedation Time: approximately 40 minutes using 2 mg of Versed and 50 mcg of Fentanyl  Indications: Patient is a 76 y.o.female with asymptomatic cavernous hemangioma in the left Achilles region. She originally was going to have surgery but had medical comorbidities that precluded it and she was referred to me for evaluation of embolization for treatment of this cavernous hemangioma. It was explained that surgical resection may still be required after embolization, at the hope would be to shrink the hemangioma and if it was no longer symptomatic potentially avoid resection. The patient is brought in for angiography for further evaluation and potential treatment. Risks and benefits are discussed and informed consent is obtained  Procedure: The patient was identified and appropriate procedural time out was performed. The patient was then placed supine on the table and prepped and  draped in the usual sterile fashion.Moderate conscious sedation was administered during a face to face encounter with the patient throughout the procedure with my supervision of the RN administering medicines and monitoring the patient's vital signs, pulse oximetry, telemetry and mental status throughout from the start of the procedure until the patient was taken to the recovery room. Ultrasound was used to evaluate the right common femoral artery. It was patent . A digital ultrasound image was acquired. A Seldinger needle was used to access the right common femoral artery under direct ultrasound guidance and a permanent image was performed. A 0.035 J wire was advanced without resistance and a 5Fr sheath was placed.no aortogram was necessary as this was not being done for occlusive disease. I then crossed the aortic bifurcation and advanced to the left femoral head. Selective left lower extremity angiogram was then performed. This demonstrated normal common femoral artery, profunda femoris artery, superficial femoral artery, and popliteal artery. There is normal tibial trifurcation. The anterior tibial artery was patent with good runoff into the foot. The peroneal artery was continuous to the mid to lower calf where it occluded.The posterior tibial artery was patent with good runoff into the foot and appeared to be the origin for the vessels feeding the cavernous hemangioma. Limited contrast was used due to her chronic kidney disease. A CXI catheter was advanced down into the peroneal artery and selective imaging was performed. This showed occlusion of the peroneal artery in the mid to distal segment with no significant flow into the cavernous hemangioma through the peroneal artery. Initial imaging showed no obvious connection between the anterior tibial artery in the cavernous hemangioma and the location would make this unlikely, so selective imaging of the anterior tibial artery was not performed. Selective  imaging of the posterior tibial artery with small aliquots of contrast demonstrated this was  the feeding vessel of the cavernous hemangioma in the Achilles region. A 0.018 advantage wire was placed and I then exchanged for the pro-great microcatheter. At the level of the ankle, there were 2 blood vessels that appeared to be continuous into the foot and where the dominant runoff to the foot. At this level, there was a posterior and superior branch of the posterior tibial artery that appeared to be the primary feeding point of the cavernous hemangioma although some collateral filling was also seen as well which was not surprising. We made this branch our target. We were able to cannulate this branch with the pro-great microcatheter surprisingly easy. Selective imaging showed brisk filling into the cavernous hemangioma with minimal branches that would go down into the foot or ankle. 2 injections of 0.5 cc of 300-500  polyvinyl alcohol beads were performed through the pro-great microcatheter. Imaging followed this showed much less brisk flow but still some flow into the cavernous hemangioma. I elected to place a small cord on this branch as it would not impair the blood flow to the foot and may limit the inflow into the cavernous hemangioma. A single 2 mm x 1 cm Roux be coil was then placed in this left posterior tibial artery branch feeding the cavernous hemangioma. Following this, with the catheter in the origin of this branch as well as within the posterior tibial artery itself, essentially no blood flow was seen into the cavernous hemangioma with occlusion of this branch. I felt we had markedly decreased perfusion into this cavernous hemangioma and there was little we could do beyond this from an endovascular approach. I elected to terminate the procedure. The sheath was removed and StarClose closure device was deployed in the right femoral artery with excellent hemostatic result. The patient was taken to the  recovery room in stable condition having tolerated the procedure well.  Findings:   Left Lower Extremity: normal common femoral artery, profunda femoris artery, superficial femoral artery, and popliteal artery. There is normal tibial trifurcation. The anterior tibial artery was patent with good runoff into the foot. The peroneal artery was continuous to the mid to lower calf where it occluded.The posterior tibial artery was patent with good runoff into the foot and appeared to be the origin for the vessels feeding the cavernous hemangioma.   Disposition: Patient was taken to the recovery room in stable condition having tolerated the procedure well.  Complications: None  Leotis Pain 11/09/2016 12:36 PM   This note was created with Dragon Medical transcription system. Any errors in dictation are purely unintentional.

## 2016-11-09 NOTE — OR Nursing (Signed)
Pt and family notified of delay in cases. Pt requested FSBS to be drawn.

## 2016-11-09 NOTE — OR Nursing (Signed)
Pt dressed and discharged to home. Right femoral site without bleeding or hematoma. Vitals signs remained stable. Pt discharged to home with daughter.

## 2016-11-09 NOTE — Discharge Instructions (Signed)
Moderate Conscious Sedation, Adult, Care After These instructions provide you with information about caring for yourself after your procedure. Your health care provider may also give you more specific instructions. Your treatment has been planned according to current medical practices, but problems sometimes occur. Call your health care provider if you have any problems or questions after your procedure. What can I expect after the procedure? After your procedure, it is common:  To feel sleepy for several hours.  To feel clumsy and have poor balance for several hours.  To have poor judgment for several hours.  To vomit if you eat too soon.  Follow these instructions at home: For at least 24 hours after the procedure:   Do not: ? Participate in activities where you could fall or become injured. ? Drive. ? Use heavy machinery. ? Drink alcohol. ? Take sleeping pills or medicines that cause drowsiness. ? Make important decisions or sign legal documents. ? Take care of children on your own.  Rest. Eating and drinking  Follow the diet recommended by your health care provider.  If you vomit: ? Drink water, juice, or soup when you can drink without vomiting. ? Make sure you have little or no nausea before eating solid foods. General instructions  Have a responsible adult stay with you until you are awake and alert.  Take over-the-counter and prescription medicines only as told by your health care provider.  If you smoke, do not smoke without supervision.  Keep all follow-up visits as told by your health care provider. This is important. Contact a health care provider if:  You keep feeling nauseous or you keep vomiting.  You feel light-headed.  You develop a rash.  You have a fever. Get help right away if:  You have trouble breathing. This information is not intended to replace advice given to you by your health care provider. Make sure you discuss any questions you have  with your health care provider. Document Released: 01/01/2013 Document Revised: 08/16/2015 Document Reviewed: 07/03/2015 Elsevier Interactive Patient Education  2018 [employer] This sheet gives you information about how to care for yourself after your procedure. Your doctor may also give you more specific instructions. If you have problems or questions, contact your doctor. Follow these instructions at home: Insertion site care  Follow instructions from your doctor about how to take care of your long, thin tube (catheter) insertion area. Make sure you: ? Wash your hands with soap and water before you change your bandage (dressing). If you cannot use soap and water, use hand sanitizer. ? Change your bandage as told by your doctor. ? Leave stitches (sutures), skin glue, or skin tape (adhesive) strips in place. They may need to stay in place for 2 weeks or longer. If tape strips get loose and curl up, you may trim the loose edges. Do not remove tape strips completely unless your doctor says it is okay.  Do not take baths, swim, or use a hot tub until your doctor says it is okay.  You may shower 24-48 hours after the procedure or as told by your doctor. ? Gently wash the area with plain soap and water. ? Pat the area dry with a clean towel. ? Do not rub the area. This may cause bleeding.  Do not apply powder or lotion to the area. Keep the area clean and dry.  Check your insertion area every day for signs of infection. Check for: ? More redness, swelling, or pain. ?  Fluid or blood. ? Warmth. ? Pus or a bad smell. Activity  Rest as told by your doctor, usually for 1-2 days.  Do not lift anything that is heavier than 10 lbs. (4.5 kg) or as told by your doctor.  Do not drive for 24 hours if you were given a medicine to help you relax (sedative).  Do not drive or use heavy machinery while taking prescription pain medicine. General instructions  Go back to your  normal activities as told by your doctor, usually in about a week. Ask your doctor what activities are safe for you.  If the insertion area starts to bleed, lie flat and put pressure on the area. If the bleeding does not stop, get help right away. This is an emergency.  Drink enough fluid to keep your pee (urine) clear or pale yellow.  Take over-the-counter and prescription medicines only as told by your doctor.  Keep all follow-up visits as told by your doctor. This is important. Contact a doctor if:  You have a fever.  You have chills.  You have more redness, swelling, or pain around your insertion area.  You have fluid or blood coming from your insertion area.  The insertion area feels warm to the touch.  You have pus or a bad smell coming from your insertion area.  You have more bruising around the insertion area.  Blood collects in the tissue around the insertion area (hematoma) that may be painful to the touch. Get help right away if:  You have a lot of pain in the insertion area.  The insertion area swells very fast.  The insertion area is bleeding, and the bleeding does not stop after holding steady pressure on the area.  The area near or just beyond the insertion area becomes pale, cool, tingly, or numb. These symptoms may be an emergency. Do not wait to see if the symptoms will go away. Get medical help right away. Call your local emergency services (911 in the [place] Do not drive yourself to the hospital. Summary  After the procedure, it is common to have bruising and tenderness at the long, thin tube insertion area.  After the procedure, it is important to rest and drink plenty of fluids.  Do not take baths, swim, or use a hot tub until your doctor says it is okay to do so. You may shower 24-48 hours after the procedure or as told by your doctor.  If the insertion area starts to bleed, lie flat and put pressure on the area. If the bleeding does not stop, get  help right away. This is an emergency. This information is not intended to replace advice given to you by your health care provider. Make sure you discuss any questions you have with your health care provider. Document Released: 06/09/2008 Document Revised: 03/07/2016 Document Reviewed: 03/07/2016 Elsevier Interactive Patient Education  2017 [employer].

## 2016-11-10 ENCOUNTER — Encounter: Payer: Self-pay | Admitting: Vascular Surgery

## 2016-11-13 ENCOUNTER — Ambulatory Visit: Payer: Medicare HMO

## 2016-11-13 ENCOUNTER — Encounter: Payer: Self-pay | Admitting: Vascular Surgery

## 2016-11-14 ENCOUNTER — Ambulatory Visit
Admission: RE | Admit: 2016-11-14 | Discharge: 2016-11-14 | Disposition: A | Discharge status: Home / Self Care | Payer: Medicare HMO | Source: Ambulatory Visit | Attending: General Surgery | Admitting: General Surgery

## 2016-11-14 DIAGNOSIS — C50211 Malignant neoplasm of upper-inner quadrant of right female breast: Secondary | ICD-10-CM | POA: Insufficient documentation

## 2016-11-14 DIAGNOSIS — Z171 Estrogen receptor negative status [ER-]: Secondary | ICD-10-CM | POA: Insufficient documentation

## 2016-11-14 DIAGNOSIS — R928 Other abnormal and inconclusive findings on diagnostic imaging of breast: Secondary | ICD-10-CM | POA: Diagnosis not present

## 2016-11-16 ENCOUNTER — Other Ambulatory Visit: Payer: Medicare HMO

## 2016-11-17 ENCOUNTER — Ambulatory Visit: Admission: RE | Admit: 2016-11-17 | Payer: Medicare HMO | Source: Ambulatory Visit | Admitting: Podiatry

## 2016-11-17 ENCOUNTER — Encounter: Admission: RE | Payer: Self-pay | Source: Ambulatory Visit

## 2016-11-17 SURGERY — EXCISION LIPOMA
Anesthesia: Choice | Laterality: Left

## 2016-11-22 ENCOUNTER — Encounter: Payer: Self-pay | Admitting: *Deleted

## 2016-11-23 ENCOUNTER — Encounter: Payer: Self-pay | Admitting: General Surgery

## 2016-11-23 ENCOUNTER — Ambulatory Visit (INDEPENDENT_AMBULATORY_CARE_PROVIDER_SITE_OTHER): Payer: Medicare HMO | Admitting: General Surgery

## 2016-11-23 VITALS — BP 142/70 | HR 74 | Resp 18 | Ht 62.0 in | Wt 242.0 lb

## 2016-11-23 DIAGNOSIS — C50211 Malignant neoplasm of upper-inner quadrant of right female breast: Secondary | ICD-10-CM | POA: Diagnosis not present

## 2016-11-23 DIAGNOSIS — Z171 Estrogen receptor negative status [ER-]: Secondary | ICD-10-CM

## 2016-11-23 NOTE — Patient Instructions (Signed)
The patient has been asked to return to the office in one year with a bilateral diagnostic mammogram with DR. Byrnett.

## 2016-11-23 NOTE — Progress Notes (Signed)
Patient ID: Alexis Greer, female   DOB: 10-22-1940, 76 y.o.   MRN: [id number]  Chief Complaint  Patient presents with  . Follow-up    HPI Alexis Greer is a 76 y.o. female who presents for a breast cancer follow up. The most recent mammogram was done on 11/14/2016.  Patient does perform regular self breast checks and gets regular mammograms done.  No breast symptoms. 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  HPI  Past Medical History:  Diagnosis Date  . Arthritis   . Asthma   . Breast cancer ([place]) 11/17/14   right breast lumpectomy, triple negative, with mammosite tx. Chemo with held due to CVA after lumpectomy  . Chronic kidney disease   . Complication of anesthesia    had a stroke during the breast lumpectomy  . Diabetes mellitus without complication ([place])   . Dyspnea   . Hyperlipidemia   . Hypertension   . Stroke [facility]) 2016   post lumpectomy    Past Surgical History:  Procedure Laterality Date  . ABDOMINAL HYSTERECTOMY  1979  . BREAST BIOPSY Right 11/17/2014   Invasive mammory carcinoma  . BREAST BIOPSY Right 12/01/2014   Procedure: BREAST BIOPSY WITH NEEDLE LOCALIZATION;  Surgeon: Christene Lye, MD;  Location: [facility] ORS;  Service: General;  Laterality: Right;  . BREAST LUMPECTOMY Right 2016  . BREAST LUMPECTOMY WITH SENTINEL LYMPH NODE BIOPSY Right 12/01/2014   Procedure: BREAST LUMPECTOMY WITH SENTINEL LYMPH NODE BX;  Surgeon: Christene Lye, MD;  Location: [facility] ORS;  Service: General;  Laterality: Right;  . COLONOSCOPY WITH PROPOFOL N/A 06/13/2016   Procedure: COLONOSCOPY WITH PROPOFOL;  Surgeon: Lollie Sails, MD;  Location: [facility] ENDOSCOPY;  Service: Endoscopy;  Laterality: N/A;  . LOWER EXTREMITY INTERVENTION Left 11/09/2016   Procedure: LOWER EXTREMITY INTERVENTION;  Surgeon: Algernon Huxley, MD;  Location: [facility] CV LAB;  Service: Cardiovascular;  Laterality: Left;  . OOPHORECTOMY  2014  . TONSILLECTOMY       Family History  Problem Relation Age of Onset  . Heart disease Mother   . Lung cancer Sister        smoker  . CAD Father   . Hyperlipidemia Father     Social History Social History  Substance Use Topics  . Smoking status: Former Smoker    Quit date: 08/25/1993  . Smokeless tobacco: Never Used  . Alcohol use No    Allergies  Allergen Reactions  . Oxycodone Other (See Comments)    It made the patient have shakes and jittery     Current Outpatient Prescriptions  Medication Sig Dispense Refill  . ACCU-CHEK AVIVA PLUS test strip     . acetaminophen (TYLENOL) 500 MG tablet Take 1,000 mg by mouth every 6 (six) hours as needed for mild pain or headache.    . allopurinol (ZYLOPRIM) 100 MG tablet Take 100 mg by mouth 2 (two) times daily.     Marland Kitchen amLODipine (NORVASC) 10 MG tablet Take 10 mg by mouth at bedtime.     Marland Kitchen aspirin EC 81 MG tablet Take 81 mg by mouth daily.    Marland Kitchen atorvastatin (LIPITOR) 40 MG tablet Take 40 mg by mouth daily at 6 PM.    . atorvastatin (LIPITOR) 80 MG tablet Take 1 tablet (80 mg total) by mouth daily. 30 tablet 0  . carvedilol (COREG) 25 MG tablet Take 25 mg by mouth daily.    . Cholecalciferol (D-5000) 5000 UNITS TABS Take  5,000 Units by mouth daily.     . ferrous sulfate 325 (65 FE) MG tablet Take 325 mg by mouth daily.    Marland Kitchen glimepiride (AMARYL) 4 MG tablet Take 4 mg by mouth daily.     . insulin glargine (LANTUS) 100 UNIT/ML injection Inject 51 Units into the skin daily after breakfast.     . Menthol-Methyl Salicylate (MUSCLE RUB) 10-15 % CREA Apply 1 application topically as needed for muscle pain.    . Multiple Vitamin (MULTI-VITAMINS) TABS Take 1 tablet by mouth daily.     Marland Kitchen omeprazole (PRILOSEC) 20 MG capsule Take 40 mg by mouth daily. Takes 2 tablets daily    . vitamin B-12 (CYANOCOBALAMIN) 1000 MCG tablet Take 1,000 mcg by mouth daily.    . vitamin E 400 UNIT capsule Take 400 Units by mouth daily.     . chlorthalidone (HYGROTON) 25 MG tablet Take 25  mg by mouth daily.      Current Facility-Administered Medications  Medication Dose Route Frequency Provider Last Rate Last Dose  . ondansetron (ZOFRAN) injection 4 mg  4 mg Intravenous Once Dew, Erskine Squibb, MD        Review of Systems Review of Systems  Constitutional: Negative.   Respiratory: Negative.   Cardiovascular: Negative.     Blood pressure (!) 142/70, pulse 74, resp. rate 18, height 5\' 2"  (1.575 m), weight 242 lb (109.8 kg).  Physical Exam Physical Exam  Constitutional: She is oriented to person, place, and time. She appears well-developed and well-nourished.  Eyes: Conjunctivae are normal. No scleral icterus.  Neck: Neck supple.  Cardiovascular: Normal rate, regular rhythm and normal heart sounds.   Pulmonary/Chest: Effort normal and breath sounds normal. Right breast exhibits no inverted nipple, no mass, no nipple discharge, no skin change and no tenderness. Left breast exhibits no inverted nipple, no mass, no nipple discharge, no skin change and no tenderness.    Right breast excision site is clean and well healed.   Abdominal: Soft. Bowel sounds are normal. There is no tenderness.  Lymphadenopathy:    She has no cervical adenopathy.    She has no axillary adenopathy.  Neurological: She is alert and oriented to person, place, and time.  Skin: Skin is warm and dry.  Vitals reviewed.   Data Reviewed Mammogram reviewed -minimal scarring at lumpectomy site  Assessment      2 year post right breast lumpectomy & SLN Bx, followed by mammosite radiation-triple neg CA. Pt had a stroke after lumpectomy and chemo was decided against. Exam is stable.  Plan    CA 27-29 order.    The patient has been asked to return to the office in one year with a bilateral diagnostic mammogram with DR. Byrnett.  HPI, Physical Exam, Assessment and Plan have been scribed under the direction and in the presence of Mckinley Jewel, MD  Gaspar Cola, CMA I have completed the exam and  reviewed the above documentation for accuracy and completeness.  I agree with the above.  Haematologist has been used and any errors in dictation or transcription are unintentional.  Melbert Botelho G. Jamal Collin, M.D., F.A.C.S.    Junie Panning G 11/23/2016, 9:11 AM

## 2016-11-24 ENCOUNTER — Telehealth: Payer: Self-pay

## 2016-11-24 LAB — CANCER ANTIGEN 27.29: CA 27.29: 25.9 U/mL (ref 0.0–38.6)

## 2016-11-24 NOTE — Telephone Encounter (Signed)
-----   Message from Christene Lye, MD sent at 11/24/2016  8:41 AM EDT ----- Caryl-lyn please notify pt- normal value

## 2016-11-24 NOTE — Telephone Encounter (Signed)
Notified patient as instructed, patient pleased. Discussed follow-up appointments, patient agrees  

## 2016-11-28 ENCOUNTER — Ambulatory Visit (INDEPENDENT_AMBULATORY_CARE_PROVIDER_SITE_OTHER): Payer: Medicare HMO | Admitting: Vascular Surgery

## 2016-11-28 ENCOUNTER — Encounter (INDEPENDENT_AMBULATORY_CARE_PROVIDER_SITE_OTHER): Payer: Self-pay | Admitting: Vascular Surgery

## 2016-11-28 VITALS — BP 132/72 | HR 78 | Resp 17 | Ht 64.0 in | Wt 240.0 lb

## 2016-11-28 DIAGNOSIS — I10 Essential (primary) hypertension: Secondary | ICD-10-CM | POA: Diagnosis not present

## 2016-11-28 DIAGNOSIS — D1801 Hemangioma of skin and subcutaneous tissue: Secondary | ICD-10-CM

## 2016-11-28 DIAGNOSIS — E119 Type 2 diabetes mellitus without complications: Secondary | ICD-10-CM | POA: Diagnosis not present

## 2016-11-28 DIAGNOSIS — D1800 Hemangioma unspecified site: Secondary | ICD-10-CM

## 2016-11-28 NOTE — Progress Notes (Signed)
MRN : [id number]  Alexis Greer is a 76 y.o. (Sep 30, 1940) female who presents with chief complaint of  Chief Complaint  Patient presents with  . Follow-up    2 week post-op  .  History of Present Illness: Patient returns today in follow up of left Achilles area hemangioma which we performed embolization on about 2 weeks ago. She had no periprocedural complications and feels well today. She says this site has gone down a little although not dramatically so. Her access site is well-healed. No pain or problems after the procedure.  Current Outpatient Prescriptions  Medication Sig Dispense Refill  . ACCU-CHEK AVIVA PLUS test strip     . acetaminophen (TYLENOL) 500 MG tablet Take 1,000 mg by mouth every 6 (six) hours as needed for mild pain or headache.    . allopurinol (ZYLOPRIM) 100 MG tablet Take 100 mg by mouth 2 (two) times daily.     Marland Kitchen amLODipine (NORVASC) 10 MG tablet Take 10 mg by mouth at bedtime.     Marland Kitchen aspirin EC 81 MG tablet Take 81 mg by mouth daily.    Marland Kitchen atorvastatin (LIPITOR) 40 MG tablet Take 40 mg by mouth daily at 6 PM.    . atorvastatin (LIPITOR) 80 MG tablet Take 1 tablet (80 mg total) by mouth daily. 30 tablet 0  . carvedilol (COREG) 25 MG tablet Take 25 mg by mouth daily.    . chlorthalidone (HYGROTON) 25 MG tablet Take 25 mg by mouth daily.     . Cholecalciferol (D-5000) 5000 UNITS TABS Take 5,000 Units by mouth daily.     . ferrous sulfate 325 (65 FE) MG tablet Take 325 mg by mouth daily.    Marland Kitchen glimepiride (AMARYL) 4 MG tablet Take 4 mg by mouth daily.     . insulin glargine (LANTUS) 100 UNIT/ML injection Inject 51 Units into the skin daily after breakfast.     . Menthol-Methyl Salicylate (MUSCLE RUB) 10-15 % CREA Apply 1 application topically as needed for muscle pain.    . Multiple Vitamin (MULTI-VITAMINS) TABS Take 1 tablet by mouth daily.     Marland Kitchen omeprazole (PRILOSEC) 20 MG capsule Take 40 mg by mouth daily. Takes 2 tablets daily    . vitamin B-12  (CYANOCOBALAMIN) 1000 MCG tablet Take 1,000 mcg by mouth daily.    . vitamin E 400 UNIT capsule Take 400 Units by mouth daily.      Current Facility-Administered Medications  Medication Dose Route Frequency Provider Last Rate Last Dose  . ondansetron (ZOFRAN) injection 4 mg  4 mg Intravenous Once Algernon Huxley, MD        Past Medical History:  Diagnosis Date  . Arthritis   . Asthma   . Breast cancer ([place]) 11/17/14   right breast lumpectomy, triple negative, with mammosite tx. Chemo with held due to CVA after lumpectomy  . Chronic kidney disease   . Complication of anesthesia    had a stroke during the breast lumpectomy  . Diabetes mellitus without complication ([place])   . Dyspnea   . Hyperlipidemia   . Hypertension   . Stroke [facility]) 2016   post lumpectomy    Past Surgical History:  Procedure Laterality Date  . ABDOMINAL HYSTERECTOMY  1979  . BREAST BIOPSY Right 11/17/2014   Invasive mammory carcinoma  . BREAST BIOPSY Right 12/01/2014   Procedure: BREAST BIOPSY WITH NEEDLE LOCALIZATION;  Surgeon: Christene Lye, MD;  Location: [facility] ORS;  Service: General;  Laterality: Right;  .  BREAST LUMPECTOMY Right 2016  . BREAST LUMPECTOMY WITH SENTINEL LYMPH NODE BIOPSY Right 12/01/2014   Procedure: BREAST LUMPECTOMY WITH SENTINEL LYMPH NODE BX;  Surgeon: Christene Lye, MD;  Location: [facility] ORS;  Service: General;  Laterality: Right;  . COLONOSCOPY WITH PROPOFOL N/A 06/13/2016   Procedure: COLONOSCOPY WITH PROPOFOL;  Surgeon: Lollie Sails, MD;  Location: [facility] ENDOSCOPY;  Service: Endoscopy;  Laterality: N/A;  . LOWER EXTREMITY INTERVENTION Left 11/09/2016   Procedure: LOWER EXTREMITY INTERVENTION;  Surgeon: Algernon Huxley, MD;  Location: [place] CV LAB;  Service: Cardiovascular;  Laterality: Left;  . OOPHORECTOMY  2014  . TONSILLECTOMY       Social History      Social History  Substance Use Topics  . Smoking status: Former Smoker    Quit date: 08/25/1993  . Smokeless  tobacco: Never Used  . Alcohol use No     Family History      Family History  Problem Relation Age of Onset  . Heart disease Mother   . Lung cancer Sister        smoker  . CAD Father   . Hyperlipidemia Father          Allergies  Allergen Reactions  . Oxycodone Other (See Comments)    It made the patient have shakes and jittery      REVIEW OF SYSTEMS (Negative unless checked)  Constitutional: [] Weight loss  [] Fever  [] Chills Cardiac: [] Chest pain   [] Chest pressure   [] Palpitations   [] Shortness of breath when laying flat   [] Shortness of breath at rest   [] Shortness of breath with exertion. Vascular:  [] Pain in legs with walking   [] Pain in legs at rest   [] Pain in legs when laying flat   [] Claudication   [] Pain in feet when walking  [] Pain in feet at rest  [] Pain in feet when laying flat   [] History of DVT   [] Phlebitis   [x] Swelling in legs   [] Varicose veins   [] Non-healing ulcers Pulmonary:   [] Uses home oxygen   [] Productive cough   [] Hemoptysis   [] Wheeze  [] COPD   [] Asthma Neurologic:  [] Dizziness  [] Blackouts   [] Seizures   [x] History of stroke   [] History of TIA  [] Aphasia   [] Temporary blindness   [] Dysphagia   [] Weakness or numbness in arms   [] Weakness or numbness in legs Musculoskeletal:  [x] Arthritis   [] Joint swelling   [x] Joint pain   [] Low back pain Hematologic:  [] Easy bruising  [] Easy bleeding   [] Hypercoagulable state   [] Anemic  [] Hepatitis Gastrointestinal:  [] Blood in stool   [] Vomiting blood  [] Gastroesophageal reflux/heartburn   [] Difficulty swallowing. Genitourinary:  [] Chronic kidney disease   [] Difficult urination  [] Frequent urination  [] Burning with urination   [] Blood in urine Skin:  [] Rashes   [] Ulcers   [] Wounds Psychological:  [] History of anxiety   []  History of major depression.   Physical Examination  BP 132/72 (BP Location: Right Arm)   Pulse 78   Resp 17   Ht 5\' 4"  (1.626 m)   Wt 108.9 kg (240 lb)   BMI 41.20 kg/m    Gen:  WD/WN, NAD Head: [place]/AT, No temporalis wasting. Ear/Nose/Throat: Hearing grossly intact, nares w/o erythema or drainage, trachea midline Eyes: Conjunctiva clear. Sclera non-icteric Neck: Supple.  No JVD.  Pulmonary:  Good air movement, no use of accessory muscles.  Cardiac: RRR, normal S1, S2 Vascular:  Vessel Right Left  Radial Palpable Palpable  PT 1+ Palpable Not Palpable  DP Palpable Palpable    Musculoskeletal: M/S 5/5 throughout.  Golf ball sized area on the posterior Achilles just at the level of the ankle which is stable to slightly improved from her preprocedure size. 1+ bilateral lower extremity edema. Neurologic: Sensation grossly intact in extremities.  Symmetrical.  Speech is fluent.  Psychiatric: Judgment intact, Mood & affect appropriate for pt's clinical situation. Dermatologic: No rashes or ulcers noted.  No cellulitis or open wounds.       Labs Recent Results (from the past 2160 hour(s))  Glucose, capillary     Status: Abnormal   Collection Time: 11/09/16  9:48 AM  Result Value Ref Range   Glucose-Capillary 110 (H) 65 - 99 mg/dL  Glucose, capillary     Status: Abnormal   Collection Time: 11/09/16  1:38 PM  Result Value Ref Range   Glucose-Capillary 106 (H) 65 - 99 mg/dL  Cancer antigen 27.29     Status: None   Collection Time: 11/23/16  9:15 AM  Result Value Ref Range   CA 27.29 25.9 0.0 - 38.6 U/mL    Comment: Research scientist (life sciences)    Radiology Mm Diag Breast Tomo Bilateral  Result Date: 11/14/2016 CLINICAL DATA:  76 year old female presenting for routine annual evaluation status post right breast lumpectomy in 2016. EXAM: 2D DIGITAL DIAGNOSTIC BILATERAL MAMMOGRAM WITH CAD AND ADJUNCT TOMO COMPARISON:  Previous exam(s). ACR Breast Density Category a: The breast tissue is almost entirely fatty. FINDINGS: The right breast lumpectomy site is stable. No suspicious calcifications, masses or areas of distortion are  seen in the bilateral breasts. Mammographic images were processed with CAD. IMPRESSION: Stable right breast lumpectomy site. No mammographic evidence of malignancy in the bilateral breasts. RECOMMENDATION: Diagnostic mammogram is suggested in 1 year. (Code:DM-B-01Y) I have discussed the findings and recommendations with the patient. Results were also provided in writing at the conclusion of the visit. If applicable, a reminder letter will be sent to the patient regarding the next appointment. BI-RADS CATEGORY  2: Benign. Electronically Signed   By: Ammie Ferrier M.D.   On: 11/14/2016 10:03      Assessment/Plan Hypertension blood pressure control important in reducing the progression of atherosclerotic disease. On appropriate oral medications.   Diabetes mellitus without complication (HCC) blood glucose control important in reducing the progression of atherosclerotic disease. Also, involved in wound healing. On appropriate medications.   Hyperlipidemia lipid control important in reducing the progression of atherosclerotic disease. Continue statin therapy  Cavernous hemangioma Left Achilles area. Slight improvement after embolization. Currently is not bothering her that much and she would like to continue to watch and see how much further shrink. I'll plan to see her back in about 3 months. I discussed that regression is variable and do not expect it to completely go away. She understands this. She will contact my office with any problems in the interim.    Leotis Pain, MD  11/28/2016 1:23 PM    This note was created with Dragon medical transcription system.  Any errors from dictation are purely unintentional

## 2016-11-28 NOTE — Assessment & Plan Note (Signed)
Left Achilles area. Slight improvement after embolization. Currently is not bothering her that much and she would like to continue to watch and see how much further shrink. I'll plan to see her back in about 3 months. I discussed that regression is variable and do not expect it to completely go away. She understands this. She will contact my office with any problems in the interim.

## 2016-12-05 DIAGNOSIS — I10 Essential (primary) hypertension: Secondary | ICD-10-CM | POA: Diagnosis not present

## 2016-12-05 DIAGNOSIS — H35033 Hypertensive retinopathy, bilateral: Secondary | ICD-10-CM | POA: Diagnosis not present

## 2016-12-05 DIAGNOSIS — E1165 Type 2 diabetes mellitus with hyperglycemia: Secondary | ICD-10-CM | POA: Diagnosis not present

## 2016-12-05 DIAGNOSIS — H40013 Open angle with borderline findings, low risk, bilateral: Secondary | ICD-10-CM | POA: Diagnosis not present

## 2016-12-18 DIAGNOSIS — H25811 Combined forms of age-related cataract, right eye: Secondary | ICD-10-CM | POA: Diagnosis not present

## 2016-12-18 DIAGNOSIS — H25812 Combined forms of age-related cataract, left eye: Secondary | ICD-10-CM | POA: Diagnosis not present

## 2016-12-18 DIAGNOSIS — E119 Type 2 diabetes mellitus without complications: Secondary | ICD-10-CM | POA: Diagnosis not present

## 2016-12-18 DIAGNOSIS — H25813 Combined forms of age-related cataract, bilateral: Secondary | ICD-10-CM | POA: Diagnosis not present

## 2017-01-04 DIAGNOSIS — H25812 Combined forms of age-related cataract, left eye: Secondary | ICD-10-CM | POA: Diagnosis not present

## 2017-01-04 DIAGNOSIS — H2512 Age-related nuclear cataract, left eye: Secondary | ICD-10-CM | POA: Diagnosis not present

## 2017-01-17 DIAGNOSIS — H25811 Combined forms of age-related cataract, right eye: Secondary | ICD-10-CM | POA: Diagnosis not present

## 2017-01-17 DIAGNOSIS — H25812 Combined forms of age-related cataract, left eye: Secondary | ICD-10-CM | POA: Diagnosis not present

## 2017-01-17 DIAGNOSIS — H2511 Age-related nuclear cataract, right eye: Secondary | ICD-10-CM | POA: Diagnosis not present

## 2017-01-24 DIAGNOSIS — H25811 Combined forms of age-related cataract, right eye: Secondary | ICD-10-CM | POA: Diagnosis not present

## 2017-02-21 DIAGNOSIS — H25811 Combined forms of age-related cataract, right eye: Secondary | ICD-10-CM | POA: Diagnosis not present

## 2017-02-26 DIAGNOSIS — E119 Type 2 diabetes mellitus without complications: Secondary | ICD-10-CM | POA: Diagnosis not present

## 2017-02-26 DIAGNOSIS — Z794 Long term (current) use of insulin: Secondary | ICD-10-CM | POA: Diagnosis not present

## 2017-02-26 DIAGNOSIS — I10 Essential (primary) hypertension: Secondary | ICD-10-CM | POA: Diagnosis not present

## 2017-03-02 ENCOUNTER — Ambulatory Visit (INDEPENDENT_AMBULATORY_CARE_PROVIDER_SITE_OTHER): Payer: Medicare HMO | Admitting: Vascular Surgery

## 2017-03-30 DIAGNOSIS — N184 Chronic kidney disease, stage 4 (severe): Secondary | ICD-10-CM | POA: Diagnosis not present

## 2017-03-30 DIAGNOSIS — N183 Chronic kidney disease, stage 3 (moderate): Secondary | ICD-10-CM | POA: Diagnosis not present

## 2017-03-30 DIAGNOSIS — D649 Anemia, unspecified: Secondary | ICD-10-CM | POA: Diagnosis not present

## 2017-03-30 DIAGNOSIS — E782 Mixed hyperlipidemia: Secondary | ICD-10-CM | POA: Diagnosis not present

## 2017-03-30 DIAGNOSIS — I10 Essential (primary) hypertension: Secondary | ICD-10-CM | POA: Diagnosis not present

## 2017-03-30 DIAGNOSIS — E119 Type 2 diabetes mellitus without complications: Secondary | ICD-10-CM | POA: Diagnosis not present

## 2017-03-30 DIAGNOSIS — Z794 Long term (current) use of insulin: Secondary | ICD-10-CM | POA: Diagnosis not present

## 2017-03-30 DIAGNOSIS — E1122 Type 2 diabetes mellitus with diabetic chronic kidney disease: Secondary | ICD-10-CM | POA: Diagnosis not present

## 2017-06-28 ENCOUNTER — Other Ambulatory Visit: Payer: Self-pay

## 2017-06-28 ENCOUNTER — Ambulatory Visit
Admission: RE | Admit: 2017-06-28 | Discharge: 2017-06-28 | Disposition: A | Discharge status: Home / Self Care | Payer: Medicare HMO | Source: Ambulatory Visit | Attending: Radiation Oncology | Admitting: Radiation Oncology

## 2017-06-28 VITALS — BP 136/68 | HR 59 | Temp 98.1°F | Resp 16 | Ht 62.0 in | Wt 242.7 lb

## 2017-06-28 DIAGNOSIS — Z171 Estrogen receptor negative status [ER-]: Principal | ICD-10-CM

## 2017-06-28 DIAGNOSIS — C50411 Malignant neoplasm of upper-outer quadrant of right female breast: Secondary | ICD-10-CM

## 2017-06-28 DIAGNOSIS — Z853 Personal history of malignant neoplasm of breast: Secondary | ICD-10-CM | POA: Diagnosis not present

## 2017-06-28 NOTE — Progress Notes (Signed)
Radiation Oncology Follow up Note  Name: Alexis Greer   Date:   06/28/2017 MRN:  [id number] DOB: 1940/07/02    This 77 y.o. female presents to the clinic today for 2.5 year follow-up status post accelerated partial breast radiation to her right breast for stage I invasive mammary carcinoma.  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 Seen today in routine follow-up she is doing well. She specifically denies breast tenderness cough or bone pain..she had mammograms back in August which I have reviewed BI-RADS 2 benign. She is not on antiestrogen therapy based on the triple negative nature of her disease.  COMPLICATIONS OF TREATMENT: none  FOLLOW UP COMPLIANCE: keeps appointments   PHYSICAL EXAM:  BP 136/68 (BP Location: Left Arm, Patient Position: Sitting, Cuff Size: Small)   Pulse (!) 59   Temp 98.1 F (36.7 C) (Tympanic)   Resp 16   Ht 5\' 2"  (1.575 m)   Wt 242 lb 11.6 oz (110.1 kg)   BMI 44.40 kg/m  Lungs are clear to A&P cardiac examination essentially unremarkable with regular rate and rhythm. No dominant mass or nodularity is noted in either breast in 2 positions examined. Incision is well-healed. No axillary or supraclavicular adenopathy is appreciated. Cosmetic result is excellent. Well-developed well-nourished patient in NAD. HEENT reveals PERLA, EOMI, discs not visualized.  Oral cavity is clear. No oral mucosal lesions are identified. Neck is clear without evidence of cervical or supraclavicular adenopathy. Lungs are clear to A&P. Cardiac examination is essentially unremarkable with regular rate and rhythm without murmur rub or thrill. Abdomen is benign with no organomegaly or masses noted. Motor sensory and DTR levels are equal and symmetric in the upper and lower  extremities. Cranial nerves II through XII are grossly intact. Proprioception is intact. No peripheral adenopathy or edema is identified. No motor or sensory levels are noted. Crude visual fields are within normal range.  RADIOLOGY RESULTS: mammograms reviewed and compatible with the above-stated findings  PLAN: present time patient is doing well with no evidence of disease. I'm please were overall progress. She is a rescheduled for follow-up mammograms in August. I have asked to see her back in 1 year for follow-up. She knows to call sooner with any concerns.  I would like to take this opportunity to thank you for allowing me to participate in the care of your patient.Noreene Filbert, MD

## 2017-06-28 NOTE — Progress Notes (Signed)
Patient here for follow up. No concerns today. 

## 2017-08-10 DIAGNOSIS — I10 Essential (primary) hypertension: Secondary | ICD-10-CM | POA: Diagnosis not present

## 2017-08-10 DIAGNOSIS — E785 Hyperlipidemia, unspecified: Secondary | ICD-10-CM | POA: Diagnosis not present

## 2017-08-10 DIAGNOSIS — E119 Type 2 diabetes mellitus without complications: Secondary | ICD-10-CM | POA: Diagnosis not present

## 2017-08-15 DIAGNOSIS — Z794 Long term (current) use of insulin: Secondary | ICD-10-CM | POA: Diagnosis not present

## 2017-08-15 DIAGNOSIS — D649 Anemia, unspecified: Secondary | ICD-10-CM | POA: Diagnosis not present

## 2017-08-15 DIAGNOSIS — E782 Mixed hyperlipidemia: Secondary | ICD-10-CM | POA: Diagnosis not present

## 2017-08-15 DIAGNOSIS — I10 Essential (primary) hypertension: Secondary | ICD-10-CM | POA: Diagnosis not present

## 2017-08-15 DIAGNOSIS — E119 Type 2 diabetes mellitus without complications: Secondary | ICD-10-CM | POA: Diagnosis not present

## 2017-09-19 ENCOUNTER — Other Ambulatory Visit: Payer: Self-pay

## 2017-09-19 DIAGNOSIS — C50211 Malignant neoplasm of upper-inner quadrant of right female breast: Secondary | ICD-10-CM

## 2017-09-19 DIAGNOSIS — Z171 Estrogen receptor negative status [ER-]: Principal | ICD-10-CM

## 2017-11-15 ENCOUNTER — Ambulatory Visit
Admission: RE | Admit: 2017-11-15 | Discharge: 2017-11-15 | Disposition: A | Discharge status: Home / Self Care | Payer: Medicare HMO | Source: Ambulatory Visit | Attending: General Surgery | Admitting: General Surgery

## 2017-11-15 DIAGNOSIS — R928 Other abnormal and inconclusive findings on diagnostic imaging of breast: Secondary | ICD-10-CM | POA: Diagnosis not present

## 2017-11-15 DIAGNOSIS — Z171 Estrogen receptor negative status [ER-]: Secondary | ICD-10-CM | POA: Insufficient documentation

## 2017-11-15 DIAGNOSIS — C50211 Malignant neoplasm of upper-inner quadrant of right female breast: Secondary | ICD-10-CM

## 2017-11-15 DIAGNOSIS — Z853 Personal history of malignant neoplasm of breast: Secondary | ICD-10-CM | POA: Diagnosis not present

## 2017-11-15 HISTORY — DX: Personal history of irradiation: Z92.3

## 2017-11-22 ENCOUNTER — Ambulatory Visit: Payer: Medicare HMO | Admitting: General Surgery

## 2017-11-22 ENCOUNTER — Encounter: Payer: Self-pay | Admitting: General Surgery

## 2017-11-22 VITALS — BP 150/70 | HR 74 | Resp 18 | Ht 62.0 in | Wt 242.0 lb

## 2017-11-22 DIAGNOSIS — C50211 Malignant neoplasm of upper-inner quadrant of right female breast: Secondary | ICD-10-CM

## 2017-11-22 DIAGNOSIS — Z171 Estrogen receptor negative status [ER-]: Secondary | ICD-10-CM | POA: Diagnosis not present

## 2017-11-22 NOTE — Patient Instructions (Addendum)
The patient is aware to call back for any questions or concerns.  The patient has been asked to return to the office in one year with a bilateral diagnostic mammogram.    CA 27.29 per Dr Lovie Macadamia in October

## 2017-11-22 NOTE — Progress Notes (Signed)
Patient ID: Alexis Greer, female   DOB: 1940/12/09, 77 y.o.   MRN: [id number]  Chief Complaint  Patient presents with  . Follow-up    HPI Alexis Greer is a 77 y.o. female.  who presents for her follow up right breast cancer and a breast evaluation, former patient of Dr Jamal Collin. The most recent mammogram was done on 11-15-17.  Patient does perform regular self breast checks and gets regular mammograms done.   No new breast issues. She is originally from [place] and moved here in 2005. HPI  Past Medical History:  Diagnosis Date  . Arthritis   . Asthma   . Breast cancer ([place]) 11/17/14   right breast lumpectomy, triple negative, with mammosite tx. Chemo with held due to CVA after lumpectomy  . Chronic kidney disease   . Complication of anesthesia    had a stroke during the breast lumpectomy  . Diabetes mellitus without complication ([place])   . Dyspnea   . Hyperlipidemia   . Hypertension   . Personal history of radiation therapy 2016    mammosite radiation-RIGHT lumpectomy  . Stroke [facility]) 2016   post lumpectomy    Past Surgical History:  Procedure Laterality Date  . ABDOMINAL HYSTERECTOMY  1979  . BREAST BIOPSY Right 11/17/2014   Invasive mammory carcinoma  . BREAST BIOPSY Right 12/01/2014   Procedure: BREAST BIOPSY WITH NEEDLE LOCALIZATION;  Surgeon: Christene Lye, MD;  Location: [facility] ORS;  Service: General;  Laterality: Right;  . BREAST LUMPECTOMY Right 2016   INVASIVE MAMMARY CARCINOMA   . BREAST LUMPECTOMY WITH SENTINEL LYMPH NODE BIOPSY Right 12/01/2014   Procedure: BREAST LUMPECTOMY WITH SENTINEL LYMPH NODE BX;  Surgeon: Christene Lye, MD;  Location: [facility] ORS;  Service: General;  Laterality: Right;  . COLONOSCOPY WITH PROPOFOL N/A 06/13/2016   Procedure: COLONOSCOPY WITH PROPOFOL;  Surgeon: Lollie Sails, MD;  Location: [facility] ENDOSCOPY;  Service: Endoscopy;  Laterality: N/A;  . LOWER EXTREMITY INTERVENTION Left 11/09/2016   Procedure: LOWER EXTREMITY  INTERVENTION;  Surgeon: Algernon Huxley, MD;  Location: [place] CV LAB;  Service: Cardiovascular;  Laterality: Left;  . OOPHORECTOMY  2014  . TONSILLECTOMY      Family History  Problem Relation Age of Onset  . Heart disease Mother   . Lung cancer Sister        smoker  . CAD Father   . Hyperlipidemia Father     Social History Social History   Tobacco Use  . Smoking status: Former Smoker    Last attempt to quit: 08/25/1993    Years since quitting: 24.2  . Smokeless tobacco: Never Used  Substance Use Topics  . Alcohol use: No    Alcohol/week: 0.0 standard drinks  . Drug use: No    Allergies  Allergen Reactions  . Oxycodone Other (See Comments)    It made the patient have shakes and jittery     Current Outpatient Medications  Medication Sig Dispense Refill  . ACCU-CHEK AVIVA PLUS test strip     . acetaminophen (TYLENOL) 500 MG tablet Take 1,000 mg by mouth every 6 (six) hours as needed for mild pain or headache.    . allopurinol (ZYLOPRIM) 100 MG tablet Take 100 mg by mouth 2 (two) times daily.     Marland Kitchen amLODipine (NORVASC) 10 MG tablet Take 10 mg by mouth at bedtime.     Marland Kitchen aspirin EC 81 MG tablet Take 81 mg by mouth daily.    Marland Kitchen atorvastatin (LIPITOR)  40 MG tablet Take 40 mg by mouth daily at 6 PM.    . carvedilol (COREG) 25 MG tablet Take 25 mg by mouth daily.    . Cholecalciferol (D-5000) 5000 UNITS TABS Take 5,000 Units by mouth daily.     . ferrous sulfate 325 (65 FE) MG tablet Take 325 mg by mouth daily.    Marland Kitchen glimepiride (AMARYL) 4 MG tablet Take 4 mg by mouth daily.     . insulin glargine (LANTUS) 100 UNIT/ML injection Inject 51 Units into the skin daily after breakfast.     . Menthol-Methyl Salicylate (MUSCLE RUB) 10-15 % CREA Apply 1 application topically as needed for muscle pain.    . Multiple Vitamin (MULTI-VITAMINS) TABS Take 1 tablet by mouth daily.     Marland Kitchen omeprazole (PRILOSEC) 20 MG capsule Take 40 mg by mouth daily. Takes 2 tablets daily    . valsartan  (DIOVAN) 160 MG tablet Take 160 mg by mouth daily.    . vitamin B-12 (CYANOCOBALAMIN) 1000 MCG tablet Take 1,000 mcg by mouth daily.    . vitamin E 400 UNIT capsule Take 400 Units by mouth daily.     . chlorthalidone (HYGROTON) 25 MG tablet Take 25 mg by mouth daily.      Current Facility-Administered Medications  Medication Dose Route Frequency Provider Last Rate Last Dose  . ondansetron (ZOFRAN) injection 4 mg  4 mg Intravenous Once Dew, Erskine Squibb, MD        Review of Systems Review of Systems  Constitutional: Negative.   Respiratory: Negative.   Cardiovascular: Negative.     Blood pressure (!) 150/70, pulse 74, resp. rate 18, height 5\' 2"  (1.575 m), weight 242 lb (109.8 kg), SpO2 97 %.  Physical Exam Physical Exam  Constitutional: She is oriented to person, place, and time. She appears well-developed and well-nourished.  HENT:  Mouth/Throat: Oropharynx is clear and moist.  Eyes: Conjunctivae are normal. No scleral icterus.  Neck: Neck supple.  Cardiovascular: Normal rate, regular rhythm and normal heart sounds.  Pulmonary/Chest: Effort normal and breath sounds normal. Right breast exhibits no inverted nipple, no mass, no nipple discharge, no skin change and no tenderness. Left breast exhibits no inverted nipple, no mass, no nipple discharge, no skin change and no tenderness.  Right lumpectomy site well healed.    Lymphadenopathy:    She has no cervical adenopathy.    She has no axillary adenopathy.       Right: No supraclavicular adenopathy present.       Left: No supraclavicular adenopathy present.  Neurological: She is alert and oriented to person, place, and time.  Skin: Skin is warm and dry.  Hemangioma left heel   Psychiatric: Her behavior is normal.    Data Reviewed November 23, 2016 CA 27-29 was normal at 25.9.  November 15, 2017 bilateral diagnostic mammograms were reviewed.  Postsurgical changes on the right.  Near fatty replaced breast.  BI-RADS-2.  Assessment     No evidence of recurrence of her previously resected triple negative cancer.      Plan    We will request that a CA 27.29 he added to her laboratory panel for her upcoming physical with Dr Lovie Macadamia scheduled in October  The patient has been asked to return to the office in one year with a bilateral diagnostic mammogram. The patient is aware to call back for any questions or new concerns.      HPI, Physical Exam, Assessment and Plan have been scribed under  the direction and in the presence of Robert Bellow, MD. Karie Fetch, RN  I have completed the exam and reviewed the above documentation for accuracy and completeness.  I agree with the above.  Haematologist has been used and any errors in dictation or transcription are unintentional.  Hervey Ard, M.D., F.A.C.S.  Forest Gleason Byrnett 11/22/2017, 2:34 PM

## 2017-12-13 DIAGNOSIS — E1122 Type 2 diabetes mellitus with diabetic chronic kidney disease: Secondary | ICD-10-CM | POA: Diagnosis not present

## 2017-12-13 DIAGNOSIS — E1169 Type 2 diabetes mellitus with other specified complication: Secondary | ICD-10-CM | POA: Diagnosis not present

## 2017-12-13 DIAGNOSIS — E669 Obesity, unspecified: Secondary | ICD-10-CM | POA: Diagnosis not present

## 2017-12-13 DIAGNOSIS — N183 Chronic kidney disease, stage 3 (moderate): Secondary | ICD-10-CM | POA: Diagnosis not present

## 2017-12-13 DIAGNOSIS — E1165 Type 2 diabetes mellitus with hyperglycemia: Secondary | ICD-10-CM | POA: Diagnosis not present

## 2017-12-13 DIAGNOSIS — Z794 Long term (current) use of insulin: Secondary | ICD-10-CM | POA: Diagnosis not present

## 2017-12-25 DIAGNOSIS — E1165 Type 2 diabetes mellitus with hyperglycemia: Secondary | ICD-10-CM | POA: Diagnosis not present

## 2018-01-10 DIAGNOSIS — E119 Type 2 diabetes mellitus without complications: Secondary | ICD-10-CM | POA: Diagnosis not present

## 2018-01-10 DIAGNOSIS — E782 Mixed hyperlipidemia: Secondary | ICD-10-CM | POA: Diagnosis not present

## 2018-01-10 DIAGNOSIS — Z794 Long term (current) use of insulin: Secondary | ICD-10-CM | POA: Diagnosis not present

## 2018-01-10 DIAGNOSIS — I10 Essential (primary) hypertension: Secondary | ICD-10-CM | POA: Diagnosis not present

## 2018-01-14 DIAGNOSIS — Z0000 Encounter for general adult medical examination without abnormal findings: Secondary | ICD-10-CM | POA: Diagnosis not present

## 2018-01-14 DIAGNOSIS — C50211 Malignant neoplasm of upper-inner quadrant of right female breast: Secondary | ICD-10-CM | POA: Diagnosis not present

## 2018-01-14 DIAGNOSIS — N183 Chronic kidney disease, stage 3 (moderate): Secondary | ICD-10-CM | POA: Diagnosis not present

## 2018-01-14 DIAGNOSIS — E1122 Type 2 diabetes mellitus with diabetic chronic kidney disease: Secondary | ICD-10-CM | POA: Diagnosis not present

## 2018-01-14 DIAGNOSIS — E782 Mixed hyperlipidemia: Secondary | ICD-10-CM | POA: Diagnosis not present

## 2018-01-14 DIAGNOSIS — N289 Disorder of kidney and ureter, unspecified: Secondary | ICD-10-CM | POA: Diagnosis not present

## 2018-01-14 DIAGNOSIS — Z794 Long term (current) use of insulin: Secondary | ICD-10-CM | POA: Diagnosis not present

## 2018-01-14 DIAGNOSIS — I10 Essential (primary) hypertension: Secondary | ICD-10-CM | POA: Diagnosis not present

## 2018-01-14 DIAGNOSIS — K219 Gastro-esophageal reflux disease without esophagitis: Secondary | ICD-10-CM | POA: Diagnosis not present

## 2018-01-23 DIAGNOSIS — E1165 Type 2 diabetes mellitus with hyperglycemia: Secondary | ICD-10-CM | POA: Diagnosis not present

## 2018-03-19 DIAGNOSIS — E1169 Type 2 diabetes mellitus with other specified complication: Secondary | ICD-10-CM | POA: Diagnosis not present

## 2018-03-19 DIAGNOSIS — E669 Obesity, unspecified: Secondary | ICD-10-CM | POA: Diagnosis not present

## 2018-03-19 DIAGNOSIS — E1122 Type 2 diabetes mellitus with diabetic chronic kidney disease: Secondary | ICD-10-CM | POA: Diagnosis not present

## 2018-03-19 DIAGNOSIS — Z794 Long term (current) use of insulin: Secondary | ICD-10-CM | POA: Diagnosis not present

## 2018-03-19 DIAGNOSIS — N183 Chronic kidney disease, stage 3 (moderate): Secondary | ICD-10-CM | POA: Diagnosis not present

## 2018-03-27 HISTORY — PX: CHOLECYSTECTOMY: SHX55

## 2018-03-28 DIAGNOSIS — H43393 Other vitreous opacities, bilateral: Secondary | ICD-10-CM | POA: Diagnosis not present

## 2018-04-17 DIAGNOSIS — Z853 Personal history of malignant neoplasm of breast: Secondary | ICD-10-CM | POA: Diagnosis not present

## 2018-04-17 DIAGNOSIS — I10 Essential (primary) hypertension: Secondary | ICD-10-CM | POA: Diagnosis not present

## 2018-04-17 DIAGNOSIS — M1A9XX0 Chronic gout, unspecified, without tophus (tophi): Secondary | ICD-10-CM | POA: Diagnosis not present

## 2018-04-17 DIAGNOSIS — E1122 Type 2 diabetes mellitus with diabetic chronic kidney disease: Secondary | ICD-10-CM | POA: Diagnosis not present

## 2018-04-17 DIAGNOSIS — N183 Chronic kidney disease, stage 3 (moderate): Secondary | ICD-10-CM | POA: Diagnosis not present

## 2018-06-21 DIAGNOSIS — E669 Obesity, unspecified: Secondary | ICD-10-CM | POA: Diagnosis not present

## 2018-06-21 DIAGNOSIS — Z794 Long term (current) use of insulin: Secondary | ICD-10-CM | POA: Diagnosis not present

## 2018-06-21 DIAGNOSIS — N183 Chronic kidney disease, stage 3 (moderate): Secondary | ICD-10-CM | POA: Diagnosis not present

## 2018-06-21 DIAGNOSIS — E1122 Type 2 diabetes mellitus with diabetic chronic kidney disease: Secondary | ICD-10-CM | POA: Diagnosis not present

## 2018-06-21 DIAGNOSIS — E1169 Type 2 diabetes mellitus with other specified complication: Secondary | ICD-10-CM | POA: Diagnosis not present

## 2018-07-04 ENCOUNTER — Ambulatory Visit: Payer: Medicare HMO | Admitting: Radiation Oncology

## 2018-07-22 ENCOUNTER — Encounter: Payer: Self-pay | Admitting: *Deleted

## 2018-07-31 ENCOUNTER — Ambulatory Visit: Payer: Medicare HMO | Admitting: Radiation Oncology

## 2018-09-13 DIAGNOSIS — E1122 Type 2 diabetes mellitus with diabetic chronic kidney disease: Secondary | ICD-10-CM | POA: Diagnosis not present

## 2018-09-13 DIAGNOSIS — D649 Anemia, unspecified: Secondary | ICD-10-CM | POA: Diagnosis not present

## 2018-09-13 DIAGNOSIS — E782 Mixed hyperlipidemia: Secondary | ICD-10-CM | POA: Diagnosis not present

## 2018-09-13 DIAGNOSIS — N289 Disorder of kidney and ureter, unspecified: Secondary | ICD-10-CM | POA: Diagnosis not present

## 2018-09-13 DIAGNOSIS — C50211 Malignant neoplasm of upper-inner quadrant of right female breast: Secondary | ICD-10-CM | POA: Diagnosis not present

## 2018-09-13 DIAGNOSIS — M1A9XX0 Chronic gout, unspecified, without tophus (tophi): Secondary | ICD-10-CM | POA: Diagnosis not present

## 2018-09-13 DIAGNOSIS — I10 Essential (primary) hypertension: Secondary | ICD-10-CM | POA: Diagnosis not present

## 2018-09-13 DIAGNOSIS — N184 Chronic kidney disease, stage 4 (severe): Secondary | ICD-10-CM | POA: Diagnosis not present

## 2018-09-24 DIAGNOSIS — Z794 Long term (current) use of insulin: Secondary | ICD-10-CM | POA: Diagnosis not present

## 2018-09-24 DIAGNOSIS — E1122 Type 2 diabetes mellitus with diabetic chronic kidney disease: Secondary | ICD-10-CM | POA: Diagnosis not present

## 2018-09-24 DIAGNOSIS — N183 Chronic kidney disease, stage 3 (moderate): Secondary | ICD-10-CM | POA: Diagnosis not present

## 2018-09-24 DIAGNOSIS — D649 Anemia, unspecified: Secondary | ICD-10-CM | POA: Diagnosis not present

## 2018-09-24 DIAGNOSIS — E782 Mixed hyperlipidemia: Secondary | ICD-10-CM | POA: Diagnosis not present

## 2018-10-01 ENCOUNTER — Other Ambulatory Visit: Payer: Self-pay

## 2018-10-01 DIAGNOSIS — E1122 Type 2 diabetes mellitus with diabetic chronic kidney disease: Secondary | ICD-10-CM | POA: Diagnosis not present

## 2018-10-01 DIAGNOSIS — E11649 Type 2 diabetes mellitus with hypoglycemia without coma: Secondary | ICD-10-CM | POA: Diagnosis not present

## 2018-10-01 DIAGNOSIS — E1169 Type 2 diabetes mellitus with other specified complication: Secondary | ICD-10-CM | POA: Diagnosis not present

## 2018-10-01 DIAGNOSIS — E669 Obesity, unspecified: Secondary | ICD-10-CM | POA: Diagnosis not present

## 2018-10-01 DIAGNOSIS — N183 Chronic kidney disease, stage 3 (moderate): Secondary | ICD-10-CM | POA: Diagnosis not present

## 2018-10-01 DIAGNOSIS — Z794 Long term (current) use of insulin: Secondary | ICD-10-CM | POA: Diagnosis not present

## 2018-10-02 ENCOUNTER — Ambulatory Visit
Admission: RE | Admit: 2018-10-02 | Discharge: 2018-10-02 | Disposition: A | Discharge status: Home / Self Care | Payer: Medicare HMO | Source: Ambulatory Visit | Attending: Radiation Oncology | Admitting: Radiation Oncology

## 2018-10-02 ENCOUNTER — Other Ambulatory Visit: Payer: Self-pay

## 2018-10-02 ENCOUNTER — Encounter: Payer: Self-pay | Admitting: Radiation Oncology

## 2018-10-02 VITALS — BP 135/65 | HR 75 | Resp 16 | Wt 236.6 lb

## 2018-10-02 DIAGNOSIS — C50411 Malignant neoplasm of upper-outer quadrant of right female breast: Secondary | ICD-10-CM

## 2018-10-02 DIAGNOSIS — Z171 Estrogen receptor negative status [ER-]: Secondary | ICD-10-CM

## 2018-10-02 DIAGNOSIS — Z923 Personal history of irradiation: Secondary | ICD-10-CM | POA: Insufficient documentation

## 2018-10-02 DIAGNOSIS — Z853 Personal history of malignant neoplasm of breast: Secondary | ICD-10-CM | POA: Diagnosis not present

## 2018-10-02 NOTE — Progress Notes (Signed)
Radiation Oncology Follow up Note  Name: Alexis Greer   Date:   10/02/2018 MRN:  [id number] DOB: 1940-11-07    This 78 y.o. female presents to the clinic today for 3-1/2-year follow-up status post accelerated partial breast radiation to her right breast for stage I invasive mammary carcinoma.  REFERRING PROVIDER: Juluis Pitch, MD  HPI: Patient is a 78 year old female now about 3-1/2 years having completed accelerated partial breast irradiation to her right breast for stage I invasive mammary carcinoma.  Seen today in routine follow-up she is doing well specifically denies breast tenderness cough or bone pain..  She is not on antiestrogen therapy based on the triple negative nature of her disease.  Her last mammogram was last August which I have reviewed was BI-RADS 2 benign she is another one scheduled for next month.  COMPLICATIONS OF TREATMENT: none  FOLLOW UP COMPLIANCE: keeps appointments   PHYSICAL EXAM:  BP 135/65 (BP Location: Left Arm, Patient Position: Sitting)   Pulse 75   Resp 16   Wt 236 lb 8.9 oz (107.3 kg)   BMI 43.27 kg/m  Lungs are clear to A&P cardiac examination essentially unremarkable with regular rate and rhythm. No dominant mass or nodularity is noted in either breast in 2 positions examined. Incision is well-healed. No axillary or supraclavicular adenopathy is appreciated. Cosmetic result is excellent.  Well-developed well-nourished patient in NAD. HEENT reveals PERLA, EOMI, discs not visualized.  Oral cavity is clear. No oral mucosal lesions are identified. Neck is clear without evidence of cervical or supraclavicular adenopathy. Lungs are clear to A&P. Cardiac examination is essentially unremarkable with regular rate and rhythm without murmur rub or thrill. Abdomen is benign with no organomegaly or masses noted. Motor sensory and DTR levels are equal and symmetric in the upper and lower extremities. Cranial nerves II through XII are grossly intact.  Proprioception is intact. No peripheral adenopathy or edema is identified. No motor or sensory levels are noted. Crude visual fields are within normal range.  RADIOLOGY RESULTS: Mammograms reviewed compatible with above-stated findings  PLAN: Present time she continues to do well with no evidence of disease.  I am pleased with her overall progress.  I have asked to see her back in 1 year for follow-up at that point I will discontinue follow-up care.  Patient knows to copy me on her mammograms for my review.  Patient knows to call at anytime with any concerns.  I would like to take this opportunity to thank you for allowing me to participate in the care of your patient.Noreene Filbert, MD

## 2018-10-14 ENCOUNTER — Other Ambulatory Visit: Payer: Self-pay | Admitting: *Deleted

## 2018-10-14 DIAGNOSIS — C50211 Malignant neoplasm of upper-inner quadrant of right female breast: Secondary | ICD-10-CM

## 2018-10-15 ENCOUNTER — Encounter: Payer: Self-pay | Admitting: General Surgery

## 2018-10-30 ENCOUNTER — Inpatient Hospital Stay: Payer: Medicare HMO

## 2018-10-30 ENCOUNTER — Inpatient Hospital Stay
Admission: EM | Admit: 2018-10-30 | Discharge: 2018-11-03 | DRG: 417 | Disposition: A | Discharge status: Home / Self Care | Payer: Medicare HMO | Attending: Internal Medicine | Admitting: Internal Medicine

## 2018-10-30 ENCOUNTER — Other Ambulatory Visit: Payer: Self-pay

## 2018-10-30 ENCOUNTER — Encounter: Payer: Self-pay | Admitting: Emergency Medicine

## 2018-10-30 ENCOUNTER — Emergency Department: Payer: Medicare HMO

## 2018-10-30 DIAGNOSIS — Z90710 Acquired absence of both cervix and uterus: Secondary | ICD-10-CM

## 2018-10-30 DIAGNOSIS — E860 Dehydration: Secondary | ICD-10-CM | POA: Diagnosis present

## 2018-10-30 DIAGNOSIS — E785 Hyperlipidemia, unspecified: Secondary | ICD-10-CM | POA: Diagnosis present

## 2018-10-30 DIAGNOSIS — E1122 Type 2 diabetes mellitus with diabetic chronic kidney disease: Secondary | ICD-10-CM | POA: Diagnosis present

## 2018-10-30 DIAGNOSIS — Z87891 Personal history of nicotine dependence: Secondary | ICD-10-CM

## 2018-10-30 DIAGNOSIS — K219 Gastro-esophageal reflux disease without esophagitis: Secondary | ICD-10-CM | POA: Diagnosis present

## 2018-10-30 DIAGNOSIS — N189 Chronic kidney disease, unspecified: Secondary | ICD-10-CM

## 2018-10-30 DIAGNOSIS — K828 Other specified diseases of gallbladder: Secondary | ICD-10-CM | POA: Diagnosis present

## 2018-10-30 DIAGNOSIS — N183 Chronic kidney disease, stage 3 (moderate): Secondary | ICD-10-CM | POA: Diagnosis present

## 2018-10-30 DIAGNOSIS — K760 Fatty (change of) liver, not elsewhere classified: Secondary | ICD-10-CM | POA: Diagnosis present

## 2018-10-30 DIAGNOSIS — N19 Unspecified kidney failure: Secondary | ICD-10-CM | POA: Diagnosis not present

## 2018-10-30 DIAGNOSIS — D638 Anemia in other chronic diseases classified elsewhere: Secondary | ICD-10-CM | POA: Diagnosis present

## 2018-10-30 DIAGNOSIS — Z885 Allergy status to narcotic agent status: Secondary | ICD-10-CM

## 2018-10-30 DIAGNOSIS — R000 Tachycardia, unspecified: Secondary | ICD-10-CM | POA: Diagnosis not present

## 2018-10-30 DIAGNOSIS — N179 Acute kidney failure, unspecified: Secondary | ICD-10-CM | POA: Diagnosis present

## 2018-10-30 DIAGNOSIS — Z8673 Personal history of transient ischemic attack (TIA), and cerebral infarction without residual deficits: Secondary | ICD-10-CM

## 2018-10-30 DIAGNOSIS — R112 Nausea with vomiting, unspecified: Secondary | ICD-10-CM | POA: Diagnosis not present

## 2018-10-30 DIAGNOSIS — Z8249 Family history of ischemic heart disease and other diseases of the circulatory system: Secondary | ICD-10-CM

## 2018-10-30 DIAGNOSIS — R740 Nonspecific elevation of levels of transaminase and lactic acid dehydrogenase [LDH]: Secondary | ICD-10-CM | POA: Diagnosis not present

## 2018-10-30 DIAGNOSIS — Z8349 Family history of other endocrine, nutritional and metabolic diseases: Secondary | ICD-10-CM

## 2018-10-30 DIAGNOSIS — J45909 Unspecified asthma, uncomplicated: Secondary | ICD-10-CM | POA: Diagnosis present

## 2018-10-30 DIAGNOSIS — K8050 Calculus of bile duct without cholangitis or cholecystitis without obstruction: Secondary | ICD-10-CM | POA: Diagnosis not present

## 2018-10-30 DIAGNOSIS — Z794 Long term (current) use of insulin: Secondary | ICD-10-CM

## 2018-10-30 DIAGNOSIS — E119 Type 2 diabetes mellitus without complications: Secondary | ICD-10-CM | POA: Diagnosis not present

## 2018-10-30 DIAGNOSIS — Z9011 Acquired absence of right breast and nipple: Secondary | ICD-10-CM

## 2018-10-30 DIAGNOSIS — R109 Unspecified abdominal pain: Secondary | ICD-10-CM | POA: Diagnosis not present

## 2018-10-30 DIAGNOSIS — I129 Hypertensive chronic kidney disease with stage 1 through stage 4 chronic kidney disease, or unspecified chronic kidney disease: Secondary | ICD-10-CM | POA: Diagnosis present

## 2018-10-30 DIAGNOSIS — R945 Abnormal results of liver function studies: Secondary | ICD-10-CM

## 2018-10-30 DIAGNOSIS — R7989 Other specified abnormal findings of blood chemistry: Secondary | ICD-10-CM

## 2018-10-30 DIAGNOSIS — E11649 Type 2 diabetes mellitus with hypoglycemia without coma: Secondary | ICD-10-CM | POA: Diagnosis present

## 2018-10-30 DIAGNOSIS — R3915 Urgency of urination: Secondary | ICD-10-CM | POA: Diagnosis not present

## 2018-10-30 DIAGNOSIS — Z8719 Personal history of other diseases of the digestive system: Secondary | ICD-10-CM

## 2018-10-30 DIAGNOSIS — Z20828 Contact with and (suspected) exposure to other viral communicable diseases: Secondary | ICD-10-CM | POA: Diagnosis not present

## 2018-10-30 DIAGNOSIS — R509 Fever, unspecified: Secondary | ICD-10-CM | POA: Diagnosis not present

## 2018-10-30 DIAGNOSIS — I10 Essential (primary) hypertension: Secondary | ICD-10-CM | POA: Diagnosis not present

## 2018-10-30 DIAGNOSIS — R1013 Epigastric pain: Secondary | ICD-10-CM | POA: Diagnosis not present

## 2018-10-30 DIAGNOSIS — D509 Iron deficiency anemia, unspecified: Secondary | ICD-10-CM | POA: Diagnosis present

## 2018-10-30 DIAGNOSIS — R531 Weakness: Secondary | ICD-10-CM

## 2018-10-30 DIAGNOSIS — Z86010 Personal history of colonic polyps: Secondary | ICD-10-CM

## 2018-10-30 DIAGNOSIS — Z853 Personal history of malignant neoplasm of breast: Secondary | ICD-10-CM | POA: Diagnosis not present

## 2018-10-30 DIAGNOSIS — N281 Cyst of kidney, acquired: Secondary | ICD-10-CM | POA: Diagnosis not present

## 2018-10-30 DIAGNOSIS — Z6841 Body Mass Index (BMI) 40.0 and over, adult: Secondary | ICD-10-CM

## 2018-10-30 DIAGNOSIS — Z7982 Long term (current) use of aspirin: Secondary | ICD-10-CM | POA: Diagnosis not present

## 2018-10-30 DIAGNOSIS — K8010 Calculus of gallbladder with chronic cholecystitis without obstruction: Principal | ICD-10-CM | POA: Diagnosis present

## 2018-10-30 DIAGNOSIS — R1011 Right upper quadrant pain: Secondary | ICD-10-CM | POA: Diagnosis not present

## 2018-10-30 DIAGNOSIS — Z79899 Other long term (current) drug therapy: Secondary | ICD-10-CM

## 2018-10-30 DIAGNOSIS — R937 Abnormal findings on diagnostic imaging of other parts of musculoskeletal system: Secondary | ICD-10-CM | POA: Diagnosis not present

## 2018-10-30 DIAGNOSIS — Z03818 Encounter for observation for suspected exposure to other biological agents ruled out: Secondary | ICD-10-CM | POA: Diagnosis not present

## 2018-10-30 DIAGNOSIS — E162 Hypoglycemia, unspecified: Secondary | ICD-10-CM | POA: Diagnosis not present

## 2018-10-30 DIAGNOSIS — Z801 Family history of malignant neoplasm of trachea, bronchus and lung: Secondary | ICD-10-CM

## 2018-10-30 DIAGNOSIS — Z923 Personal history of irradiation: Secondary | ICD-10-CM

## 2018-10-30 DIAGNOSIS — N170 Acute kidney failure with tubular necrosis: Secondary | ICD-10-CM | POA: Diagnosis not present

## 2018-10-30 DIAGNOSIS — K8020 Calculus of gallbladder without cholecystitis without obstruction: Secondary | ICD-10-CM | POA: Diagnosis not present

## 2018-10-30 DIAGNOSIS — R0600 Dyspnea, unspecified: Secondary | ICD-10-CM | POA: Diagnosis not present

## 2018-10-30 HISTORY — DX: Pneumonia, unspecified organism: J18.9

## 2018-10-30 LAB — COMPREHENSIVE METABOLIC PANEL
ALT: 323 U/L — ABNORMAL HIGH (ref 0–44)
AST: 193 U/L — ABNORMAL HIGH (ref 15–41)
Albumin: 3.5 g/dL (ref 3.5–5.0)
Alkaline Phosphatase: 187 U/L — ABNORMAL HIGH (ref 38–126)
Anion gap: 13 (ref 5–15)
BUN: 50 mg/dL — ABNORMAL HIGH (ref 8–23)
CO2: 20 mmol/L — ABNORMAL LOW (ref 22–32)
Calcium: 9.7 mg/dL (ref 8.9–10.3)
Chloride: 104 mmol/L (ref 98–111)
Creatinine, Ser: 3.44 mg/dL — ABNORMAL HIGH (ref 0.44–1.00)
GFR calc Af Amer: 14 mL/min — ABNORMAL LOW (ref >60)
GFR calc non Af Amer: 12 mL/min — ABNORMAL LOW (ref >60)
Glucose, Bld: 90 mg/dL (ref 70–99)
Potassium: 4.3 mmol/L (ref 3.5–5.1)
Sodium: 137 mmol/L (ref 135–145)
Total Bilirubin: 4 mg/dL — ABNORMAL HIGH (ref 0.3–1.2)
Total Protein: 6.9 g/dL (ref 6.5–8.1)

## 2018-10-30 LAB — CBC WITH DIFFERENTIAL/PLATELET
Abs Immature Granulocytes: 0.09 10*3/uL — ABNORMAL HIGH (ref 0.00–0.07)
Basophils Absolute: 0 10*3/uL (ref 0.0–0.1)
Basophils Relative: 0 %
Eosinophils Absolute: 0 10*3/uL (ref 0.0–0.5)
Eosinophils Relative: 0 %
HCT: 31.2 % — ABNORMAL LOW (ref 36.0–46.0)
Hemoglobin: 10.2 g/dL — ABNORMAL LOW (ref 12.0–15.0)
Immature Granulocytes: 1 %
Lymphocytes Relative: 5 %
Lymphs Abs: 0.5 10*3/uL — ABNORMAL LOW (ref 0.7–4.0)
MCH: 30.4 pg (ref 26.0–34.0)
MCHC: 32.7 g/dL (ref 30.0–36.0)
MCV: 92.9 fL (ref 80.0–100.0)
Monocytes Absolute: 0.7 10*3/uL (ref 0.1–1.0)
Monocytes Relative: 6 %
Neutro Abs: 10.5 10*3/uL — ABNORMAL HIGH (ref 1.7–7.7)
Neutrophils Relative %: 88 %
Platelets: 201 10*3/uL (ref 150–400)
RBC: 3.36 MIL/uL — ABNORMAL LOW (ref 3.87–5.11)
RDW: 15 % (ref 11.5–15.5)
WBC: 11.9 10*3/uL — ABNORMAL HIGH (ref 4.0–10.5)
nRBC: 0 % (ref 0.0–0.2)

## 2018-10-30 LAB — HEPATIC FUNCTION PANEL
ALT: 283 U/L — ABNORMAL HIGH (ref 0–44)
AST: 159 U/L — ABNORMAL HIGH (ref 15–41)
Albumin: 3.3 g/dL — ABNORMAL LOW (ref 3.5–5.0)
Alkaline Phosphatase: 181 U/L — ABNORMAL HIGH (ref 38–126)
Bilirubin, Direct: 2.2 mg/dL — ABNORMAL HIGH (ref 0.0–0.2)
Indirect Bilirubin: 0.8 mg/dL (ref 0.3–0.9)
Total Bilirubin: 3 mg/dL — ABNORMAL HIGH (ref 0.3–1.2)
Total Protein: 6.5 g/dL (ref 6.5–8.1)

## 2018-10-30 LAB — GLUCOSE, CAPILLARY
Glucose-Capillary: 51 mg/dL — ABNORMAL LOW (ref 70–99)
Glucose-Capillary: 60 mg/dL — ABNORMAL LOW (ref 70–99)
Glucose-Capillary: 64 mg/dL — ABNORMAL LOW (ref 70–99)
Glucose-Capillary: 90 mg/dL (ref 70–99)
Glucose-Capillary: 95 mg/dL (ref 70–99)

## 2018-10-30 LAB — URINALYSIS, COMPLETE (UACMP) WITH MICROSCOPIC
Bilirubin Urine: NEGATIVE
Glucose, UA: NEGATIVE mg/dL
Hgb urine dipstick: NEGATIVE
Ketones, ur: NEGATIVE mg/dL
Nitrite: NEGATIVE
Protein, ur: NEGATIVE mg/dL
Specific Gravity, Urine: 1.014 (ref 1.005–1.030)
pH: 5 (ref 5.0–8.0)

## 2018-10-30 LAB — BILIRUBIN, FRACTIONATED(TOT/DIR/INDIR)
Bilirubin, Direct: 2.1 mg/dL — ABNORMAL HIGH (ref 0.0–0.2)
Indirect Bilirubin: 1 mg/dL — ABNORMAL HIGH (ref 0.3–0.9)
Total Bilirubin: 3.1 mg/dL — ABNORMAL HIGH (ref 0.3–1.2)

## 2018-10-30 LAB — SARS CORONAVIRUS 2 BY RT PCR (HOSPITAL ORDER, PERFORMED IN ~~LOC~~ HOSPITAL LAB): SARS Coronavirus 2: NEGATIVE

## 2018-10-30 LAB — TROPONIN I (HIGH SENSITIVITY): Troponin I (High Sensitivity): 17 ng/L (ref <18)

## 2018-10-30 MED ORDER — HEPARIN SODIUM (PORCINE) 5000 UNIT/ML IJ SOLN
5000.0000 [IU] | Freq: Three times a day (TID) | INTRAMUSCULAR | Status: DC
  Administered 2018-10-30 – 2018-11-03 (×10): 5000 [IU] via SUBCUTANEOUS
  Filled 2018-10-30 (×10): qty 1

## 2018-10-30 MED ORDER — VITAMIN D 25 MCG (1000 UNIT) PO TABS
5000.0000 [IU] | ORAL_TABLET | Freq: Every day | ORAL | Status: DC
  Administered 2018-10-30 – 2018-11-03 (×4): 5000 [IU] via ORAL
  Filled 2018-10-30 (×4): qty 5

## 2018-10-30 MED ORDER — ONDANSETRON HCL 4 MG PO TABS: 4.0000 mg | ORAL_TABLET | Freq: Four times a day (QID) | ORAL | Status: DC | PRN

## 2018-10-30 MED ORDER — ATORVASTATIN CALCIUM 20 MG PO TABS
40.0000 mg | ORAL_TABLET | Freq: Every day | ORAL | Status: DC
  Administered 2018-10-30: 40 mg via ORAL
  Filled 2018-10-30: qty 2

## 2018-10-30 MED ORDER — DEXTROSE-NACL 5-0.9 % IV SOLN
INTRAVENOUS | Status: DC
  Administered 2018-10-30 – 2018-10-31 (×2): via INTRAVENOUS

## 2018-10-30 MED ORDER — PANTOPRAZOLE SODIUM 40 MG PO TBEC
40.0000 mg | DELAYED_RELEASE_TABLET | Freq: Every day | ORAL | Status: DC
  Administered 2018-11-01 – 2018-11-03 (×3): 40 mg via ORAL
  Filled 2018-10-30 (×3): qty 1

## 2018-10-30 MED ORDER — VITAMIN E 180 MG (400 UNIT) PO CAPS
400.0000 [IU] | ORAL_CAPSULE | Freq: Every day | ORAL | Status: DC
  Administered 2018-11-01 – 2018-11-03 (×3): 400 [IU] via ORAL
  Filled 2018-10-30 (×4): qty 1

## 2018-10-30 MED ORDER — ACETAMINOPHEN 500 MG PO TABS: 1000.0000 mg | ORAL_TABLET | Freq: Four times a day (QID) | ORAL | Status: DC | PRN

## 2018-10-30 MED ORDER — VITAMIN B-12 1000 MCG PO TABS
1000.0000 ug | ORAL_TABLET | Freq: Every day | ORAL | Status: DC
  Administered 2018-11-01 – 2018-11-03 (×3): 1000 ug via ORAL
  Filled 2018-10-30 (×3): qty 1

## 2018-10-30 MED ORDER — ALLOPURINOL 100 MG PO TABS
100.0000 mg | ORAL_TABLET | Freq: Two times a day (BID) | ORAL | Status: DC
  Administered 2018-10-30 – 2018-11-03 (×7): 100 mg via ORAL
  Filled 2018-10-30 (×8): qty 1

## 2018-10-30 MED ORDER — SENNOSIDES-DOCUSATE SODIUM 8.6-50 MG PO TABS: 1.0000 | ORAL_TABLET | Freq: Every evening | ORAL | Status: DC | PRN

## 2018-10-30 MED ORDER — ONDANSETRON HCL 4 MG/2ML IJ SOLN
4.0000 mg | Freq: Four times a day (QID) | INTRAMUSCULAR | Status: DC | PRN
  Administered 2018-10-31: 15:00:00 4 mg via INTRAVENOUS

## 2018-10-30 MED ORDER — AMLODIPINE BESYLATE 10 MG PO TABS
10.0000 mg | ORAL_TABLET | Freq: Every day | ORAL | Status: DC
  Administered 2018-10-30: 10 mg via ORAL
  Filled 2018-10-30: qty 1

## 2018-10-30 MED ORDER — ADULT MULTIVITAMIN W/MINERALS CH
1.0000 | ORAL_TABLET | Freq: Every day | ORAL | Status: DC
  Administered 2018-10-30 – 2018-11-03 (×4): 1 via ORAL
  Filled 2018-10-30 (×4): qty 1

## 2018-10-30 MED ORDER — CARVEDILOL 25 MG PO TABS
25.0000 mg | ORAL_TABLET | Freq: Every day | ORAL | Status: DC
  Administered 2018-10-30: 25 mg via ORAL
  Filled 2018-10-30: qty 1

## 2018-10-30 MED ORDER — FERROUS SULFATE 325 (65 FE) MG PO TABS
325.0000 mg | ORAL_TABLET | Freq: Every day | ORAL | Status: DC
  Administered 2018-11-01 – 2018-11-03 (×3): 325 mg via ORAL
  Filled 2018-10-30 (×4): qty 1

## 2018-10-30 MED ORDER — IRBESARTAN 75 MG PO TABS
37.5000 mg | ORAL_TABLET | Freq: Every day | ORAL | Status: DC
  Filled 2018-10-30: qty 0.5

## 2018-10-30 MED ORDER — ASPIRIN EC 81 MG PO TBEC
81.0000 mg | DELAYED_RELEASE_TABLET | Freq: Every day | ORAL | Status: DC
  Administered 2018-10-30 – 2018-11-03 (×4): 81 mg via ORAL
  Filled 2018-10-30 (×4): qty 1

## 2018-10-30 MED ORDER — SODIUM CHLORIDE 0.9 % IV SOLN
Freq: Once | INTRAVENOUS | Status: AC
  Administered 2018-10-30: 14:00:00 via INTRAVENOUS

## 2018-10-30 NOTE — Progress Notes (Signed)
Advanced care plan. Purpose of the Encounter: CODE STATUS Parties in Attendance: Patient Patient's Decision Capacity: Good Subjective/Patient's story: Alexis Greer  is a 78 y.o. female with a known history of breast cancer, chronic kidney disease stage III, type 2 diabetes mellitus, hypertension presented to the emergency room for generalized weakness.  On Monday patient had some nausea vomiting and chills she.  He also had some epigastric right upper quadrant discomfort.  Abdominal pain was aching in nature.  Abdominal pain nausea and vomiting improved.  She was evaluated in the emergency room kidney functions have worsened and liver function tests are elevated.  Renal ultrasound was done which showed medical renal disease but no hydronephrosis or obstruction.  Abdominal ultrasound showed gallbladder sludge and tiny calculi.  Hospitalist service was consulted for further care.  COVID-19 test negative. Objective/Medical story Patient needs IV fluid hydration for renal failure. Needs gastroenterology and surgery evaluation Needs monitoring of LFTs Goals of care determination:  Advance care directives goals of care treatment plan discussed For now patient wants everything done which includes CPR, intubation ventilator if the need arises CODE STATUS: Full code Time spent discussing advanced care planning: 16 minutes

## 2018-10-30 NOTE — H&P (Signed)
[facility] NAME: Alexis Greer    MR#:  [id number]  DATE OF BIRTH:  1940-10-12  DATE OF ADMISSION:  10/30/2018  PRIMARY CARE PHYSICIAN: Juluis Pitch, MD   REQUESTING/REFERRING PHYSICIAN:   CHIEF COMPLAINT:   Chief Complaint  Patient presents with  . Hypoglycemia  . Fatigue    HISTORY OF PRESENT ILLNESS: Alexis Greer  is a 78 y.o. female with a known history of breast cancer, chronic kidney disease stage III, type 2 diabetes mellitus, hypertension presented to the emergency room for generalized weakness.  On Monday patient had some nausea vomiting and chills she.  He also had some epigastric right upper quadrant discomfort.  Abdominal pain was aching in nature.  Abdominal pain nausea and vomiting improved.  She was evaluated in the emergency room kidney functions have worsened and liver function tests are elevated.  Renal ultrasound was done which showed medical renal disease but no hydronephrosis or obstruction.  Abdominal ultrasound showed gallbladder sludge and tiny calculi.  Hospitalist service was consulted for further care.  COVID-19 test negative.  PAST MEDICAL HISTORY:   Past Medical History:  Diagnosis Date  . Arthritis   . Asthma   . Breast cancer ([place]) 11/17/14   right breast lumpectomy, triple negative, with mammosite tx. Chemo with held due to CVA after lumpectomy  . Chronic kidney disease   . Complication of anesthesia    had a stroke during the breast lumpectomy  . Diabetes mellitus without complication ([place])   . Dyspnea   . Hyperlipidemia   . Hypertension   . Personal history of radiation therapy 2016    mammosite radiation-RIGHT lumpectomy  . Stroke [facility]) 2016   post lumpectomy    PAST SURGICAL HISTORY:  Past Surgical History:  Procedure Laterality Date  . ABDOMINAL HYSTERECTOMY  1979  . BREAST BIOPSY Right 11/17/2014   Invasive mammory carcinoma  . BREAST BIOPSY Right 12/01/2014    Procedure: BREAST BIOPSY WITH NEEDLE LOCALIZATION;  Surgeon: Christene Lye, MD;  Location: [facility] ORS;  Service: General;  Laterality: Right;  . BREAST LUMPECTOMY Right 2016   INVASIVE MAMMARY CARCINOMA   . BREAST LUMPECTOMY WITH SENTINEL LYMPH NODE BIOPSY Right 12/01/2014   Procedure: BREAST LUMPECTOMY WITH SENTINEL LYMPH NODE BX;  Surgeon: Christene Lye, MD;  Location: [facility] ORS;  Service: General;  Laterality: Right;  . COLONOSCOPY WITH PROPOFOL N/A 06/13/2016   Procedure: COLONOSCOPY WITH PROPOFOL;  Surgeon: Lollie Sails, MD;  Location: [facility] ENDOSCOPY;  Service: Endoscopy;  Laterality: N/A;  . LOWER EXTREMITY INTERVENTION Left 11/09/2016   Procedure: LOWER EXTREMITY INTERVENTION;  Surgeon: Algernon Huxley, MD;  Location: [place] CV LAB;  Service: Cardiovascular;  Laterality: Left;  . OOPHORECTOMY  2014  . TONSILLECTOMY      SOCIAL HISTORY:  Social History   Tobacco Use  . Smoking status: Former Smoker    Quit date: 08/25/1993    Years since quitting: 25.1  . Smokeless tobacco: Never Used  Substance Use Topics  . Alcohol use: No    Alcohol/week: 0.0 standard drinks    FAMILY HISTORY:  Family History  Problem Relation Age of Onset  . Heart disease Mother   . Lung cancer Sister        smoker  . CAD Father   . Hyperlipidemia Father     DRUG ALLERGIES:  Allergies  Allergen Reactions  . Oxycodone Other (See Comments)    It made the patient have  shakes and jittery     REVIEW OF SYSTEMS:   CONSTITUTIONAL: No fever, has fatigue and weakness.  EYES: No blurred or double vision.  EARS, NOSE, AND THROAT: No tinnitus or ear pain.  RESPIRATORY: No cough, shortness of breath, wheezing or hemoptysis.  CARDIOVASCULAR: No chest pain, orthopnea, edema.  GASTROINTESTINAL: Had nausea, vomiting,  abdominal pain.  No diarrhea GENITOURINARY: No dysuria, hematuria.  ENDOCRINE: No polyuria, nocturia,  HEMATOLOGY: No anemia, easy bruising or bleeding SKIN: No rash or  lesion. MUSCULOSKELETAL: No joint pain or arthritis.   NEUROLOGIC: No tingling, numbness, weakness.  PSYCHIATRY: No anxiety or depression.   MEDICATIONS AT HOME:  Prior to Admission medications   Medication Sig Start Date End Date Taking? Authorizing Provider  ACCU-CHEK AVIVA PLUS test strip  10/11/16   [provider]  acetaminophen (TYLENOL) 500 MG tablet Take 1,000 mg by mouth every 6 (six) hours as needed for mild pain or headache.    [provider]  allopurinol (ZYLOPRIM) 100 MG tablet Take 100 mg by mouth 2 (two) times daily.  09/03/14   [provider]  amLODipine (NORVASC) 10 MG tablet Take 10 mg by mouth at bedtime.  09/03/14   [provider]  aspirin EC 81 MG tablet Take 81 mg by mouth daily.    [provider]  atorvastatin (LIPITOR) 40 MG tablet Take 40 mg by mouth daily at 6 PM.    [provider]  carvedilol (COREG) 25 MG tablet Take 25 mg by mouth daily.    [provider]  chlorthalidone (HYGROTON) 25 MG tablet Take 25 mg by mouth daily.  11/11/14 06/28/17  [provider]  Cholecalciferol (D-5000) 5000 UNITS TABS Take 5,000 Units by mouth daily.     [provider]  ferrous sulfate 325 (65 FE) MG tablet Take 325 mg by mouth daily.    [provider]  glimepiride (AMARYL) 4 MG tablet Take 4 mg by mouth daily.  05/25/14   [provider]  Insulin Isophane & Regular Human (NOVOLIN 70/30 FLEXPEN) (70-30) 100 UNIT/ML PEN Inject into the skin.    [provider]  Menthol-Methyl Salicylate (MUSCLE RUB) 10-15 % CREA Apply 1 application topically as needed for muscle pain.    [provider]  Multiple Vitamin (MULTI-VITAMINS) TABS Take 1 tablet by mouth daily.     [provider]  omeprazole (PRILOSEC) 20 MG capsule Take 40 mg by mouth daily. Takes 2 tablets daily 10/21/13   [provider]  valsartan (DIOVAN) 160 MG tablet Take 160 mg by mouth daily.     [provider]  vitamin B-12 (CYANOCOBALAMIN) 1000 MCG tablet Take 1,000 mcg by mouth daily.    [provider]  vitamin E 400 UNIT capsule Take 400 Units by mouth daily.  04/07/15   [provider]      PHYSICAL EXAMINATION:   VITAL SIGNS: Blood pressure (!) 112/57, pulse 85, temperature 99.9 F (37.7 C), temperature source Oral, resp. rate 18, height 5\' 2"  (1.575 m), weight 106.6 kg, SpO2 97 %.  GENERAL:  78 y.o.-year-old patient lying in the bed with no acute distress.  EYES: Pupils equal, round, reactive to light and accommodation. No scleral icterus. Extraocular muscles intact.  HEENT: Head atraumatic, normocephalic. Oropharynx and nasopharynx clear.  NECK:  Supple, no jugular venous distention. No thyroid enlargement, no tenderness.  LUNGS: Normal breath sounds bilaterally, no wheezing, rales,rhonchi or crepitation. No use of accessory muscles of respiration.  CARDIOVASCULAR: S1, S2  normal. No murmurs, rubs, or gallops.  ABDOMEN: Soft, tenderness epigastrium, nondistended. Bowel sounds present. No organomegaly or mass.  EXTREMITIES: No pedal edema, cyanosis, or clubbing.  NEUROLOGIC: Cranial nerves II through XII are intact. Muscle strength 5/5 in all extremities. Sensation intact. Gait not checked.  PSYCHIATRIC: The patient is alert and oriented x 3.  SKIN: No obvious rash, lesion, or ulcer.   LABORATORY PANEL:   CBC Recent Labs  Lab 10/30/18 0946  WBC 11.9*  HGB 10.2*  HCT 31.2*  PLT 201  MCV 92.9  MCH 30.4  MCHC 32.7  RDW 15.0  LYMPHSABS 0.5*  MONOABS 0.7  EOSABS 0.0  BASOSABS 0.0   ------------------------------------------------------------------------------------------------------------------  Chemistries  Recent Labs  Lab 10/30/18 0946  NA 137  K 4.3  CL 104  CO2 20*  GLUCOSE 90  BUN 50*  CREATININE 3.44*  CALCIUM 9.7  AST 193*  ALT 323*  ALKPHOS 187*  BILITOT 4.0*    ------------------------------------------------------------------------------------------------------------------ estimated creatinine clearance is 15.7 mL/min (A) (by C-G formula based on SCr of 3.44 mg/dL (H)). ------------------------------------------------------------------------------------------------------------------ No results for input(s): TSH, T4TOTAL, T3FREE, THYROIDAB in the last 72 hours.  Invalid input(s): FREET3   Coagulation profile No results for input(s): INR, PROTIME in the last 168 hours. ------------------------------------------------------------------------------------------------------------------- No results for input(s): DDIMER in the last 72 hours. -------------------------------------------------------------------------------------------------------------------  Cardiac Enzymes No results for input(s): CKMB, TROPONINI, MYOGLOBIN in the last 168 hours.  Invalid input(s): CK ------------------------------------------------------------------------------------------------------------------ Invalid input(s): POCBNP  ---------------------------------------------------------------------------------------------------------------  Urinalysis No results found for: COLORURINE, APPEARANCEUR, LABSPEC, PHURINE, GLUCOSEU, HGBUR, BILIRUBINUR, KETONESUR, PROTEINUR, UROBILINOGEN, NITRITE, LEUKOCYTESUR   RADIOLOGY: US Renal  Result Date: 10/30/2018 CLINICAL DATA:  Renal failure, diabetes mellitus, hypertension EXAM: RENAL / URINARY TRACT ULTRASOUND COMPLETE COMPARISON:  CT abdomen and pelvis 07/30/2012 FINDINGS: Right Kidney: Renal measurements: 10.3 x 4.4 x 5.2 cm = volume: 124 mL. Normal cortical thickness. Increased cortical echogenicity. No hydronephrosis or shadowing calcification. Hypoechoic nodule at inferior pole 20 x 16 x 18 mm question cyst, measured 14 mm diameter on prior CT. Additional small hypoechoic nodule at upper to mid RIGHT kidney 17 x 13 x 16 mm, also  containing a few scattered internal echoes, question additional cyst. No septations or mural nodularity within the observed cysts. Left Kidney: Renal measurements: 9.4 x 5.7 x 4.5 cm = volume: 125 mL. Cortical thinning period increased cortical echogenicity. No hydronephrosis or shadowing calcification. Small mid renal cyst 21 x 19 x 22 mm. Additional small mid renal cyst 15 x 14 x 12 mm. Both of these of increased in sizes since 2014 CT. No mural nodularity or septations within the observed cysts. Bladder: Appears normal for degree of bladder distention. IMPRESSION: Medical renal disease changes of both kidneys. BILATERAL renal cysts, all of which contain a few scattered internal echoes, suspect artifacts related to body habitus. Electronically Signed   By: Lavonia Dana M.D.   On: 10/30/2018 12:47   US Abdomen Limited Ruq  Result Date: 10/30/2018 CLINICAL DATA:  Transaminitis, hyperbilirubinemia, epigastric pain, reflux, history breast cancer, diabetes mellitus, hypertension EXAM: ULTRASOUND ABDOMEN LIMITED RIGHT UPPER QUADRANT COMPARISON:  CT abdomen and pelvis 07/30/2012 FINDINGS: Gallbladder: Normally distended. Dependent echogenic material with suspect mild posterior shadowing on transverse imaging, question tiny dependent calculi versus less likely sludge. No gallbladder wall thickening, pericholecystic fluid or sonographic Murphy sign. Common bile duct: Diameter: 3 mm, normal Liver: Echogenic parenchyma, likely fatty infiltration though this can be seen with cirrhosis and certain infiltrative disorders. No focal hepatic mass or nodularity. Portal vein  is patent on color Doppler imaging with normal direction of blood flow towards the liver. Other: No RIGHT upper quadrant free fluid. IMPRESSION: Probable fatty infiltration of liver as above. Dependent echogenic material within the gallbladder, with a suspicion of posterior shadowing on some of the transverse images, question tiny dependent calculi versus less  likely sludge. No sonographic evidence of acute cholecystitis or biliary obstruction. Electronically Signed   By: Lavonia Dana M.D.   On: 10/30/2018 12:44    EKG: Orders placed or performed during the hospital encounter of 11/26/14  . EKG 12-Lead  . EKG 12-Lead    IMPRESSION AND PLAN: 78 year old female patient with a known history of breast cancer, chronic kidney disease stage III, type 2 diabetes mellitus, hypertension presented to the emergency room for generalized weakness.  Patient's blood sugar was low when she presented to the emergency room.  -Acute on chronic kidney disease stage III Hold nephrotoxic meds IV fluids Monitor renal function  -Hypoglycemia Hold diabetic meds IV fluids Monitor blood sugars closely  -Abnormal LFTs Abdominal ultrasound reviewed shows gallbladder wall thickening and sludge Surgery and gastroenterology consult Repeat LFTs in a.m. Clear liquid diet  -DVT prophylaxis subcu heparin All the records are reviewed and case discussed with ED provider. Management plans discussed with the patient, family and they are in agreement.  CODE STATUS: Full code Code Status History    Date Active Date Inactive Code Status Order ID Comments User Context   12/01/2014 1405 12/02/2014 2036 Full Code [id number]  Loletha Grayer, MD Inpatient   Advance Care Planning Activity       TOTAL TIME TAKING CARE OF THIS PATIENT: 52 minutes.    Saundra Shelling M.D on 10/30/2018 at 1:37 PM  Between 7am to 6pm - Pager - [phone]  After 6pm go to [email or web address] - password [email or web address] [facility]  Office  [phone]  CC: Primary care physician; Juluis Pitch, MD

## 2018-10-30 NOTE — ED Provider Notes (Signed)
[facility] Emergency Department Provider Note       Time seen: ----------------------------------------- 9:46 AM on 10/30/2018 -----------------------------------------   I have reviewed the triage vital signs and the nursing notes.  HISTORY   Chief Complaint Hypoglycemia and Fatigue   HPI Alexis Greer is a 78 y.o. female with a history of arthritis, asthma, breast cancer, chronic kidney disease, diabetes, hyperlipidemia, hypertension who presents to the ED for low blood sugar since last night.  Patient reports her blood sugars been in the 83s.  She has been fatigued and had a fever that started on Monday.  She is concerned about COVID.  She denies any sick contacts, has had some diarrhea but no other symptoms.  Past Medical History:  Diagnosis Date  . Arthritis   . Asthma   . Breast cancer ([place]) 11/17/14   right breast lumpectomy, triple negative, with mammosite tx. Chemo with held due to CVA after lumpectomy  . Chronic kidney disease   . Complication of anesthesia    had a stroke during the breast lumpectomy  . Diabetes mellitus without complication ([place])   . Dyspnea   . Hyperlipidemia   . Hypertension   . Personal history of radiation therapy 2016    mammosite radiation-RIGHT lumpectomy  . Stroke [facility]) 2016   post lumpectomy    Patient Active Problem List   Diagnosis Date Noted  . Hypertension 11/07/2016  . Diabetes mellitus without complication ([place]) 24/82/5003  . Hyperlipidemia 11/07/2016  . 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 CVA (cerebral infarction) 12/01/2014  . 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 ABDOMINAL HYSTERECTOMY  1979  . BREAST BIOPSY Right 11/17/2014   Invasive mammory carcinoma  . BREAST BIOPSY Right 12/01/2014   Procedure: BREAST BIOPSY WITH NEEDLE LOCALIZATION;  Surgeon: Christene Lye, MD;  Location: [facility] ORS;   Service: General;  Laterality: Right;  . BREAST LUMPECTOMY Right 2016   INVASIVE MAMMARY CARCINOMA   . BREAST LUMPECTOMY WITH SENTINEL LYMPH NODE BIOPSY Right 12/01/2014   Procedure: BREAST LUMPECTOMY WITH SENTINEL LYMPH NODE BX;  Surgeon: Christene Lye, MD;  Location: [facility] ORS;  Service: General;  Laterality: Right;  . COLONOSCOPY WITH PROPOFOL N/A 06/13/2016   Procedure: COLONOSCOPY WITH PROPOFOL;  Surgeon: Lollie Sails, MD;  Location: [facility] ENDOSCOPY;  Service: Endoscopy;  Laterality: N/A;  . LOWER EXTREMITY INTERVENTION Left 11/09/2016   Procedure: LOWER EXTREMITY INTERVENTION;  Surgeon: Algernon Huxley, MD;  Location: [place] CV LAB;  Service: Cardiovascular;  Laterality: Left;  . OOPHORECTOMY  2014  . TONSILLECTOMY      Allergies Oxycodone  Social History Social History   Tobacco Use  . Smoking status: Former Smoker    Quit date: 08/25/1993    Years since quitting: 25.1  . Smokeless tobacco: Never Used  Substance Use Topics  . Alcohol use: No    Alcohol/week: 0.0 standard drinks  . Drug use: No   Review of Systems Constitutional: Positive for fever, positive for poor appetite Cardiovascular: Negative for chest pain. Respiratory: Negative for shortness of breath. Gastrointestinal: Negative for abdominal pain, positive for diarrhea Musculoskeletal: Negative for back pain. Skin: Negative for rash. Neurological: Negative for headaches, positive for weakness  All systems negative/normal/unremarkable except as stated in the HPI  ____________________________________________   PHYSICAL EXAM:  VITAL SIGNS: ED Triage Vitals  Enc Vitals Group     BP 10/30/18 0915 (!) 107/58  Pulse Rate 10/30/18 0915 87     Resp 10/30/18 0915 20     Temp 10/30/18 0915 99.9 F (37.7 C)     Temp Source 10/30/18 0915 Oral     SpO2 10/30/18 0915 97 %     Weight 10/30/18 0923 235 lb (106.6 kg)     Height 10/30/18 0923 5\' 2"  (1.575 m)     Head Circumference --      Peak Flow  --      Pain Score 10/30/18 0921 0     Pain Loc --      Pain Edu? --      Excl. in [place]? --    Constitutional: Alert and oriented. Well appearing and in no distress. Eyes: Conjunctivae are normal. Normal extraocular movements. ENT      Head: Normocephalic and atraumatic.      Nose: No congestion/rhinnorhea.      Mouth/Throat: Mucous membranes are moist.      Neck: No stridor. Cardiovascular: Normal rate, regular rhythm. No murmurs, rubs, or gallops. Respiratory: Normal respiratory effort without tachypnea nor retractions. Breath sounds are clear and equal bilaterally. No wheezes/rales/rhonchi. Gastrointestinal: Soft and nontender. Normal bowel sounds Musculoskeletal: Nontender with normal range of motion in extremities. No lower extremity tenderness nor edema. Neurologic:  Normal speech and language. No gross focal neurologic deficits are appreciated.  Skin:  Skin is warm, dry and intact. No rash noted. Psychiatric: Mood and affect are normal. Speech and behavior are normal.   ____________________________________________  ED COURSE:  As part of my medical decision making, I reviewed the following data within the [place] History obtained from family if available, nursing notes, old chart and ekg, as well as notes from prior ED visits. Patient presented for hypoglycemia and weakness with recent fever, we will assess with labs and imaging as indicated at this time.   Procedures  Alexis Greer was evaluated in Emergency Department on 10/30/2018 for the symptoms described in the history of present illness. She was evaluated in the context of the global COVID-19 pandemic, which necessitated consideration that the patient might be at risk for infection with the SARS-CoV-2 virus that causes COVID-19. Institutional protocols and algorithms that pertain to the evaluation of patients at risk for COVID-19 are in a state of rapid change based on information released by  regulatory bodies including the CDC and federal and state organizations. These policies and algorithms were followed during the patient's care in the ED.  ____________________________________________   LABS (pertinent positives/negatives)  Labs Reviewed  GLUCOSE, CAPILLARY - Abnormal; Notable for the following components:      Result Value   Glucose-Capillary 64 (*)    All other components within normal limits  SARS CORONAVIRUS 2 (HOSPITAL ORDER, [place] [facility])  CBC WITH DIFFERENTIAL/PLATELET  COMPREHENSIVE METABOLIC PANEL  URINALYSIS, COMPLETE (UACMP) WITH MICROSCOPIC  TROPONIN I (HIGH SENSITIVITY)    Radiology: IMPRESSION: Medical renal disease changes of both kidneys.  BILATERAL renal cysts, all of which contain a few scattered internal echoes, suspect artifacts related to body habitus. IMPRESSION: Probable fatty infiltration of liver as above.  Dependent echogenic material within the gallbladder, with a suspicion of posterior shadowing on some of the transverse images, question tiny dependent calculi versus less likely sludge.  No sonographic evidence of acute cholecystitis or biliary obstruction.  ____________________________________________   DIFFERENTIAL DIAGNOSIS   Coronavirus, dehydration, electrolyte abnormality, occult infection, UTI  FINAL ASSESSMENT AND PLAN  Weakness, hypoglycemia, acute on chronic  renal failure, transaminitis   Plan: The patient had presented for weakness and hypoglycemia. Patient's labs revealed numerous abnormalities including acute renal insufficiency superimposed on chronic renal insufficiency. Patient's imaging revealed medical renal disease of both kidneys, fatty infiltration of the liver but no other acute process.  Unclear etiology for these laboratory abnormalities.  Have given her IV fluids.  She will certainly need IV fluids ongoing and admission into the hospital.   Laurence Aly, MD     Note: This note was generated in part or whole with voice recognition software. Voice recognition is usually quite accurate but there are transcription errors that can and very often do occur. I apologize for any typographical errors that were not detected and corrected.     Earleen Newport, MD 10/30/18 1257

## 2018-10-30 NOTE — ED Triage Notes (Signed)
Pt here from [facility] with c/o low blood sugar last night in the 50's, fatigue and a fever that started yesterday, concerned for COVID, however, states she hasn't been around anyone sick that she knows of. NAD at this time.

## 2018-10-30 NOTE — ED Notes (Signed)
Glucose 64 in triage, orange juice given.

## 2018-10-30 NOTE — Plan of Care (Signed)
  Problem: Education: Goal: Knowledge of General Education information will improve Description: Including pain rating scale, medication(s)/side effects and non-pharmacologic comfort measures Outcome: Progressing Pt understand the nature behind her hospitalization.  She has no further questions at the moment. Will continue to  offer support PRN.    Problem: Health Behavior/Discharge Planning: Goal: Ability to manage health-related needs will improve Outcome: Progressing Pt understand her POC and expectations during this hospitalization.    Problem: Clinical Measurements: Goal: Ability to maintain clinical measurements within normal limits will improve Outcome: Progressing Pt's VS WNL.  She has been hypoglycemic this shift.  Pyreddy, MD notified. Will continue to monitor and assess.    Problem: Clinical Measurements: Goal: Will remain free from infection Outcome: Progressing S/Sx of infection monitor and assessed q-shift/ PRN.  Pt has been afebrile thus far.  Will continue to monitor and assess.    Problem: Clinical Measurements: Goal: Respiratory complications will improve Outcome: Progressing Respiratory status monitored and assessed q-shift/ PRN, along with VS.  Pt is on RA with O2 saturations at 99% and respiration rate of 20 breaths per minute.  Pt has not endorsed SOB or DOE. Will continue to monitor and assess.    Problem: Clinical Measurements: Goal: Cardiovascular complication will be avoided Outcome: Progressing Pt's VS WNL.  She has been hypoglycemic this shift.  Pyreddy, MD notified. Will continue to monitor and assess.   Problem: Activity: Goal: Risk for activity intolerance will decrease Outcome: Progressing  Pt is able to turn herself in the bed.  She is x1 assist to the [facility].    Problem: Nutrition: Goal: Adequate nutrition will be maintained Outcome: Progressing Pt is on a clear liquid diet diet.  She was able to tolerate 100% of her dinner this shift.      Problem: Coping: Goal: Level of anxiety will decrease Outcome: Progressing Pt has expressed c/o anxiety r/t her hospitalization. Will continue to offer support PRN.    Problem: Elimination: Goal: Will not experience complications related to bowel motility Outcome: Progressing She does not endorse c/o constipation and she is continent of her bowels.    Problem: Elimination: Goal: Will not experience complications related to urinary retention Outcome: Progressing Pt is x1 to [facility].   Problem: Pain Management: Goal: General experience of comfort will improve Outcome: Progressing Pt has denied pain thus far.  Problem: Safety: Goal: Ability to remain free from injury will improve Outcome: Progressing Instructed pt to utilize RN call light for assistance.  Hourly rounds performed.  Bed alarm implemented to keep pt safe from falls.  Settings set to third most sensitive mode.  Bed in lowest position, locked with two upper/ one lower side rail engaged.  Belongings and call light within reach.   Problem: Skin Integrity: Goal: Risk for impaired skin integrity will decrease Outcome: Progressing Skin impairment monitored and assessed q-shift/ PRN.  Instructed pt to turn q2 hours to prevent further skin impairment.  Tubes and drains assessed for device relate pressure sores.  Will continue to monitor and assess.

## 2018-10-30 NOTE — Consult Note (Signed)
GI Inpatient Consult Note  Reason for Consult: Elevated LFTs   Attending Requesting Consult: Dr. Saundra Shelling, MD  History of Present Illness: Alexis Greer is a 78 y.o. female seen for evaluation of elevated LFTs at the request of Dr. Estanislado Pandy. Pt has a PMH of Hx of breast cancer, CKD Stage III, T2DM, HTN. She presented to the ED today for low-grade fever, nausea, vomiting, and chills. She describes epigastric/RUQ abd pain on Monday that came on suddenly and lasted all day. She describes the pain as sharp and acing in nature. Pain did not radiate. She reports two episodes of non-bilious and non-bloody emesis on Monday afternoon. Pain was not worsened by oral intake. She reports she has not had much of an appetite since Monday. She reports she has had pain like this before - one year ago- but attributed it to acid reflux. She reports she has been using Mylanta without any relief. She does not take any anti-secretory therapy for GERD. She was found to have acute renal failure with BUN 50 and Cr 3.44. Labs were also significant for WBC 11.9, AST 193, ALT 323, alk phos 187, total bilirubin 4.0. She had RUQ US showed suspected tiny dependent calculi vs sludge with no gallbladder wall thickening or  pericholecystic fluid. No extrahepatic biliary obstruction. CBD was normal in diameter at 3 mm. Patient reports she was in no abdominal pain yesterday or today. She does endorse some mild nausea without emesis since Monday. She reports she had a fever of 100.2 at home this morning. Her Covid-19 test was negative in the ED. No recent sick contacts or recent travel. She denies any frequent NSAID use.    Last Colonoscopy:  05/2016 by Dr. Gustavo Lah - Preparation of the colon was fair. - Diverticulosis in the sigmoid colon and in the distal descending colon. - Two 2 to 3 mm polyps in the descending colon, removed with a cold biopsy forceps. Resected and retrieved. - One 6 mm polyp in the descending colon,  removed with a cold snare. Resected and retrieved. - One 2 mm polyp in the proximal sigmoid colon, removed with a cold biopsy forceps. Resected and retrieved. - External and internal hemorrhoids.  Last Endoscopy: N/A   Past Medical History:  Past Medical History:  Diagnosis Date  . Arthritis   . Asthma   . Breast cancer ([place]) 11/17/14   right breast lumpectomy, triple negative, with mammosite tx. Chemo with held due to CVA after lumpectomy  . Chronic kidney disease   . Complication of anesthesia    had a stroke during the breast lumpectomy  . Diabetes mellitus without complication ([place])   . Dyspnea   . Hyperlipidemia   . Hypertension   . Personal history of radiation therapy 2016    mammosite radiation-RIGHT lumpectomy  . Stroke [facility]) 2016   post lumpectomy    Problem List: Patient Active Problem List   Diagnosis Date Noted  . ARF (acute renal failure) ([place]) 10/30/2018  . Hypertension 11/07/2016  . Diabetes mellitus without complication ([place]) 72/53/6644  . Hyperlipidemia 11/07/2016  . 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 CVA (cerebral infarction) 12/01/2014  . 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 ABDOMINAL HYSTERECTOMY  1979  . BREAST BIOPSY Right 11/17/2014   Invasive mammory carcinoma  . BREAST BIOPSY Right 12/01/2014   Procedure: BREAST BIOPSY WITH NEEDLE LOCALIZATION;  Surgeon: Christene Lye,  MD;  Location: [facility] ORS;  Service: General;  Laterality: Right;  . BREAST LUMPECTOMY Right 2016   INVASIVE MAMMARY CARCINOMA   . BREAST LUMPECTOMY WITH SENTINEL LYMPH NODE BIOPSY Right 12/01/2014   Procedure: BREAST LUMPECTOMY WITH SENTINEL LYMPH NODE BX;  Surgeon: Christene Lye, MD;  Location: [facility] ORS;  Service: General;  Laterality: Right;  . COLONOSCOPY WITH PROPOFOL N/A 06/13/2016   Procedure: COLONOSCOPY WITH PROPOFOL;  Surgeon: Lollie Sails, MD;  Location: [facility] ENDOSCOPY;  Service: Endoscopy;  Laterality: N/A;  . LOWER EXTREMITY INTERVENTION Left 11/09/2016   Procedure: LOWER EXTREMITY INTERVENTION;  Surgeon: Algernon Huxley, MD;  Location: [facility] LAB;  Service: Cardiovascular;  Laterality: Left;  . OOPHORECTOMY  2014  . TONSILLECTOMY      Allergies: Allergies  Allergen Reactions  . Oxycodone Other (See Comments)    It made the patient have shakes and jittery     Home Medications: (Not in a hospital admission)  Home medication reconciliation was completed with the patient.   Scheduled Inpatient Medications:   . ondansetron (ZOFRAN) IV  4 mg Intravenous Once    Continuous Inpatient Infusions:    PRN Inpatient Medications:    Family History: family history includes CAD in her father; Heart disease in her mother; Hyperlipidemia in her father; Lung cancer in her sister.  The patient's family history is negative for inflammatory bowel disorders, GI malignancy, or solid organ transplantation.  Social History:   reports that she quit smoking about 25 years ago. She has never used smokeless tobacco. She reports that she does not drink alcohol or use drugs. The patient denies ETOH, tobacco, or drug use.   Review of Systems: Constitutional: Weight is stable.  Eyes: No changes in vision. ENT: No oral lesions, sore throat.  GI: see HPI.  Heme/Lymph: No easy bruising.  CV: No chest pain.  GU: No hematuria.  Integumentary: No rashes.  Neuro: No headaches.  Psych: No depression/anxiety.  Endocrine: No heat/cold intolerance.  Allergic/Immunologic: No urticaria.  Resp: No cough, SOB.  Musculoskeletal: No joint swelling.    Physical Examination: BP 122/69   Pulse 85   Temp 99.9 F (37.7 C) (Oral)   Resp 18   Ht '5\' 2"'  (1.575 m)   Wt 106.6 kg   SpO2 96%   BMI 42.98 kg/m  Gen: NAD, alert and oriented x 4 HEENT: PEERLA, EOMI, sclera anicteric Neck: supple, no JVD or thyromegaly Chest: CTA  bilaterally, no wheezes, crackles, or other adventitious sounds CV: RRR, no m/g/c/r Abd: soft, obese abdomen, ND, +BS in all four quadrants; no HSM, guarding, ridigity, or rebound tenderness, negative Murphy's sign Ext: no edema, well perfused with 2+ pulses, Skin: no rash or lesions noted Lymph: no LAD  Data: Lab Results  Component Value Date   WBC 11.9 (H) 10/30/2018   HGB 10.2 (L) 10/30/2018   HCT 31.2 (L) 10/30/2018   MCV 92.9 10/30/2018   PLT 201 10/30/2018   Recent Labs  Lab 10/30/18 0946  HGB 10.2*   Lab Results  Component Value Date   NA 137 10/30/2018   K 4.3 10/30/2018   CL 104 10/30/2018   CO2 20 (L) 10/30/2018   BUN 50 (H) 10/30/2018   CREATININE 3.44 (H) 10/30/2018   Lab Results  Component Value Date   ALT 323 (H) 10/30/2018   AST 193 (H) 10/30/2018   ALKPHOS 187 (H) 10/30/2018   BILITOT 4.0 (H) 10/30/2018   No results for input(s): APTT, INR, PTT  in the last 168 hours.   Colon 05/2016:  DIAGNOSIS:  A. COLON POLYPS X 2, DESCENDING; COLD BIOPSY:  - TUBULAR ADENOMAS, 2 FRAGMENTS.  - NEGATIVE FOR HIGH-GRADE DYSPLASIA AND MALIGNANCY.   B. COLON POLYP, DESCENDING; COLD SNARE:  - TUBULAR ADENOMA.  - NEGATIVE FOR HIGH-GRADE DYSPLASIA AND MALIGNANCY.   C. COLON POLYP, PROXIMAL SIGMOID; COLD BIOPSY:  - TWO TINY PARTLY CRUSHED MUCOSAL FRAGMENTS ADMIXED WITH FECAL MATERIAL.  - NEGATIVE FOR DYSPLASIA (ADDITIONAL DEEPER SECTIONS REVIEWED).    Assessment/Plan:  78 y/o AA female with a PMH of Hx of breast cancer, CKD Stage III, T2DM, HTN admitted for hypoglycemia, weakness, and elevated LFTs  1. Elevated LFTs 2. RUQ/Epigastric abd pain - all day on Monday, no current abd pain 3. Non-intractable nausea and vomiting 4. Acute renal failure - likely 2/2 dehydration, poor PO intake  - Labs in the ED showed AST 193, ALT 323, alk phos 187, total bilirubin 4.0 - Pattern of LFTs are suspicious for cholestasis, including extrahepatic biliary obstruction with  choledocholithiasis, cholangitis, bile duct tumor. DDx also includes esophagitis, gastritis, PUD, PBC, PSC, viral hepatitis, drugs/toxins - Need to check hepatic function panel to differentiate bilirubin - Recommend proceeding with MRCP for further evaluation of biliary tree to rule out stones - If MRCP is positive, consider ERCP - If MRCP is negative, will need further hepatic serology w/u. Will continue to follow LFTs - Continue care per hospital team - We will continue to follow - I have spoken to patient's daughter - Jackelyn Poling and updated her on the POC   Thank you for the consult. Please call with questions or concerns.  Reeves Forth [facility] [phone] (Cell)

## 2018-10-30 NOTE — Significant Event (Signed)
Pt admitted from the ED RUQ epigastric pain.  Once she arrived to the unit her BSBG was obtained.  Her BSBG results were 51 per POC.  Notified Pyreddy, MD.  Hanley Seamen pt 2 grape juices.  Rechecked her BSBG 30 minutes late and it was 90.  Notified Pyreddy, MD and he stated to monitor pt closely.

## 2018-10-30 NOTE — ED Notes (Addendum)
ED TO INPATIENT HANDOFF REPORT  ED Nurse Name and Phone #:  Elmo Putt #[phone]  S Name/Age/Gender Alexis Greer 78 y.o. female Room/Bed: ED13A/ED13A  Code Status   Code Status: Prior  Home/SNF/Other Home Patient oriented to: self, place, time and situation Is this baseline? Yes   Triage Complete: Triage complete  Chief Complaint brought by kc slurred speech  Triage Note Pt here from [facility] with c/o low blood sugar last night in the 50's, fatigue and a fever that started yesterday, concerned for COVID, however, states she hasn't been around anyone sick that she knows of. NAD at this time.    Allergies Allergies  Allergen Reactions  . Oxycodone Other (See Comments)    It made the patient have shakes and jittery     Level of Care/Admitting Diagnosis ED Disposition    ED Disposition Condition [place]: [place] [[id number]]  Level of Care: Med-Surg [16]  Covid Evaluation: Confirmed COVID Negative  Diagnosis: ARF (acute renal failure) [facility]) [[id number]]  Admitting Physician: Saundra Shelling [[id number]]  Attending Physician: Saundra Shelling [[id number]]  Estimated length of stay: past midnight tomorrow  Certification:: I certify this patient will need inpatient services for at least 2 midnights  PT Class (Do Not Modify): Inpatient [101]  PT Acc Code (Do Not Modify): Private [1]       B Medical/Surgery History Past Medical History:  Diagnosis Date  . Arthritis   . Asthma   . Breast cancer ([place]) 11/17/14   right breast lumpectomy, triple negative, with mammosite tx. Chemo with held due to CVA after lumpectomy  . Chronic kidney disease   . Complication of anesthesia    had a stroke during the breast lumpectomy  . Diabetes mellitus without complication ([place])   . Dyspnea   . Hyperlipidemia   . Hypertension   . Personal history of radiation therapy 2016    mammosite radiation-RIGHT lumpectomy  . Stroke [facility]) 2016   post lumpectomy    Past Surgical History:  Procedure Laterality Date  . ABDOMINAL HYSTERECTOMY  1979  . BREAST BIOPSY Right 11/17/2014   Invasive mammory carcinoma  . BREAST BIOPSY Right 12/01/2014   Procedure: BREAST BIOPSY WITH NEEDLE LOCALIZATION;  Surgeon: Christene Lye, MD;  Location: [facility] ORS;  Service: General;  Laterality: Right;  . BREAST LUMPECTOMY Right 2016   INVASIVE MAMMARY CARCINOMA   . BREAST LUMPECTOMY WITH SENTINEL LYMPH NODE BIOPSY Right 12/01/2014   Procedure: BREAST LUMPECTOMY WITH SENTINEL LYMPH NODE BX;  Surgeon: Christene Lye, MD;  Location: [facility] ORS;  Service: General;  Laterality: Right;  . COLONOSCOPY WITH PROPOFOL N/A 06/13/2016   Procedure: COLONOSCOPY WITH PROPOFOL;  Surgeon: Lollie Sails, MD;  Location: [facility] ENDOSCOPY;  Service: Endoscopy;  Laterality: N/A;  . LOWER EXTREMITY INTERVENTION Left 11/09/2016   Procedure: LOWER EXTREMITY INTERVENTION;  Surgeon: Algernon Huxley, MD;  Location: [place] CV LAB;  Service: Cardiovascular;  Laterality: Left;  . OOPHORECTOMY  2014  . TONSILLECTOMY       A IV Location/Drains/Wounds Patient Lines/Drains/Airways Status   Active Line/Drains/Airways    Name:   Placement date:   Placement time:   Site:   Days:   Peripheral IV 10/30/18 Right Antecubital   10/30/18    0956    Antecubital   less than 1          Intake/Output Last 24 hours No intake or output data in the 24 hours ending 10/30/18 1413  Labs/Imaging  Results for orders placed or performed during the hospital encounter of 10/30/18 (from the past 48 hour(s))  Glucose, capillary     Status: Abnormal   Collection Time: 10/30/18  9:23 AM  Result Value Ref Range   Glucose-Capillary 64 (L) 70 - 99 mg/dL  CBC with Differential/Platelet     Status: Abnormal   Collection Time: 10/30/18  9:46 AM  Result Value Ref Range   WBC 11.9 (H) 4.0 - 10.5 K/uL   RBC 3.36 (L) 3.87 - 5.11 MIL/uL   Hemoglobin 10.2 (L) 12.0 - 15.0 g/dL   HCT 31.2 (L) 36.0 - 46.0 %   MCV  92.9 80.0 - 100.0 fL   MCH 30.4 26.0 - 34.0 pg   MCHC 32.7 30.0 - 36.0 g/dL   RDW 15.0 11.5 - 15.5 %   Platelets 201 150 - 400 K/uL   nRBC 0.0 0.0 - 0.2 %   Neutrophils Relative % 88 %   Neutro Abs 10.5 (H) 1.7 - 7.7 K/uL   Lymphocytes Relative 5 %   Lymphs Abs 0.5 (L) 0.7 - 4.0 K/uL   Monocytes Relative 6 %   Monocytes Absolute 0.7 0.1 - 1.0 K/uL   Eosinophils Relative 0 %   Eosinophils Absolute 0.0 0.0 - 0.5 K/uL   Basophils Relative 0 %   Basophils Absolute 0.0 0.0 - 0.1 K/uL   Immature Granulocytes 1 %   Abs Immature Granulocytes 0.09 (H) 0.00 - 0.07 K/uL    Comment: Performed at [facility], [address] [place], [place] [address]  Comprehensive metabolic panel     Status: Abnormal   Collection Time: 10/30/18  9:46 AM  Result Value Ref Range   Sodium 137 135 - 145 mmol/L   Potassium 4.3 3.5 - 5.1 mmol/L   Chloride 104 98 - 111 mmol/L   CO2 20 (L) 22 - 32 mmol/L   Glucose, Bld 90 70 - 99 mg/dL   BUN 50 (H) 8 - 23 mg/dL   Creatinine, Ser 3.44 (H) 0.44 - 1.00 mg/dL   Calcium 9.7 8.9 - 10.3 mg/dL   Total Protein 6.9 6.5 - 8.1 g/dL   Albumin 3.5 3.5 - 5.0 g/dL   AST 193 (H) 15 - 41 U/L   ALT 323 (H) 0 - 44 U/L   Alkaline Phosphatase 187 (H) 38 - 126 U/L   Total Bilirubin 4.0 (H) 0.3 - 1.2 mg/dL   GFR calc non Af Amer 12 (L) >60 mL/min   GFR calc Af Amer 14 (L) >60 mL/min   Anion gap 13 5 - 15    Comment: Performed at [facility], [place], [place] [address]  Troponin I (High Sensitivity)     Status: None   Collection Time: 10/30/18  9:46 AM  Result Value Ref Range   Troponin I (High Sensitivity) 17 <18 ng/L    Comment: (NOTE) Elevated high sensitivity troponin I (hsTnI) values and significant  changes across serial measurements may suggest ACS but many other  chronic and acute conditions are known to elevate hsTnI results.  Refer to the "Links" section for chest pain algorithms and additional  guidance. Performed at [facility], [address] [place], [place] [address]   SARS Coronavirus 2 [facility] order, Performed in [facility] hospital lab) Nasopharyngeal Nasopharyngeal Swab     Status: None   Collection Time: 10/30/18  9:46 AM   Specimen: Nasopharyngeal Swab  Result Value Ref Range   SARS Coronavirus 2  NEGATIVE NEGATIVE    Comment: (NOTE) If result is NEGATIVE SARS-CoV-2 target nucleic acids are NOT DETECTED. The SARS-CoV-2 RNA is generally detectable in upper and lower  respiratory specimens during the acute phase of infection. The lowest  concentration of SARS-CoV-2 viral copies this assay can detect is 250  copies / mL. A negative result does not preclude SARS-CoV-2 infection  and should not be used as the sole basis for treatment or other  patient management decisions.  A negative result may occur with  improper specimen collection / handling, submission of specimen other  than nasopharyngeal swab, presence of viral mutation(s) within the  areas targeted by this assay, and inadequate number of viral copies  (<250 copies / mL). A negative result must be combined with clinical  observations, patient history, and epidemiological information. If result is POSITIVE SARS-CoV-2 target nucleic acids are DETECTED. The SARS-CoV-2 RNA is generally detectable in upper and lower  respiratory specimens dur ing the acute phase of infection.  Positive  results are indicative of active infection with SARS-CoV-2.  Clinical  correlation with patient history and other diagnostic information is  necessary to determine patient infection status.  Positive results do  not rule out bacterial infection or co-infection with other viruses. If result is PRESUMPTIVE POSTIVE SARS-CoV-2 nucleic acids MAY BE PRESENT.   A presumptive positive result was obtained on the submitted specimen  and confirmed on repeat testing.  While 2019 novel coronavirus  (SARS-CoV-2) nucleic acids may be present in the submitted  sample  additional confirmatory testing may be necessary for epidemiological  and / or clinical management purposes  to differentiate between  SARS-CoV-2 and other Sarbecovirus currently known to infect humans.  If clinically indicated additional testing with an alternate test  methodology [phone]) is advised. The SARS-CoV-2 RNA is generally  detectable in upper and lower respiratory sp ecimens during the acute  phase of infection. The expected result is Negative. Fact Sheet for Patients:  StrictlyIdeas.no Fact Sheet for Healthcare Providers: BankingDealers.co.za This test is not yet approved or cleared by the [place] FDA and has been authorized for detection and/or diagnosis of SARS-CoV-2 by FDA under an Emergency Use Authorization (EUA).  This EUA will remain in effect (meaning this test can be used) for the duration of the COVID-19 declaration under Section 564(b)(1) of the Act, 21 U.S.C. section 360bbb-3(b)(1), unless the authorization is terminated or revoked sooner. Performed at [facility], [address] [place], [place] [address]    US Renal  Result Date: 10/30/2018 CLINICAL DATA:  Renal failure, diabetes mellitus, hypertension EXAM: RENAL / URINARY TRACT ULTRASOUND COMPLETE COMPARISON:  CT abdomen and pelvis 07/30/2012 FINDINGS: Right Kidney: Renal measurements: 10.3 x 4.4 x 5.2 cm = volume: 124 mL. Normal cortical thickness. Increased cortical echogenicity. No hydronephrosis or shadowing calcification. Hypoechoic nodule at inferior pole 20 x 16 x 18 mm question cyst, measured 14 mm diameter on prior CT. Additional small hypoechoic nodule at upper to mid RIGHT kidney 17 x 13 x 16 mm, also containing a few scattered internal echoes, question additional cyst. No septations or mural nodularity within the observed cysts. Left Kidney: Renal measurements: 9.4 x 5.7 x 4.5 cm = volume: 125 mL. Cortical thinning period  increased cortical echogenicity. No hydronephrosis or shadowing calcification. Small mid renal cyst 21 x 19 x 22 mm. Additional small mid renal cyst 15 x 14 x 12 mm. Both of these of increased in sizes since 2014 CT. No mural nodularity or septations within  the observed cysts. Bladder: Appears normal for degree of bladder distention. IMPRESSION: Medical renal disease changes of both kidneys. BILATERAL renal cysts, all of which contain a few scattered internal echoes, suspect artifacts related to body habitus. Electronically Signed   By: Lavonia Dana M.D.   On: 10/30/2018 12:47   US Abdomen Limited Ruq  Result Date: 10/30/2018 CLINICAL DATA:  Transaminitis, hyperbilirubinemia, epigastric pain, reflux, history breast cancer, diabetes mellitus, hypertension EXAM: ULTRASOUND ABDOMEN LIMITED RIGHT UPPER QUADRANT COMPARISON:  CT abdomen and pelvis 07/30/2012 FINDINGS: Gallbladder: Normally distended. Dependent echogenic material with suspect mild posterior shadowing on transverse imaging, question tiny dependent calculi versus less likely sludge. No gallbladder wall thickening, pericholecystic fluid or sonographic Murphy sign. Common bile duct: Diameter: 3 mm, normal Liver: Echogenic parenchyma, likely fatty infiltration though this can be seen with cirrhosis and certain infiltrative disorders. No focal hepatic mass or nodularity. Portal vein is patent on color Doppler imaging with normal direction of blood flow towards the liver. Other: No RIGHT upper quadrant free fluid. IMPRESSION: Probable fatty infiltration of liver as above. Dependent echogenic material within the gallbladder, with a suspicion of posterior shadowing on some of the transverse images, question tiny dependent calculi versus less likely sludge. No sonographic evidence of acute cholecystitis or biliary obstruction. Electronically Signed   By: Lavonia Dana M.D.   On: 10/30/2018 12:44    Pending Labs Unresulted Labs (From admission, onward)    Start      Ordered   10/31/18 0500  Hepatic function panel  Tomorrow morning,   STAT     10/30/18 1313   10/30/18 0944  Urinalysis, Complete w Microscopic  Once,   STAT     10/30/18 0944   Signed and Held  CBC  (heparin)  Once,   R    Comments: Baseline for heparin therapy IF NOT ALREADY DRAWN.  Notify MD if PLT < 100 K.    Signed and Held   Signed and Held  Creatinine, serum  (heparin)  Once,   R    Comments: Baseline for heparin therapy IF NOT ALREADY DRAWN.    Signed and Held   Signed and Held  Basic metabolic panel  Tomorrow morning,   R     Signed and Held   Signed and Held  CBC  Tomorrow morning,   R     Signed and Held          Vitals/Pain Today's Vitals   10/30/18 1100 10/30/18 1130 10/30/18 1200 10/30/18 1407  BP: (!) 112/57 110/61 122/69 (!) 114/96  Pulse: 85 80 85 94  Resp: 18  18 18   Temp:      TempSrc:      SpO2: 97% 90% 96% 95%  Weight:      Height:      PainSc:        Isolation Precautions No active isolations  Medications Medications  0.9 %  sodium chloride infusion ( Intravenous New Bag/Given 10/30/18 1407)    Mobility walks Low fall risk   Focused Assessments Cardiac Assessment Handoff:    No results found for: CKTOTAL, CKMB, CKMBINDEX, TROPONINI No results found for: DDIMER Does the Patient currently have chest pain? No   , Pulmonary Assessment Handoff:  Lung sounds:  clear bil, dyspnea on exertion, none at rest O2 Device: Room Air   Pt is Jehovah's Witness.   R Recommendations: See Admitting Provider Note  Report given to:   Additional Notes:

## 2018-10-30 NOTE — Progress Notes (Signed)
Pt admitted from the ED for RUQ epigastric pain.  Oriented pt to the unit and how to utilize the call light.  Reviewed POC and expectations of her hospitalization.  Pt denies pain and is resting comfortably.  Will continue to monitor and assess.

## 2018-10-30 NOTE — ED Notes (Signed)
Pt a/o. No s/s of distress. No complaints. Pt states she takes 70/30 insulin - took her normal dose but did not eat breakfast.

## 2018-10-30 NOTE — Consult Note (Signed)
[facility] SURGICAL CONSULTATION NOTE (initial) - cpt: 03546   HISTORY OF PRESENT ILLNESS (HPI):  78 y.o. female presented to [facility] ED today for evaluation of generalized weakness. Patient reports that on Monday morning, she noticed epigastric crampy and aching abdominal pain which did not radiate anywhere. At that time, she also noticed nausea and emesis. The pain and symptoms resolved but she overall felt weak the last 3 days. This morning she reports a subjective and fever and chills. No more abdominal pain and she presented to the ED "because her daughter made her." She denied any cough, congestion, CP, SOB, bladder/bowel changes, itchiness, or icterus. She notes a remote history of similar pain in the past. No previous abdominal surgeries. Work up in the ED revealed a mild leukocytosis, acute on chronic renal failure, transaminitis and hyperbilirubinemia. RUQ US  Showed gallbladder sludge vs calculi but no evidence of cholecystitis. She was admitted to the medicine service.   Surgery is consulted by hospitalist physician Dr. Estanislado Pandy, MD in this context for evaluation and management of elevated LFTs, hyperbilirubinemia, and question of gallbladder sludge vs calculi.   PAST MEDICAL HISTORY (PMH):  Past Medical History:  Diagnosis Date  . Arthritis   . Asthma   . Breast cancer ([place]) 11/17/14   right breast lumpectomy, triple negative, with mammosite tx. Chemo with held due to CVA after lumpectomy  . Chronic kidney disease   . Complication of anesthesia    had a stroke during the breast lumpectomy  . Diabetes mellitus without complication ([place])   . Dyspnea   . Hyperlipidemia   . Hypertension   . Personal history of radiation therapy 2016    mammosite radiation-RIGHT lumpectomy  . Stroke [facility]) 2016   post lumpectomy     PAST SURGICAL HISTORY ([place]):  Past Surgical History:  Procedure Laterality Date  . ABDOMINAL HYSTERECTOMY  1979  . BREAST BIOPSY Right 11/17/2014   Invasive mammory carcinoma  . BREAST BIOPSY Right 12/01/2014   Procedure: BREAST BIOPSY WITH NEEDLE LOCALIZATION;  Surgeon: Christene Lye, MD;  Location: [facility] ORS;  Service: General;  Laterality: Right;  . BREAST LUMPECTOMY Right 2016   INVASIVE MAMMARY CARCINOMA   . BREAST LUMPECTOMY WITH SENTINEL LYMPH NODE BIOPSY Right 12/01/2014   Procedure: BREAST LUMPECTOMY WITH SENTINEL LYMPH NODE BX;  Surgeon: Christene Lye, MD;  Location: [facility] ORS;  Service: General;  Laterality: Right;  . COLONOSCOPY WITH PROPOFOL N/A 06/13/2016   Procedure: COLONOSCOPY WITH PROPOFOL;  Surgeon: Lollie Sails, MD;  Location: [facility] ENDOSCOPY;  Service: Endoscopy;  Laterality: N/A;  . LOWER EXTREMITY INTERVENTION Left 11/09/2016   Procedure: LOWER EXTREMITY INTERVENTION;  Surgeon: Algernon Huxley, MD;  Location: [place] CV LAB;  Service: Cardiovascular;  Laterality: Left;  . OOPHORECTOMY  2014  . TONSILLECTOMY       MEDICATIONS:  Prior to Admission medications   Medication Sig Start Date End Date Taking? Authorizing Provider  ACCU-CHEK AVIVA PLUS test strip  10/11/16   [provider]  acetaminophen (TYLENOL) 500 MG tablet Take 1,000 mg by mouth every 6 (six) hours as needed for mild pain or headache.    [provider]  allopurinol (ZYLOPRIM) 100 MG tablet Take 100 mg by mouth 2 (two) times daily.  09/03/14   [provider]  amLODipine (NORVASC) 10 MG tablet Take 10 mg by mouth at bedtime.  09/03/14   [provider]  aspirin EC 81 MG tablet Take 81 mg by mouth daily.  [provider]  atorvastatin (LIPITOR) 40 MG tablet Take 40 mg by mouth daily at 6 PM.    [provider]  carvedilol (COREG) 25 MG tablet Take 25 mg by mouth daily.    [provider]  chlorthalidone (HYGROTON) 25 MG tablet Take 25 mg by mouth daily.  11/11/14 06/28/17  [provider]  Cholecalciferol (D-5000) 5000 UNITS TABS Take 5,000 Units by mouth daily.      [provider]  ferrous sulfate 325 (65 FE) MG tablet Take 325 mg by mouth daily.    [provider]  glimepiride (AMARYL) 4 MG tablet Take 4 mg by mouth daily.  05/25/14   [provider]  Insulin Isophane & Regular Human (NOVOLIN 70/30 FLEXPEN) (70-30) 100 UNIT/ML PEN Inject into the skin.    [provider]  Menthol-Methyl Salicylate (MUSCLE RUB) 10-15 % CREA Apply 1 application topically as needed for muscle pain.    [provider]  Multiple Vitamin (MULTI-VITAMINS) TABS Take 1 tablet by mouth daily.     [provider]  omeprazole (PRILOSEC) 20 MG capsule Take 40 mg by mouth daily. Takes 2 tablets daily 10/21/13   [provider]  valsartan (DIOVAN) 160 MG tablet Take 160 mg by mouth daily.    [provider]  vitamin B-12 (CYANOCOBALAMIN) 1000 MCG tablet Take 1,000 mcg by mouth daily.    [provider]  vitamin E 400 UNIT capsule Take 400 Units by mouth daily.  04/07/15   [provider]     ALLERGIES:  Allergies  Allergen Reactions  . Oxycodone Other (See Comments)    It made the patient have shakes and jittery      SOCIAL HISTORY:  Social History   Socioeconomic History  . Marital status: Divorced    Spouse name: Not on file  . Number of children: Not on file  . Years of education: Not on file  . Highest education level: Not on file  Occupational History  . Not on file  Social Needs  . Financial resource strain: Not on file  . Food insecurity    Worry: Not on file    Inability: Not on file  . Transportation needs    Medical: Not on file    Non-medical: Not on file  Tobacco Use  . Smoking status: Former Smoker    Quit date: 08/25/1993    Years since quitting: 25.1  . Smokeless tobacco: Never Used  Substance and Sexual Activity  . Alcohol use: No    Alcohol/week: 0.0 standard drinks  . Drug use: No  . Sexual activity: Not on file  Lifestyle  . Physical activity    Days per  week: Not on file    Minutes per session: Not on file  . Stress: Not on file  Relationships  . Social Herbalist on phone: Not on file    Gets together: Not on file    Attends religious service: Not on file    Active member of club or organization: Not on file    Attends meetings of clubs or organizations: Not on file    Relationship status: Not on file  . Intimate partner violence    Fear of current or ex partner: Not on file    Emotionally abused: Not on file    Physically abused: Not on file    Forced sexual activity: Not on file  Other Topics Concern  . Not on file  Social  History Narrative  . Not on file     FAMILY HISTORY:  Family History  Problem Relation Age of Onset  . Heart disease Mother   . Lung cancer Sister        smoker  . CAD Father   . Hyperlipidemia Father       REVIEW OF SYSTEMS:  Review of Systems  Constitutional: Positive for chills, fever and malaise/fatigue.  HENT: Negative for congestion and sore throat.   Respiratory: Negative for cough and shortness of breath.   Cardiovascular: Negative for chest pain and palpitations.  Gastrointestinal: Positive for abdominal pain (Resovled), nausea (Resolved) and vomiting (Resolved). Negative for constipation and diarrhea.  Genitourinary: Negative for dysuria and urgency.  Neurological: Negative for dizziness and headaches.  All other systems reviewed and are negative.   VITAL SIGNS:  Temp:  [99.9 F (37.7 C)] 99.9 F (37.7 C) (08/05 0915) Pulse Rate:  [78-94] 94 (08/05 1407) Resp:  [18-20] 18 (08/05 1407) BP: (107-125)/(51-96) 114/96 (08/05 1407) SpO2:  [90 %-100 %] 95 % (08/05 1407) Weight:  [106.6 kg] 106.6 kg (08/05 0923)     Height: 5\' 2"  (157.5 cm) Weight: 106.6 kg BMI (Calculated): 42.97   INTAKE/OUTPUT:  No intake/output data recorded.  PHYSICAL EXAM:  Physical Exam Vitals signs and nursing note reviewed. Exam conducted with a chaperone present.  Constitutional:      General:  She is not in acute distress.    Appearance: Normal appearance. She is obese. She is not ill-appearing.  HENT:     Head: Normocephalic and atraumatic.  Eyes:     General: No scleral icterus.    Conjunctiva/sclera: Conjunctivae normal.  Cardiovascular:     Rate and Rhythm: Normal rate and regular rhythm.     Pulses: Normal pulses.     Heart sounds: No murmur. No friction rub. No gallop.   Pulmonary:     Effort: Pulmonary effort is normal. No respiratory distress.     Breath sounds: No wheezing or rhonchi.  Abdominal:     General: Abdomen is protuberant. There is no distension.     Palpations: Abdomen is soft.     Tenderness: There is no abdominal tenderness. There is no guarding or rebound. Negative signs include Murphy's sign.  Genitourinary:    Comments: Deferred Musculoskeletal: Normal range of motion.     Right lower leg: No edema.     Left lower leg: No edema.  Skin:    General: Skin is warm and dry.     Coloration: Skin is not jaundiced or pale.     Findings: No erythema.  Neurological:     General: No focal deficit present.     Mental Status: She is alert and oriented to person, place, and time.  Psychiatric:        Mood and Affect: Mood normal.        Behavior: Behavior normal.      Labs:  CBC Latest Ref Rng & Units 10/30/2018 12/02/2014 11/26/2014  WBC 4.0 - 10.5 K/uL 11.9(H) 7.2 6.7  Hemoglobin 12.0 - 15.0 g/dL 10.2(L) 10.3(L) 11.7(L)  Hematocrit 36.0 - 46.0 % 31.2(L) 32.2(L) 36.4  Platelets 150 - 400 K/uL 201 286 317   CMP Latest Ref Rng & Units 10/30/2018 12/02/2014 09/18/2012  Glucose 70 - 99 mg/dL 90 164(H) 147(H)  BUN 8 - 23 mg/dL 50(H) 31(H) 35(H)  Creatinine 0.44 - 1.00 mg/dL 3.44(H) 1.85(H) 1.76(H)  Sodium 135 - 145 mmol/L 137 139 141  Potassium 3.5 - 5.1  mmol/L 4.3 4.1 4.6  Chloride 98 - 111 mmol/L 104 111 112(H)  CO2 22 - 32 mmol/L 20(L) 21(L) 20(L)  Calcium 8.9 - 10.3 mg/dL 9.7 9.6 9.7  Total Protein 6.5 - 8.1 g/dL 6.9 - -  Total Bilirubin 0.3 - 1.2  mg/dL 4.0(H) - -  Alkaline Phos 38 - 126 U/L 187(H) - -  AST 15 - 41 U/L 193(H) - -  ALT 0 - 44 U/L 323(H) - -    Imaging studies:   RUQ [place] (10/30/2018) personally reviewed and radiologist report reviewed IMPRESSION: 1) Probable fatty infiltration of liver as above. 2) Dependent echogenic material within the gallbladder, with a suspicion of posterior shadowing on some of the transverse images, question tiny dependent calculi versus less likely sludge. 3) No sonographic evidence of acute cholecystitis or biliary Obstruction.  MRCP (10/30/2018) pending   Assessment/Plan: (ICD-10's: E80.6) 78 y.o. female with mild leukocytosis, resolved epigastric abdominal pain, and hyperbilirubinemia. There is no evidence of cholecysts on imaging although given elevated LFTs and hyperbilirubinemia should work up for possible CBD obstruction.    - Admit to medicine service  - clear liquid diet okay for now; NPO after midnight  - Recommend MRCP to evaluate hyperbilirubinemia; will have to be done w/o contrast given renal function  - Agree with GI consult  - will tentatively plan on laparoscopic cholecystectomy with Dr Dahlia Byes tomorrow pending MRCP result and OR/Anesthesia availability    - trend LFTs / leukocytosis  - medical management of comorbidities per primary team; appreciate their assistance  - mobilization encouraged  - DVT prophylaxis; hold in AM for potential procedure  All of the above findings and recommendations were discussed with the patient and her family (daughter), and all of patient's and her family's questions were answered to their expressed satisfaction.  Thank you for the opportunity to participate in this patient's care.   -- Edison Simon, PA-C [facility] 10/30/2018, 2:29 PM [phone] M-F: 7am - 4pm

## 2018-10-31 ENCOUNTER — Encounter
Admission: EM | Disposition: A | Discharge status: Home / Self Care | Payer: Self-pay | Source: Home / Self Care | Attending: Internal Medicine

## 2018-10-31 ENCOUNTER — Inpatient Hospital Stay: Payer: Medicare HMO | Admitting: Anesthesiology

## 2018-10-31 ENCOUNTER — Encounter: Payer: Self-pay | Admitting: *Deleted

## 2018-10-31 DIAGNOSIS — K8050 Calculus of bile duct without cholangitis or cholecystitis without obstruction: Secondary | ICD-10-CM

## 2018-10-31 LAB — GLUCOSE, CAPILLARY
Glucose-Capillary: 116 mg/dL — ABNORMAL HIGH (ref 70–99)
Glucose-Capillary: 202 mg/dL — ABNORMAL HIGH (ref 70–99)
Glucose-Capillary: 204 mg/dL — ABNORMAL HIGH (ref 70–99)
Glucose-Capillary: 278 mg/dL — ABNORMAL HIGH (ref 70–99)
Glucose-Capillary: 68 mg/dL — ABNORMAL LOW (ref 70–99)
Glucose-Capillary: 70 mg/dL (ref 70–99)
Glucose-Capillary: 74 mg/dL (ref 70–99)
Glucose-Capillary: 80 mg/dL (ref 70–99)
Glucose-Capillary: 87 mg/dL (ref 70–99)

## 2018-10-31 LAB — HEPATIC FUNCTION PANEL
ALT: 226 U/L — ABNORMAL HIGH (ref 0–44)
AST: 120 U/L — ABNORMAL HIGH (ref 15–41)
Albumin: 3 g/dL — ABNORMAL LOW (ref 3.5–5.0)
Alkaline Phosphatase: 168 U/L — ABNORMAL HIGH (ref 38–126)
Bilirubin, Direct: 1.3 mg/dL — ABNORMAL HIGH (ref 0.0–0.2)
Indirect Bilirubin: 0.8 mg/dL (ref 0.3–0.9)
Total Bilirubin: 2.1 mg/dL — ABNORMAL HIGH (ref 0.3–1.2)
Total Protein: 6.2 g/dL — ABNORMAL LOW (ref 6.5–8.1)

## 2018-10-31 LAB — CBC
HCT: 29.2 % — ABNORMAL LOW (ref 36.0–46.0)
Hemoglobin: 9.6 g/dL — ABNORMAL LOW (ref 12.0–15.0)
MCH: 30.5 pg (ref 26.0–34.0)
MCHC: 32.9 g/dL (ref 30.0–36.0)
MCV: 92.7 fL (ref 80.0–100.0)
Platelets: 195 10*3/uL (ref 150–400)
RBC: 3.15 MIL/uL — ABNORMAL LOW (ref 3.87–5.11)
RDW: 14.9 % (ref 11.5–15.5)
WBC: 6.3 10*3/uL (ref 4.0–10.5)
nRBC: 0 % (ref 0.0–0.2)

## 2018-10-31 LAB — BASIC METABOLIC PANEL
Anion gap: 9 (ref 5–15)
BUN: 46 mg/dL — ABNORMAL HIGH (ref 8–23)
CO2: 20 mmol/L — ABNORMAL LOW (ref 22–32)
Calcium: 9.1 mg/dL (ref 8.9–10.3)
Chloride: 109 mmol/L (ref 98–111)
Creatinine, Ser: 2.95 mg/dL — ABNORMAL HIGH (ref 0.44–1.00)
GFR calc Af Amer: 17 mL/min — ABNORMAL LOW (ref >60)
GFR calc non Af Amer: 15 mL/min — ABNORMAL LOW (ref >60)
Glucose, Bld: 75 mg/dL (ref 70–99)
Potassium: 4 mmol/L (ref 3.5–5.1)
Sodium: 138 mmol/L (ref 135–145)

## 2018-10-31 LAB — HEPATITIS PANEL, ACUTE
HCV Ab: <0.1 s/co ratio (ref 0.0–0.9)
Hep A IgM: NEGATIVE
Hep B C IgM: NEGATIVE
Hepatitis B Surface Ag: NEGATIVE

## 2018-10-31 LAB — HEMOGLOBIN A1C
Hgb A1c MFr Bld: 6.7 % — ABNORMAL HIGH (ref 4.8–5.6)
Mean Plasma Glucose: 145.59 mg/dL

## 2018-10-31 SURGERY — CHOLECYSTECTOMY, ROBOT-ASSISTED, LAPAROSCOPIC
Anesthesia: General

## 2018-10-31 MED ORDER — KETOROLAC TROMETHAMINE 30 MG/ML IJ SOLN
INTRAMUSCULAR | Status: DC | PRN
  Administered 2018-10-31: 30 mg via INTRAVENOUS

## 2018-10-31 MED ORDER — INSULIN ASPART 100 UNIT/ML ~~LOC~~ SOLN
0.0000 [IU] | Freq: Three times a day (TID) | SUBCUTANEOUS | Status: DC
  Administered 2018-11-01 (×2): 2 [IU] via SUBCUTANEOUS
  Administered 2018-11-01: 08:00:00 3 [IU] via SUBCUTANEOUS
  Filled 2018-10-31 (×3): qty 1

## 2018-10-31 MED ORDER — FENTANYL CITRATE (PF) 100 MCG/2ML IJ SOLN
INTRAMUSCULAR | Status: DC | PRN
  Administered 2018-10-31: 50 ug via INTRAVENOUS

## 2018-10-31 MED ORDER — CEFAZOLIN SODIUM-DEXTROSE 1-4 GM/50ML-% IV SOLN
1.0000 g | INTRAVENOUS | Status: AC
  Administered 2018-10-31: 2 g via INTRAVENOUS
  Filled 2018-10-31: qty 50

## 2018-10-31 MED ORDER — TRAMADOL HCL 50 MG PO TABS: 100.0000 mg | ORAL_TABLET | Freq: Four times a day (QID) | ORAL | Status: DC | PRN

## 2018-10-31 MED ORDER — MORPHINE SULFATE (PF) 4 MG/ML IV SOLN: 2.0000 mg | INTRAVENOUS | Status: DC | PRN

## 2018-10-31 MED ORDER — CARVEDILOL 6.25 MG PO TABS
6.2500 mg | ORAL_TABLET | Freq: Every day | ORAL | Status: DC
  Administered 2018-10-31 – 2018-11-01 (×2): 6.25 mg via ORAL
  Filled 2018-10-31 (×2): qty 1

## 2018-10-31 MED ORDER — LIDOCAINE HCL (CARDIAC) PF 100 MG/5ML IV SOSY
PREFILLED_SYRINGE | INTRAVENOUS | Status: DC | PRN
  Administered 2018-10-31: 100 mg via INTRAVENOUS

## 2018-10-31 MED ORDER — BUPIVACAINE-EPINEPHRINE (PF) 0.25% -1:200000 IJ SOLN
INTRAMUSCULAR | Status: DC | PRN
  Administered 2018-10-31: 30 mL

## 2018-10-31 MED ORDER — CEFAZOLIN SODIUM-DEXTROSE 1-4 GM/50ML-% IV SOLN
INTRAVENOUS | Status: AC
  Filled 2018-10-31: qty 50

## 2018-10-31 MED ORDER — VASOPRESSIN 20 UNIT/ML IV SOLN
INTRAVENOUS | Status: DC | PRN
  Administered 2018-10-31: 1 [IU] via INTRAVENOUS
  Administered 2018-10-31 (×2): 2 [IU] via INTRAVENOUS

## 2018-10-31 MED ORDER — KETOROLAC TROMETHAMINE 30 MG/ML IJ SOLN
30.0000 mg | Freq: Four times a day (QID) | INTRAMUSCULAR | Status: DC
  Administered 2018-10-31: 30 mg via INTRAVENOUS
  Filled 2018-10-31: qty 1

## 2018-10-31 MED ORDER — ACETAMINOPHEN 500 MG PO TABS
1000.0000 mg | ORAL_TABLET | Freq: Four times a day (QID) | ORAL | Status: DC
  Administered 2018-10-31 – 2018-11-02 (×10): 1000 mg via ORAL
  Filled 2018-10-31 (×11): qty 2

## 2018-10-31 MED ORDER — EPHEDRINE SULFATE 50 MG/ML IJ SOLN
INTRAMUSCULAR | Status: DC | PRN
  Administered 2018-10-31 (×2): 10 mg via INTRAVENOUS

## 2018-10-31 MED ORDER — SODIUM CHLORIDE 0.9 % IV SOLN
INTRAVENOUS | Status: DC | PRN
  Administered 2018-10-31: 14:00:00 50 ug/min via INTRAVENOUS

## 2018-10-31 MED ORDER — ROCURONIUM BROMIDE 100 MG/10ML IV SOLN: INTRAVENOUS | Status: DC | PRN

## 2018-10-31 MED ORDER — LACTATED RINGERS IV SOLN
INTRAVENOUS | Status: DC | PRN
  Administered 2018-10-31: 13:00:00 via INTRAVENOUS

## 2018-10-31 MED ORDER — SUGAMMADEX SODIUM 200 MG/2ML IV SOLN
INTRAVENOUS | Status: DC | PRN
  Administered 2018-10-31: 200 mg via INTRAVENOUS

## 2018-10-31 MED ORDER — PHENYLEPHRINE HCL (PRESSORS) 10 MG/ML IV SOLN
INTRAVENOUS | Status: DC | PRN
  Administered 2018-10-31 (×3): 200 ug via INTRAVENOUS
  Administered 2018-10-31 (×2): 100 ug via INTRAVENOUS

## 2018-10-31 MED ORDER — DEXTROSE 50 % IV SOLN
12.5000 g | INTRAVENOUS | Status: AC
  Administered 2018-10-31: 12.5 g via INTRAVENOUS
  Filled 2018-10-31: qty 50

## 2018-10-31 MED ORDER — INDOCYANINE GREEN 25 MG IV SOLR
7.5000 mg | Freq: Once | INTRAVENOUS | Status: DC
  Filled 2018-10-31: qty 25

## 2018-10-31 MED ORDER — FENTANYL CITRATE (PF) 100 MCG/2ML IJ SOLN: 25.0000 ug | INTRAMUSCULAR | Status: DC | PRN

## 2018-10-31 MED ORDER — ROCURONIUM BROMIDE 100 MG/10ML IV SOLN
INTRAVENOUS | Status: DC | PRN
  Administered 2018-10-31: 40 mg via INTRAVENOUS
  Administered 2018-10-31: 10 mg via INTRAVENOUS

## 2018-10-31 MED ORDER — KETOROLAC TROMETHAMINE 30 MG/ML IJ SOLN
15.0000 mg | Freq: Four times a day (QID) | INTRAMUSCULAR | Status: DC
  Administered 2018-10-31 – 2018-11-01 (×2): 15 mg via INTRAVENOUS
  Filled 2018-10-31 (×2): qty 1

## 2018-10-31 MED ORDER — PROPOFOL 10 MG/ML IV BOLUS
INTRAVENOUS | Status: DC | PRN
  Administered 2018-10-31: 130 mg via INTRAVENOUS

## 2018-10-31 MED ORDER — DEXAMETHASONE SODIUM PHOSPHATE 10 MG/ML IJ SOLN
INTRAMUSCULAR | Status: DC | PRN
  Administered 2018-10-31: 10 mg via INTRAVENOUS

## 2018-10-31 MED ORDER — ONDANSETRON HCL 4 MG/2ML IJ SOLN: 4.0000 mg | Freq: Once | INTRAMUSCULAR | Status: DC | PRN

## 2018-10-31 SURGICAL SUPPLY — 49 items
CANISTER SUCT 1200ML W/VALVE (MISCELLANEOUS) ×2 IMPLANT
CHLORAPREP W/TINT 26 (MISCELLANEOUS) ×2 IMPLANT
CLIP VESOLOCK MED LG 6/CT (CLIP) ×2 IMPLANT
COVER LIGHT HANDLE STERIS (MISCELLANEOUS) ×2 IMPLANT
COVER WAND RF STERILE (DRAPES) ×2 IMPLANT
DECANTER SPIKE VIAL GLASS SM (MISCELLANEOUS) ×2 IMPLANT
DEFOGGER SCOPE WARMER CLEARIFY (MISCELLANEOUS) ×2 IMPLANT
DERMABOND ADVANCED (GAUZE/BANDAGES/DRESSINGS) ×1
DERMABOND ADVANCED .7 DNX12 (GAUZE/BANDAGES/DRESSINGS) ×1 IMPLANT
DRAPE 3/4 80X56 (DRAPES) ×2 IMPLANT
DRAPE ARM DVNC X/XI (DISPOSABLE) ×4 IMPLANT
DRAPE COLUMN DVNC XI (DISPOSABLE) ×1 IMPLANT
DRAPE DA VINCI XI ARM (DISPOSABLE) ×4
DRAPE DA VINCI XI COLUMN (DISPOSABLE) ×1
ELECT CAUTERY BLADE 6.4 (BLADE) ×2 IMPLANT
ELECT REM PT RETURN 9FT ADLT (ELECTROSURGICAL) ×2
ELECTRODE REM PT RTRN 9FT ADLT (ELECTROSURGICAL) ×1 IMPLANT
GLOVE BIO SURGEON STRL SZ7 (GLOVE) ×4 IMPLANT
GLOVE BIOGEL PI IND STRL 7.0 (GLOVE) ×1 IMPLANT
GLOVE BIOGEL PI INDICATOR 7.0 (GLOVE) ×1
GLOVE SURG SYN 6.5 ES PF (GLOVE) ×2 IMPLANT
GOWN STRL REUS W/ TWL LRG LVL3 (GOWN DISPOSABLE) ×4 IMPLANT
GOWN STRL REUS W/TWL LRG LVL3 (GOWN DISPOSABLE) ×4
IRRIGATION STRYKERFLOW (MISCELLANEOUS) IMPLANT
IRRIGATOR STRYKERFLOW (MISCELLANEOUS)
IV NS 1000ML (IV SOLUTION)
IV NS 1000ML BAXH (IV SOLUTION) IMPLANT
KIT PINK PAD W/HEAD ARE REST (MISCELLANEOUS) ×2
KIT PINK PAD W/HEAD ARM REST (MISCELLANEOUS) ×1 IMPLANT
LABEL OR SOLS (LABEL) ×2 IMPLANT
NEEDLE HYPO 22GX1.5 SAFETY (NEEDLE) ×2 IMPLANT
NS IRRIG 500ML POUR BTL (IV SOLUTION) ×2 IMPLANT
OBTURATOR OPTICAL STANDARD 8MM (TROCAR) ×1
OBTURATOR OPTICAL STND 8 DVNC (TROCAR) ×1
OBTURATOR OPTICALSTD 8 DVNC (TROCAR) ×1 IMPLANT
PACK LAP CHOLECYSTECTOMY (MISCELLANEOUS) ×2 IMPLANT
PENCIL ELECTRO HAND CTR (MISCELLANEOUS) ×2 IMPLANT
POUCH SPECIMEN RETRIEVAL 10MM (ENDOMECHANICALS) ×2 IMPLANT
SEAL CANN UNIV 5-8 DVNC XI (MISCELLANEOUS) ×4 IMPLANT
SEAL XI 5MM-8MM UNIVERSAL (MISCELLANEOUS) ×4
SOLUTION ELECTROLUBE (MISCELLANEOUS) ×2 IMPLANT
SPONGE LAP 18X18 RF (DISPOSABLE) ×2 IMPLANT
SUT MNCRL 4-0 (SUTURE) ×1
SUT MNCRL 4-0 27XMFL (SUTURE) ×1
SUT MNCRL AB 4-0 PS2 18 (SUTURE) ×2 IMPLANT
SUT VICRYL 0 AB UR-6 (SUTURE) ×4 IMPLANT
SUTURE MNCRL 4-0 27XMF (SUTURE) ×1 IMPLANT
TROCAR 130MM GELPORT  DAV (MISCELLANEOUS) ×2 IMPLANT
TUBING EVAC SMOKE HEATED PNEUM (TUBING) ×2 IMPLANT

## 2018-10-31 NOTE — Transfer of Care (Signed)
Immediate Anesthesia Transfer of Care Note  Patient: Alexis Greer  Procedure(s) Performed: XI ROBOTIC ASSISTED LAPAROSCOPIC CHOLECYSTECTOMY (N/A )  Patient Location: PACU  Anesthesia Type:General  Level of Consciousness: awake and alert   Airway & Oxygen Therapy: Patient Spontanous Breathing and Patient connected to face mask oxygen  Post-op Assessment: Report given to RN, Post -op Vital signs reviewed and stable and Patient moving all extremities X 4  Post vital signs: Reviewed  Last Vitals:  Vitals Value Taken Time  BP 92/47 10/31/18 1515  Temp    Pulse 67 10/31/18 1518  Resp 21 10/31/18 1518  SpO2 100 % 10/31/18 1518  Vitals shown include unvalidated device data.  Last Pain:  Vitals:   10/31/18 1251  TempSrc: Oral  PainSc: 0-No pain         Complications: No apparent anesthesia complications

## 2018-10-31 NOTE — Anesthesia Procedure Notes (Signed)
Procedure Name: Intubation Date/Time: 10/31/2018 1:27 PM Performed by: Philbert Riser, CRNA Pre-anesthesia Checklist: Patient identified, Emergency Drugs available, Suction available, Patient being monitored and Timeout performed Patient Re-evaluated:Patient Re-evaluated prior to induction Oxygen Delivery Method: Circle system utilized and Simple face mask Preoxygenation: Pre-oxygenation with 100% oxygen Induction Type: IV induction Ventilation: Mask ventilation without difficulty Laryngoscope Size: Mac and 3 Grade View: Grade I Tube type: Oral Tube size: 7.0 mm Number of attempts: 1 Airway Equipment and Method: Stylet Placement Confirmation: ETT inserted through vocal cords under direct vision,  positive ETCO2 and breath sounds checked- equal and bilateral Secured at: 21 cm Tube secured with: Tape Dental Injury: Teeth and Oropharynx as per pre-operative assessment

## 2018-10-31 NOTE — Anesthesia Preprocedure Evaluation (Signed)
Anesthesia Evaluation  Patient identified by MRN, date of birth, ID band Patient awake    Reviewed: Allergy & Precautions, NPO status , Patient's Chart, lab work & pertinent test results  History of Anesthesia Complications (+) history of anesthetic complications (CVA with previous anesthesia)  Airway Mallampati: III       Dental   Pulmonary asthma , sleep apnea (hx, used CPAP in past) , neg COPD, Not current smoker, former smoker,           Cardiovascular hypertension, Pt. on medications (-) Past MI and (-) CHF (-) dysrhythmias (-) Valvular Problems/Murmurs     Neuro/Psych neg Seizures CVA (knees bother her still, facial weakness and slurred speech resolved.), Residual Symptoms    GI/Hepatic Neg liver ROS, neg GERD  ,  Endo/Other  diabetes, Type 2, Oral Hypoglycemic Agents  Renal/GU Renal InsufficiencyRenal disease     Musculoskeletal   Abdominal   Peds  Hematology   Anesthesia Other Findings   Reproductive/Obstetrics                             Anesthesia Physical Anesthesia Plan  ASA: III  Anesthesia Plan: General   Post-op Pain Management:    Induction:   PONV Risk Score and Plan: 3 and Dexamethasone, Ondansetron and Midazolam  Airway Management Planned: Oral ETT  Additional Equipment:   Intra-op Plan:   Post-operative Plan:   Informed Consent: I have reviewed the patients History and Physical, chart, labs and discussed the procedure including the risks, benefits and alternatives for the proposed anesthesia with the patient or authorized representative who has indicated his/her understanding and acceptance.       Plan Discussed with:   Anesthesia Plan Comments:         Anesthesia Quick Evaluation

## 2018-10-31 NOTE — Progress Notes (Signed)
Patient was not seen today as she was undergoing cholecystectomy. Events in the chart are reviewed. MRCP images were reviewed. We appreciate General Surgery input and plan for cholecystectomy this afternoon. Please let us know if any GI issues arise.   Octavia Bruckner, PA-C [facility]

## 2018-10-31 NOTE — Progress Notes (Signed)
Hypoglycemic Event  CBG: 68  Pt asymptomatic  Treatment: 1/2 amp D50   Follow-up CBG: Time:0900 CBG Result:116     Lewie Chamber RN

## 2018-10-31 NOTE — Progress Notes (Signed)
Family Meeting Note  Advance Directive:no  Today a meeting took place with the Patient.  The following clinical team members were present during this meeting:MD  The following were discussed:Patient's diagnosis: Abnormal LFTs with abdominal pain concerning for biliary etiology , Patient's progosis: > 12 months and Goals for treatment: Full Code  Additional follow-up to be provided: chaplain consult to create AD  Time spent during discussion:16 minutes  Alexis Costa, MD

## 2018-10-31 NOTE — Anesthesia Post-op Follow-up Note (Signed)
Anesthesia QCDR form completed.        

## 2018-10-31 NOTE — Progress Notes (Signed)
[place] Hospital Day(s): 1.   Post op day(s):  Marland Kitchen   Interval History: Patient seen and examined, no acute events or new complaints overnight. Patient reports she is feeling well this morning. No complaints of fever, chills, nausea, emesis, abdominal pain, or jaundice. Her hyperbilirubinemia continues to improved. MRCP is without evidence of choledocholithiasis. Plan for laparoscopic cholecystectomy this afternoon.    Vital signs in last 24 hours: [min-max] current  Temp:  [98.1 F (36.7 C)-99.4 F (37.4 C)] 98.8 F (37.1 C) (08/06 0544) Pulse Rate:  [78-94] 86 (08/06 0544) Resp:  [17-20] 20 (08/06 0544) BP: (100-128)/(51-96) 105/66 (08/06 0544) SpO2:  [90 %-100 %] 96 % (08/06 0544)     Height: 5\' 2"  (157.5 cm) Weight: 106.6 kg BMI (Calculated): 42.97   Intake/Output last 2 shifts:  08/05 0701 - 08/06 0700 In: 301.8 [P.O.:240; I.V.:61.8] Out: 0    Physical Exam:  Constitutional: alert, cooperative and no distress  HEENT: No scleral icterus Respiratory: breathing non-labored at rest  Gastrointestinal: soft, non-tender, and non-distended Integumentary: warm, dry   Labs:  CBC Latest Ref Rng & Units 10/31/2018 10/30/2018 12/02/2014  WBC 4.0 - 10.5 K/uL 6.3 11.9(H) 7.2  Hemoglobin 12.0 - 15.0 g/dL 9.6(L) 10.2(L) 10.3(L)  Hematocrit 36.0 - 46.0 % 29.2(L) 31.2(L) 32.2(L)  Platelets 150 - 400 K/uL 195 201 286   CMP Latest Ref Rng & Units 10/31/2018 10/30/2018 10/30/2018  Glucose 70 - 99 mg/dL 75 - -  BUN 8 - 23 mg/dL 46(H) - -  Creatinine 0.44 - 1.00 mg/dL 2.95(H) - -  Sodium 135 - 145 mmol/L 138 - -  Potassium 3.5 - 5.1 mmol/L 4.0 - -  Chloride 98 - 111 mmol/L 109 - -  CO2 22 - 32 mmol/L 20(L) - -  Calcium 8.9 - 10.3 mg/dL 9.1 - -  Total Protein 6.5 - 8.1 g/dL 6.2(L) - 6.5  Total Bilirubin 0.3 - 1.2 mg/dL 2.1(H) 3.1(H) 3.0(H)  Alkaline Phos 38 - 126 U/L 168(H) - 181(H)  AST 15 - 41 U/L 120(H) - 159(H)  ALT 0 - 44 U/L 226(H) - 283(H)      Imaging studies:  MRCP (10/30/2018) personally reviewed without evidence of choledocholithiasis and radiologist report reviewed:  IMPRESSION: 1. Normal biliary tree. No choledocholithiasis or duct dilatation. Common bile duct normal. 2. Small sludge within the gallbladder fundus. No evidence acute cholecystitis. 3. Normal liver parenchyma. 4. Normal pancreas. 5. Benign-appearing cysts in the kidneys.  No IV contrast.   Assessment/Plan: (ICD-10's: E80.6) 78 y.o. female with improved hyperbilirubinemia and resolved abdominal pain likely attributable to passed gallstone/sludge   - NPO + IVF  - IV ABx to OR for surgical prophylaxis  - pain control prn  - monitor abdominal examination  - Plan for laparoscopic cholecystectomy with Dr Dahlia Byes this afternoon pending OR/Anesthesia availability  - All risks, benefits, and alternatives to above procedure(s) were discussed with the patient, all of her questions were answered to her expressed satisfaction, patient expresses she wishes to proceed, and informed consent was obtained.  - further management per primary team  All of the above findings and recommendations were discussed with the patient, patient's family (daughters), and the medical team, and all of patient's and family's questions were answered to their expressed satisfaction.  -- Edison Simon, PA-C [facility] 10/31/2018, 9:34 AM [phone] M-F: 7am - 4pm

## 2018-10-31 NOTE — Op Note (Signed)
Robotic assisted laparoscopic Cholecystectomy  Pre-operative Diagnosis: Biliary colic hx jaundice  Post-operative Diagnosis: same  Procedure:  Robotic assisted laparoscopic Cholecystectomy  Surgeon: Caroleen Hamman, MD FACS  Anesthesia: Gen. with endotracheal tube  Findings: Cholecystitis  Fatty liver Difficult case due to super morbid obesity, robotic platform was fundamental in performing a MIS approach  Estimated Blood Loss: 5cc       Specimens: Gallbladder           Complications: none   Procedure Details  The patient was seen again in the Holding Room. The benefits, complications, treatment options, and expected outcomes were discussed with the patient. The risks of bleeding, infection, recurrence of symptoms, failure to resolve symptoms, bile duct damage, bile duct leak, retained common bile duct stone, bowel injury, any of which could require further surgery and/or ERCP, stent, or papillotomy were reviewed with the patient. The likelihood of improving the patient's symptoms with return to their baseline status is good.  The patient and/or family concurred with the proposed plan, giving informed consent.  The patient was taken to Operating Room, identified  and the procedure verified as Laparoscopic Cholecystectomy.  A Time Out was held and the above information confirmed.  Prior to the induction of general anesthesia, antibiotic prophylaxis was administered. VTE prophylaxis was in place. General endotracheal anesthesia was then administered and tolerated well. After the induction, the abdomen was prepped with Chloraprep and draped in the sterile fashion. The patient was positioned in the supine position.  Cut down technique was used to enter the abdominal cavity and a Hasson trochar was placed after two vicryl stitches were anchored to the fascia. Pneumoperitoneum was then created with CO2 and tolerated well without any adverse changes in the patient's vital signs.  Three 8-mm ports  were placed under direct vision. All skin incisions  were infiltrated with a local anesthetic agent before making the incision and placing the trocars.   The patient was positioned  in reverse Trendelenburg, robot was brought to the surgical field and docked in the standard fashion.  We made sure all the instrumentation was kept indirect view at all times and that there were no collision between the arms. I scrubbed out and went to the console.  The gallbladder was identified, the fundus grasped and retracted cephalad. Adhesions were lysed bluntly. The infundibulum was grasped and retracted laterally, exposing the peritoneum overlying the triangle of Calot. This was then divided and exposed in a blunt fashion. An extended critical view of the cystic duct and cystic artery was obtained.  The cystic duct was clearly identified and bluntly dissected.   Artery and duct were double clipped and divided. Using ICG cholangiography we visualize the cystic duct and there was no abnormal  biliary ductal anatomy or evidence of bile injuries. The gallbladder was taken from the gallbladder fossa in a retrograde fashion with the electrocautery.  Hemostasis was achieved with the electrocautery. nspection of the right upper quadrant was performed. No bleeding, bile duct injury or leak, or bowel injury was noted. Robotic instruments and robotic arms were undocked in the standard fashion.  I scrubbed back in.  The gallbladder was removed and placed in an Endocatch bag.   Pneumoperitoneum was released.  The periumbilical port site was closed with interrumpted 0 Vicryl sutures. 4-0 subcuticular Monocryl was used to close the skin. Dermabond was  applied.  The patient was then extubated and brought to the recovery room in stable condition. Sponge, lap, and needle counts were correct at closure  and at the conclusion of the case.               Caroleen Hamman, MD, FACS

## 2018-10-31 NOTE — Progress Notes (Signed)
   10/31/18 1115  Clinical Encounter Type  Visited With Patient  Visit Type Initial  Referral From Physician   Chaplain received an OR to complete or update an AD. Upon arrival, the patient was laying in bed on her right side. She propped herself up on her elbow as I walked in. She said that she if feeling "okay" today, but would admittedly rather be at home. She is looking forward to being on the other side of her surgery and looks forward to getting back to life. The patient is not interested in completing the AD at this time, nor was she able to discuss it. The patient stated that she does not have a living will, but knows it is important. The patient stated that each time she gets to "this point" to discuss, she is unable to do so. The patient stated that she did not want to discuss the AD today and needs to talk things over with her daughters, who mean the world to her. The patient shared that they all enjoy a close personal relationship and her daughters are very protective of her. 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 Chaplain provided support in the form of a listening ear and active and reflective listening.

## 2018-10-31 NOTE — Anesthesia Postprocedure Evaluation (Signed)
Anesthesia Post Note  Patient: Alexis Greer  Procedure(s) Performed: XI ROBOTIC ASSISTED LAPAROSCOPIC CHOLECYSTECTOMY (N/A )  Patient location during evaluation: PACU Anesthesia Type: General Level of consciousness: awake and alert Pain management: pain level controlled Vital Signs Assessment: post-procedure vital signs reviewed and stable Respiratory status: spontaneous breathing and respiratory function stable Cardiovascular status: stable Anesthetic complications: no     Last Vitals:  Vitals:   10/31/18 1251 10/31/18 1515  BP: 124/64 (!) 92/47  Pulse: 80 70  Resp:  17  Temp: 37.3 C 36.5 C  SpO2: 98% 96%    Last Pain:  Vitals:   10/31/18 1515  TempSrc:   PainSc: Asleep                 KEPHART,WILLIAM K

## 2018-10-31 NOTE — Care Management Important Message (Signed)
Important Message  Patient Details  Name: TANAZIA ACHEE MRN: [id number] Date of Birth: 10-Mar-1941   Medicare Important Message Given:  Yes  Initial Medicare IM given by Patient Access Associate on 10/31/2018 at 12:16pm.  Still valid.    Dannette Barbara 10/31/2018, 4:41 PM

## 2018-10-31 NOTE — Progress Notes (Signed)
[facility] [place] NAME: Alexis Greer    MR#:  [id number]  DATE OF BIRTH:  77-15-1942  SUBJECTIVE:   Patient reports no abdominal pain.  Plan for robotic cholecystectomy later this afternoon.  REVIEW OF SYSTEMS:    Review of Systems  Constitutional: Negative for fever, chills weight loss HENT: Negative for ear pain, nosebleeds, congestion, facial swelling, rhinorrhea, neck pain, neck stiffness and ear discharge.   Respiratory: Negative for cough, shortness of breath, wheezing  Cardiovascular: Negative for chest pain, palpitations and leg swelling.  Gastrointestinal: Negative for heartburn, abdominal pain, vomiting, diarrhea or consitpation Genitourinary: Negative for dysuria, urgency, frequency, hematuria Musculoskeletal: Negative for back pain or joint pain Neurological: Negative for dizziness, seizures, syncope, focal weakness,  numbness and headaches.  Hematological: Does not bruise/bleed easily.  Psychiatric/Behavioral: Negative for hallucinations, confusion, dysphoric mood    Tolerating Diet: npo      DRUG ALLERGIES:   Allergies  Allergen Reactions  . Oxycodone Other (See Comments)    It made the patient have shakes and jittery     VITALS:  Blood pressure 105/66, pulse 86, temperature 98.8 F (37.1 C), temperature source Oral, resp. rate 20, height 5\' 2"  (1.575 m), weight 106.6 kg, SpO2 96 %.  PHYSICAL EXAMINATION:  Constitutional: Appears well-developed and well-nourished. No distress. HENT: Normocephalic. Marland Kitchen Oropharynx is clear and moist.  Eyes: Conjunctivae and EOM are normal. PERRLA, no scleral icterus.  Neck: Normal ROM. Neck supple. No JVD. No tracheal deviation. CVS: RRR, S1/S2 +, no murmurs, no gallops, no carotid bruit.  Pulmonary: Effort and breath sounds normal, no stridor, rhonchi, wheezes, rales.  Abdominal: Soft. BS +,  no distension, tenderness, rebound or guarding.  Musculoskeletal: Normal range  of motion. No edema and no tenderness.  Neuro: Alert. CN 2-12 grossly intact. No focal deficits. Skin: Skin is warm and dry. No rash noted. Psychiatric: Normal mood and affect.      LABORATORY PANEL:   CBC Recent Labs  Lab 10/31/18 0627  WBC 6.3  HGB 9.6*  HCT 29.2*  PLT 195   ------------------------------------------------------------------------------------------------------------------  Chemistries  Recent Labs  Lab 10/31/18 0627  NA 138  K 4.0  CL 109  CO2 20*  GLUCOSE 75  BUN 46*  CREATININE 2.95*  CALCIUM 9.1  AST 120*  ALT 226*  ALKPHOS 168*  BILITOT 2.1*   ------------------------------------------------------------------------------------------------------------------  Cardiac Enzymes No results for input(s): TROPONINI in the last 168 hours. ------------------------------------------------------------------------------------------------------------------  RADIOLOGY:  US Renal  Result Date: 10/30/2018 CLINICAL DATA:  Renal failure, diabetes mellitus, hypertension EXAM: RENAL / URINARY TRACT ULTRASOUND COMPLETE COMPARISON:  CT abdomen and pelvis 07/30/2012 FINDINGS: Right Kidney: Renal measurements: 10.3 x 4.4 x 5.2 cm = volume: 124 mL. Normal cortical thickness. Increased cortical echogenicity. No hydronephrosis or shadowing calcification. Hypoechoic nodule at inferior pole 20 x 16 x 18 mm question cyst, measured 14 mm diameter on prior CT. Additional small hypoechoic nodule at upper to mid RIGHT kidney 17 x 13 x 16 mm, also containing a few scattered internal echoes, question additional cyst. No septations or mural nodularity within the observed cysts. Left Kidney: Renal measurements: 9.4 x 5.7 x 4.5 cm = volume: 125 mL. Cortical thinning period increased cortical echogenicity. No hydronephrosis or shadowing calcification. Small mid renal cyst 21 x 19 x 22 mm. Additional small mid renal cyst 15 x 14 x 12 mm. Both of these of increased in sizes since 2014 CT. No  mural nodularity or septations within the  observed cysts. Bladder: Appears normal for degree of bladder distention. IMPRESSION: Medical renal disease changes of both kidneys. BILATERAL renal cysts, all of which contain a few scattered internal echoes, suspect artifacts related to body habitus. Electronically Signed   By: Lavonia Dana M.D.   On: 10/30/2018 12:47   Mr Abdomen Mrcp Wo Contrast  Result Date: 10/31/2018 CLINICAL DATA:  Nausea vomiting and RIGHT upper quadrant pain. Breast cancer history as well as chronic renal disease. Gallbladder sludge in gallstones on ultrasound EXAM: MRI ABDOMEN WITHOUT CONTRAST  (INCLUDING MRCP) TECHNIQUE: Multiplanar multisequence MR imaging of the abdomen was performed. Heavily T2-weighted images of the biliary and pancreatic ducts were obtained, and three-dimensional MRCP images were rendered by post processing. COMPARISON:  Ultrasound 10/30/2018 FINDINGS: Lower chest:  Lung bases are clear. Hepatobiliary: Normal hepatic parenchymal intensity. No intrahepatic biliary duct dilatation. Common bile duct normal caliber. No filling defect within the common bile duct Gallbladder is minimally distended to 3.7 cm. No gallstones are clearly identified. There is dependent material within the fundus (image 32/8). No gallbladder wall thickening. No pericholecystic fluid. Pancreas: Normal pancreatic parenchymal intensity. No ductal dilatation or inflammation. Spleen: Normal spleen. Adrenals/urinary tract: Adrenal glands normal. Several simple fluid intensity cysts within the kidneys. Stomach/Bowel: Stomach and limited of the small bowel is unremarkable Vascular/Lymphatic: Abdominal aortic normal caliber. No retroperitoneal periportal lymphadenopathy. Musculoskeletal: No aggressive osseous lesion IMPRESSION: 1. Normal biliary tree. No choledocholithiasis or duct dilatation. Common bile duct normal. 2. Small sludge within the gallbladder fundus. No evidence acute cholecystitis. 3. Normal  liver parenchyma. 4. Normal pancreas. 5. Benign-appearing cysts in the kidneys.  No IV contrast. Electronically Signed   By: Suzy Bouchard M.D.   On: 10/31/2018 08:50   US Abdomen Limited Ruq  Result Date: 10/30/2018 CLINICAL DATA:  Transaminitis, hyperbilirubinemia, epigastric pain, reflux, history breast cancer, diabetes mellitus, hypertension EXAM: ULTRASOUND ABDOMEN LIMITED RIGHT UPPER QUADRANT COMPARISON:  CT abdomen and pelvis 07/30/2012 FINDINGS: Gallbladder: Normally distended. Dependent echogenic material with suspect mild posterior shadowing on transverse imaging, question tiny dependent calculi versus less likely sludge. No gallbladder wall thickening, pericholecystic fluid or sonographic Murphy sign. Common bile duct: Diameter: 3 mm, normal Liver: Echogenic parenchyma, likely fatty infiltration though this can be seen with cirrhosis and certain infiltrative disorders. No focal hepatic mass or nodularity. Portal vein is patent on color Doppler imaging with normal direction of blood flow towards the liver. Other: No RIGHT upper quadrant free fluid. IMPRESSION: Probable fatty infiltration of liver as above. Dependent echogenic material within the gallbladder, with a suspicion of posterior shadowing on some of the transverse images, question tiny dependent calculi versus less likely sludge. No sonographic evidence of acute cholecystitis or biliary obstruction. Electronically Signed   By: Lavonia Dana M.D.   On: 10/30/2018 12:44     ASSESSMENT AND PLAN:   78 year old female with diabetes and chronic kidney disease stage III presented emergency room due to abdominal pain.  1.  Abdominal pain with abnormal LFTs: MRCP shows sludge but no evidence of acute cholecystitis or biliary obstruction.  Plan for robotic cholecystectomy as per surgery.  2.  Acute on chronic kidney disease stage III:  Continue to hold nephrotoxic medications Creatinine improving with IV fluids  3.  Diabetes with  hypoglycemia due to acute kidney injury and taking medications: Continue D5 for now with normal saline at 75 cc an hour.  Continue CBG checks every 4 hours.  Hold all diabetic medications.  4.  Hypertension: Patient's blood pressure is low normal  and therefore have discontinued all her blood pressure medications with exception of Coreg which I decreased to 6.25 twice daily. I will follow blood pressure closely.  5.  Anemia of chronic disease/iron deficiency: Continue on ferrous sulfate    Management plans discussed with the patient and she is in agreement.  CODE STATUS: full  TOTAL TIME TAKING CARE OF THIS PATIENT: 30 minutes.     POSSIBLE D/C tomorrow, DEPENDING ON CLINICAL CONDITION.   Bettey Costa M.D on 10/31/2018 at 10:07 AM  Between 7am to 6pm - Pager - [phone] After 6pm go to [email or web address] - password [email or web address] [facility]  Office  [phone]  CC: Primary care physician; Juluis Pitch, MD  Note: This dictation was prepared with Dragon dictation along with smaller phrase technology. Any transcriptional errors that result from this process are unintentional.

## 2018-11-01 ENCOUNTER — Inpatient Hospital Stay: Payer: Medicare HMO

## 2018-11-01 LAB — COMPREHENSIVE METABOLIC PANEL
ALT: 169 U/L — ABNORMAL HIGH (ref 0–44)
AST: 97 U/L — ABNORMAL HIGH (ref 15–41)
Albumin: 2.9 g/dL — ABNORMAL LOW (ref 3.5–5.0)
Alkaline Phosphatase: 169 U/L — ABNORMAL HIGH (ref 38–126)
Anion gap: 12 (ref 5–15)
BUN: 50 mg/dL — ABNORMAL HIGH (ref 8–23)
CO2: 16 mmol/L — ABNORMAL LOW (ref 22–32)
Calcium: 9 mg/dL (ref 8.9–10.3)
Chloride: 108 mmol/L (ref 98–111)
Creatinine, Ser: 3.26 mg/dL — ABNORMAL HIGH (ref 0.44–1.00)
GFR calc Af Amer: 15 mL/min — ABNORMAL LOW (ref >60)
GFR calc non Af Amer: 13 mL/min — ABNORMAL LOW (ref >60)
Glucose, Bld: 284 mg/dL — ABNORMAL HIGH (ref 70–99)
Potassium: 4.6 mmol/L (ref 3.5–5.1)
Sodium: 136 mmol/L (ref 135–145)
Total Bilirubin: 1.7 mg/dL — ABNORMAL HIGH (ref 0.3–1.2)
Total Protein: 6.2 g/dL — ABNORMAL LOW (ref 6.5–8.1)

## 2018-11-01 LAB — GLUCOSE, CAPILLARY
Glucose-Capillary: 157 mg/dL — ABNORMAL HIGH (ref 70–99)
Glucose-Capillary: 161 mg/dL — ABNORMAL HIGH (ref 70–99)
Glucose-Capillary: 166 mg/dL — ABNORMAL HIGH (ref 70–99)
Glucose-Capillary: 232 mg/dL — ABNORMAL HIGH (ref 70–99)
Glucose-Capillary: 270 mg/dL — ABNORMAL HIGH (ref 70–99)
Glucose-Capillary: 278 mg/dL — ABNORMAL HIGH (ref 70–99)

## 2018-11-01 LAB — CBC
HCT: 27.3 % — ABNORMAL LOW (ref 36.0–46.0)
Hemoglobin: 8.8 g/dL — ABNORMAL LOW (ref 12.0–15.0)
MCH: 30.4 pg (ref 26.0–34.0)
MCHC: 32.2 g/dL (ref 30.0–36.0)
MCV: 94.5 fL (ref 80.0–100.0)
Platelets: 206 10*3/uL (ref 150–400)
RBC: 2.89 MIL/uL — ABNORMAL LOW (ref 3.87–5.11)
RDW: 14.8 % (ref 11.5–15.5)
WBC: 11.3 10*3/uL — ABNORMAL HIGH (ref 4.0–10.5)
nRBC: 0 % (ref 0.0–0.2)

## 2018-11-01 MED ORDER — INSULIN GLARGINE 100 UNIT/ML ~~LOC~~ SOLN
20.0000 [IU] | Freq: Every day | SUBCUTANEOUS | Status: DC
  Administered 2018-11-01 – 2018-11-03 (×3): 20 [IU] via SUBCUTANEOUS
  Filled 2018-11-01 (×3): qty 0.2

## 2018-11-01 MED ORDER — SODIUM CHLORIDE 0.9 % IV SOLN
INTRAVENOUS | Status: DC
  Administered 2018-11-01: 13:00:00 via INTRAVENOUS

## 2018-11-01 MED ORDER — SODIUM CHLORIDE 0.9 % IV BOLUS
1000.0000 mL | Freq: Once | INTRAVENOUS | Status: AC
  Administered 2018-11-01: 1000 mL via INTRAVENOUS

## 2018-11-01 MED ORDER — CARVEDILOL 6.25 MG PO TABS: 3.1250 mg | ORAL_TABLET | Freq: Every day | ORAL | Status: DC

## 2018-11-01 NOTE — Progress Notes (Addendum)
Ch visited with pt as requested. Pt was alert upon ch arrival and had a positive affect. Ch informed pt of alternative treatments for Jehovah's Witness pts specifically regarding their medical treatment options that would not violate her faith. Pt shared that she wanted to go over the information with her daughters (one local another in [place]). Ch understood and wanted pt to be educated on her options. Pt shared that [place] had 2 [place] which is why she is apprehensive about having any major procedures. Pt shared that she has issues with her knees but does not want to take the risk nor does she want to be in a rehab facility. Ch provided a compassionate presence and understood pt's reluctance and concerns. Pt shared that her appetite has decreased since she has what she thinks is a blockage. Ch asked guided questions to determine if the pt has experienced any other health changes recently. Pt shared that her insulin levels have been unstable ranging from upper 50s to over 200. Ch became aware that this was a major concern for the pt as well. Ch visited was appreciated.    11/01/18 0900  Clinical Encounter Type  Visited With Patient  Visit Type Follow-up;Other (Comment) (AD infromation )  Referral From Chaplain  Consult/Referral To Chaplain  Spiritual Encounters  Spiritual Needs Emotional;Grief support;Literature  Stress Factors  Patient Stress Factors Health changes;Major life changes  Family Stress Factors None identified  Advance Directives (For Healthcare)  Does Patient Have a Medical Advance Directive? No  Would patient like information on creating a medical advance directive? Yes (Inpatient - patient defers creating a medical advance directive at this time - Information given) (Pt provided with a Jehovah Witness Medical AD form  )

## 2018-11-01 NOTE — Progress Notes (Signed)
Inpatient Diabetes Program Recommendations  AACE/ADA: New Consensus Statement on Inpatient Glycemic Control (2015)  Target Ranges:  Prepandial:   less than 140 mg/dL      Peak postprandial:   less than 180 mg/dL (1-2 hours)      Critically ill patients:  140 - 180 mg/dL   Results for Alexis Greer, Alexis Greer (MRN [id number]) as of 11/01/2018 07:44  Ref. Range 10/31/2018 00:03 10/31/2018 03:45 10/31/2018 07:50 10/31/2018 09:00 10/31/2018 11:53 10/31/2018 12:54 10/31/2018 15:18 10/31/2018 16:44 10/31/2018 20:30  Glucose-Capillary Latest Ref Range: 70 - 99 mg/dL 70 74 68 (L) 116 (H) 87 80  10 mg Decadron given at 2pm 202 (H) 204 (H) 278 (H)   Results for Alexis Greer, Alexis Greer (MRN [id number]) as of 11/01/2018 07:44  Ref. Range 11/01/2018 00:14 11/01/2018 04:13 11/01/2018 07:33  Glucose-Capillary Latest Ref Range: 70 - 99 mg/dL 278 (H) 270 (H) 232 (H)     Admit for Cholecystectomy  History: DM, CKD   Home DM Meds: 70/30 Insulin 22 units AM/ 32 units PM        Amaryl 4 mg Daily  Current Orders: Novolog Sensitive Correction Scale/ SSI (0-9 units) TID AC      Mild HYPO event yesteday at 8am today.   Was NPO for Cholecystectomy yesterday.  Getting Clear Liquid diet this AM.     MD- Note that patient takes 70/30 Insulin at home.  70/30 not ideal in the hospital when patient having variable PO intake.  Got 10 mg Decadron X 1 dose yest at 2pm.  CBGs now elevated >200 mg/dl.  Please consider starting Lantus for this patient: Lantus 20 units Daily (0.2 units/kg dosing based on weight of 106 kg)--Please start this AM       --Will follow patient during hospitalization--  Wyn Quaker RN, MSN, CDE Diabetes Coordinator Inpatient Glycemic Control Team Team Pager: [phone] (8a-5p)

## 2018-11-01 NOTE — Progress Notes (Addendum)
[facility] Hospital Day(s): 2.   Post op day(s): 1 Day Post-Op.   Interval History: Patient seen and examined, no acute events or new complaints overnight. Patient reports that she has been feeling good since the surgery. She reports just minimal abdominal "irritation." No fever, chills, nausea, or emesis. She tolerated clear liquids since the surgery. Mobilizing well. She did have another bump in her creatinine this morning.    Vital signs in last 24 hours: [min-max] current  Temp:  [97.3 F (36.3 C)-99.7 F (37.6 C)] 98.6 F (37 C) (08/07 0415) Pulse Rate:  [65-80] 77 (08/07 0415) Resp:  [15-20] 18 (08/07 0415) BP: (91-126)/(47-87) 100/87 (08/07 0415) SpO2:  [93 %-100 %] 98 % (08/07 0415) Weight:  [106.6 kg] 106.6 kg (08/06 1251)     Height: 5\' 2"  (157.5 cm) Weight: 106.6 kg BMI (Calculated): 42.97   Intake/Output last 2 shifts:  08/06 0701 - 08/07 0700 In: 200 [I.V.:100; IV Piggyback:100] Out: -    Physical Exam:  Constitutional: alert, cooperative and no distress  Respiratory: breathing non-labored at rest  Gastrointestinal: soft, non-tender, and non-distended. No rebound/guarding Integumentary: Laparoscopic incisions are CDI with glue, no erythema, no drainage  Labs:  CBC Latest Ref Rng & Units 11/01/2018 10/31/2018 10/30/2018  WBC 4.0 - 10.5 K/uL 11.3(H) 6.3 11.9(H)  Hemoglobin 12.0 - 15.0 g/dL 8.8(L) 9.6(L) 10.2(L)  Hematocrit 36.0 - 46.0 % 27.3(L) 29.2(L) 31.2(L)  Platelets 150 - 400 K/uL 206 195 201   CMP Latest Ref Rng & Units 11/01/2018 10/31/2018 10/30/2018  Glucose 70 - 99 mg/dL 284(H) 75 -  BUN 8 - 23 mg/dL 50(H) 46(H) -  Creatinine 0.44 - 1.00 mg/dL 3.26(H) 2.95(H) -  Sodium 135 - 145 mmol/L 136 138 -  Potassium 3.5 - 5.1 mmol/L 4.6 4.0 -  Chloride 98 - 111 mmol/L 108 109 -  CO2 22 - 32 mmol/L 16(L) 20(L) -  Calcium 8.9 - 10.3 mg/dL 9.0 9.1 -  Total Protein 6.5 - 8.1 g/dL 6.2(L) 6.2(L) -  Total Bilirubin 0.3 - 1.2 mg/dL  1.7(H) 2.1(H) 3.1(H)  Alkaline Phos 38 - 126 U/L 169(H) 168(H) -  AST 15 - 41 U/L 97(H) 120(H) -  ALT 0 - 44 U/L 169(H) 226(H) -     Imaging studies: No new pertinent imaging studies   Assessment/Plan:  78 y.o. female with slight increase in renal function otherwise doing well 1 Day Post-Op s/p robotic assisted laparoscopic cholecystectomy for biliary colic with history of jaundice, complicated by pertinent comorbidities including acute on chronic renal failure.   - Okay for diet advancement as tolerates  - Consider IVF hydration for renal function  - pain control prn; antiemetics prn  - monitor abdominal examination  - mobilization encouraged  - no further surgical issues  - further management per primary service  All of the above findings and recommendations were discussed with the patient, and the medical team, and all of patient's questions were answered to her expressed satisfaction.  -- Edison Simon, PA-C [facility] 11/01/2018, 7:48 AM [phone] M-F: 7am - 4pm  I saw and evaluated the patient.  I agree with the above documentation, exam, and plan, which I have edited where appropriate. Fredirick Maudlin  10:18 AM

## 2018-11-01 NOTE — Progress Notes (Signed)
[facility] NAME: Alexis Greer    MR#:  [id number]  DATE OF BIRTH:  1940/08/19  SUBJECTIVE:   Reports no abdominal pain and is doing well post surgery.  REVIEW OF SYSTEMS:    Review of Systems  Constitutional: Negative for fever, chills weight loss HENT: Negative for ear pain, nosebleeds, congestion, facial swelling, rhinorrhea, neck pain, neck stiffness and ear discharge.   Respiratory: Negative for cough, shortness of breath, wheezing  Cardiovascular: Negative for chest pain, palpitations and leg swelling.  Gastrointestinal: Negative for heartburn, abdominal pain, vomiting, diarrhea or consitpation Genitourinary: Negative for dysuria, urgency, frequency, hematuria Musculoskeletal: Negative for back pain or joint pain Neurological: Negative for dizziness, seizures, syncope, focal weakness,  numbness and headaches.  Hematological: Does not bruise/bleed easily.  Psychiatric/Behavioral: Negative for hallucinations, confusion, dysphoric mood    Tolerating Diet: yes      DRUG ALLERGIES:   Allergies  Allergen Reactions  . Oxycodone Other (See Comments)    It made the patient have shakes and jittery     VITALS:  Blood pressure 100/87, pulse 77, temperature 98.6 F (37 C), temperature source Oral, resp. rate 18, height 5\' 2"  (1.575 m), weight 106.6 kg, SpO2 98 %.  PHYSICAL EXAMINATION:  Constitutional: Appears well-developed and well-nourished. No distress. HENT: Normocephalic. Marland Kitchen Oropharynx is clear and moist.  Eyes: Conjunctivae and EOM are normal. PERRLA, no scleral icterus.  Neck: Normal ROM. Neck supple. No JVD. No tracheal deviation. CVS: RRR, S1/S2 +, no murmurs, no gallops, no carotid bruit.  Pulmonary: Effort and breath sounds normal, no stridor, rhonchi, wheezes, rales.  Abdominal: Soft. BS +,  no distension, tenderness, rebound or guarding.  Musculoskeletal: Normal range of motion. No edema and no  tenderness.  Neuro: Alert. CN 2-12 grossly intact. No focal deficits. Skin: Skin is warm and dry. No rash noted. Psychiatric: Normal mood and affect.      LABORATORY PANEL:   CBC Recent Labs  Lab 11/01/18 0441  WBC 11.3*  HGB 8.8*  HCT 27.3*  PLT 206   ------------------------------------------------------------------------------------------------------------------  Chemistries  Recent Labs  Lab 11/01/18 0441  NA 136  K 4.6  CL 108  CO2 16*  GLUCOSE 284*  BUN 50*  CREATININE 3.26*  CALCIUM 9.0  AST 97*  ALT 169*  ALKPHOS 169*  BILITOT 1.7*   ------------------------------------------------------------------------------------------------------------------  Cardiac Enzymes No results for input(s): TROPONINI in the last 168 hours. ------------------------------------------------------------------------------------------------------------------  RADIOLOGY:  US Renal  Result Date: 10/30/2018 CLINICAL DATA:  Renal failure, diabetes mellitus, hypertension EXAM: RENAL / URINARY TRACT ULTRASOUND COMPLETE COMPARISON:  CT abdomen and pelvis 07/30/2012 FINDINGS: Right Kidney: Renal measurements: 10.3 x 4.4 x 5.2 cm = volume: 124 mL. Normal cortical thickness. Increased cortical echogenicity. No hydronephrosis or shadowing calcification. Hypoechoic nodule at inferior pole 20 x 16 x 18 mm question cyst, measured 14 mm diameter on prior CT. Additional small hypoechoic nodule at upper to mid RIGHT kidney 17 x 13 x 16 mm, also containing a few scattered internal echoes, question additional cyst. No septations or mural nodularity within the observed cysts. Left Kidney: Renal measurements: 9.4 x 5.7 x 4.5 cm = volume: 125 mL. Cortical thinning period increased cortical echogenicity. No hydronephrosis or shadowing calcification. Small mid renal cyst 21 x 19 x 22 mm. Additional small mid renal cyst 15 x 14 x 12 mm. Both of these of increased in sizes since 2014 CT. No mural nodularity or  septations within the observed cysts. Bladder:  Appears normal for degree of bladder distention. IMPRESSION: Medical renal disease changes of both kidneys. BILATERAL renal cysts, all of which contain a few scattered internal echoes, suspect artifacts related to body habitus. Electronically Signed   By: Lavonia Dana M.D.   On: 10/30/2018 12:47   Mr Abdomen Mrcp Wo Contrast  Result Date: 10/31/2018 CLINICAL DATA:  Nausea vomiting and RIGHT upper quadrant pain. Breast cancer history as well as chronic renal disease. Gallbladder sludge in gallstones on ultrasound EXAM: MRI ABDOMEN WITHOUT CONTRAST  (INCLUDING MRCP) TECHNIQUE: Multiplanar multisequence MR imaging of the abdomen was performed. Heavily T2-weighted images of the biliary and pancreatic ducts were obtained, and three-dimensional MRCP images were rendered by post processing. COMPARISON:  Ultrasound 10/30/2018 FINDINGS: Lower chest:  Lung bases are clear. Hepatobiliary: Normal hepatic parenchymal intensity. No intrahepatic biliary duct dilatation. Common bile duct normal caliber. No filling defect within the common bile duct Gallbladder is minimally distended to 3.7 cm. No gallstones are clearly identified. There is dependent material within the fundus (image 32/8). No gallbladder wall thickening. No pericholecystic fluid. Pancreas: Normal pancreatic parenchymal intensity. No ductal dilatation or inflammation. Spleen: Normal spleen. Adrenals/urinary tract: Adrenal glands normal. Several simple fluid intensity cysts within the kidneys. Stomach/Bowel: Stomach and limited of the small bowel is unremarkable Vascular/Lymphatic: Abdominal aortic normal caliber. No retroperitoneal periportal lymphadenopathy. Musculoskeletal: No aggressive osseous lesion IMPRESSION: 1. Normal biliary tree. No choledocholithiasis or duct dilatation. Common bile duct normal. 2. Small sludge within the gallbladder fundus. No evidence acute cholecystitis. 3. Normal liver parenchyma. 4.  Normal pancreas. 5. Benign-appearing cysts in the kidneys.  No IV contrast. Electronically Signed   By: Suzy Bouchard M.D.   On: 10/31/2018 08:50   Mr 3d Recon At Scanner  Result Date: 10/31/2018 CLINICAL DATA:  Nonspecific (abnormal) findings on radiological and other examination of musculoskeletal sysem. EXAM: 3-DIMENSIONAL MR IMAGE RENDERING ON AQUISITION WORKSTATION TECHNIQUE: 3-dimensional MR images were rendered by post-processing of the original MR data on an aquisition workstation. The 3-dimensional MR images were interpreted and findings were reported in the accompanying complete MR report for this study COMPARISON:  None. FINDINGS: See report accompanying MRI same day IMPRESSION: See report accompanying MRI same day Electronically Signed   By: Suzy Bouchard M.D.   On: 10/31/2018 10:24   US Abdomen Limited Ruq  Result Date: 10/30/2018 CLINICAL DATA:  Transaminitis, hyperbilirubinemia, epigastric pain, reflux, history breast cancer, diabetes mellitus, hypertension EXAM: ULTRASOUND ABDOMEN LIMITED RIGHT UPPER QUADRANT COMPARISON:  CT abdomen and pelvis 07/30/2012 FINDINGS: Gallbladder: Normally distended. Dependent echogenic material with suspect mild posterior shadowing on transverse imaging, question tiny dependent calculi versus less likely sludge. No gallbladder wall thickening, pericholecystic fluid or sonographic Murphy sign. Common bile duct: Diameter: 3 mm, normal Liver: Echogenic parenchyma, likely fatty infiltration though this can be seen with cirrhosis and certain infiltrative disorders. No focal hepatic mass or nodularity. Portal vein is patent on color Doppler imaging with normal direction of blood flow towards the liver. Other: No RIGHT upper quadrant free fluid. IMPRESSION: Probable fatty infiltration of liver as above. Dependent echogenic material within the gallbladder, with a suspicion of posterior shadowing on some of the transverse images, question tiny dependent calculi  versus less likely sludge. No sonographic evidence of acute cholecystitis or biliary obstruction. Electronically Signed   By: Lavonia Dana M.D.   On: 10/30/2018 12:44     ASSESSMENT AND PLAN:   78 year old female with diabetes and chronic kidney disease stage III presented emergency room due to abdominal pain.  1.  Abdominal pain due to biliary colic: Patient is postop day #1 robotic assisted laparoscopic cholecystectomy Doing well  2.  Acute on chronic kidney disease stage III: Due to hypovolemia/prerenal azotemia vs ATN from low/normal BP Creatinine is increased today. Renal ultrasound ordered Nephrology consultation placed via epic and discussed with Dr. Candiss Norse.  Continue to hold nephrotoxic medications  3.  Diabetes with hypoglycemia due to acute kidney injury and taking medications:  Blood sugars have improved Diabetes nurse consult requested.  4.  Hypertension: Patient's blood pressure is low normal and therefore have discontinued all her blood pressure medications with exception of Coreg which I decreased to  3.125 mg twice daily. I will continue to follow blood pressure closely.  5.  Anemia of chronic disease/iron deficiency: Continue on ferrous sulfate    Management plans discussed with the patient and she is in agreement.  CODE STATUS: full  TOTAL TIME TAKING CARE OF THIS PATIENT: 27 minutes.   D/w surgery  POSSIBLE D/C tomorrow, DEPENDING ON CLINICAL CONDITION.   Bettey Costa M.D on 11/01/2018 at 10:41 AM  Between 7am to 6pm - Pager - [phone] After 6pm go to [email or web address] - password [email or web address] [facility]  Office  [phone]  CC: Primary care physician; Juluis Pitch, MD  Note: This dictation was prepared with Dragon dictation along with smaller phrase technology. Any transcriptional errors that result from this process are unintentional.

## 2018-11-01 NOTE — Consult Note (Signed)
[address] [place], [place] [address] Phone [phone]. Fax [phone]  Date: 11/01/2018                  Patient Name:  Alexis Greer  MRN: [id number]  DOB: Feb 08, 1941  Age / Sex: 78 y.o., female         PCP: Juluis Pitch, MD                 Service Requesting Consult: IM/ Bettey Costa, MD                 Reason for Consult: ARF            History of Present Illness: Patient is a 78 y.o. female with medical problems of Breast cancer, DM-2, who was admitted to [facility] on 10/30/2018 for evaluation of nausea, vomiting chills.  Patient underwent laproscopic assisted cholecystectomy on 8/6 Worsening renal trends Nephrology team consulted  Lab Results  Component Value Date   CREATININE 3.26 (H) 11/01/2018   CREATININE 2.95 (H) 10/31/2018   CREATININE 3.44 (H) 10/30/2018  hypotension noted yesterday  Medications: Outpatient medications: Facility-Administered Medications Prior to Admission  Medication Dose Route Frequency Provider Last Rate Last Dose  . ondansetron (ZOFRAN) injection 4 mg  4 mg Intravenous Once Algernon Huxley, MD       Medications Prior to Admission  Medication Sig Dispense Refill Last Dose  . ACCU-CHEK AVIVA PLUS test strip    10/30/2018 at Unknown time  . acetaminophen (TYLENOL) 500 MG tablet Take 1,000 mg by mouth every 6 (six) hours as needed for mild pain or headache.   Past Week at prn  . allopurinol (ZYLOPRIM) 100 MG tablet Take 100 mg by mouth 2 (two) times daily.    10/29/2018 at 2000  . amLODipine (NORVASC) 10 MG tablet Take 10 mg by mouth at bedtime.    10/29/2018 at 0800  . aspirin EC 81 MG tablet Take 81 mg by mouth daily.   10/29/2018 at 0800  . atorvastatin (LIPITOR) 40 MG tablet Take 40 mg by mouth daily at 6 PM.   10/29/2018 at 1800  . carvedilol (COREG) 25 MG tablet Take 25 mg by mouth daily.   10/29/2018 at 0800  . chlorthalidone (HYGROTON) 25 MG tablet Take 25 mg by mouth daily.    10/29/2018 at 0800  . Cholecalciferol (D-5000) 5000  UNITS TABS Take 5,000 Units by mouth daily.    10/29/2018 at 0800  . ezetimibe (ZETIA) 10 MG tablet Take 10 mg by mouth daily.   10/29/2018 at 2000  . ferrous sulfate 325 (65 FE) MG tablet Take 325 mg by mouth daily.   10/29/2018 at 0800  . glimepiride (AMARYL) 4 MG tablet Take 4 mg by mouth daily.    10/29/2018 at 0800  . Insulin Isophane & Regular Human (NOVOLIN 70/30 FLEXPEN) (70-30) 100 UNIT/ML PEN Inject into the skin.   10/29/2018 at 1700  . Multiple Vitamin (MULTI-VITAMINS) TABS Take 1 tablet by mouth daily.    10/29/2018 at 0800  . omeprazole (PRILOSEC) 20 MG capsule Take 40 mg by mouth daily. Takes 2 tablets daily   10/30/2018 at 0700  . valsartan (DIOVAN) 160 MG tablet Take 160 mg by mouth daily.   10/29/2018 at 0800  . vitamin B-12 (CYANOCOBALAMIN) 1000 MCG tablet Take 1,000 mcg by mouth daily.   10/29/2018 at 0800  . vitamin E 400 UNIT capsule Take 400 Units by mouth daily.    10/29/2018 at 0800  .  Menthol-Methyl Salicylate (MUSCLE RUB) 10-15 % CREA Apply 1 application topically as needed for muscle pain.   prn at prn    Current medications: Current Facility-Administered Medications  Medication Dose Route Frequency Provider Last Rate Last Dose  . acetaminophen (TYLENOL) tablet 1,000 mg  1,000 mg Oral Q6H PRN Pabon, Diego F, MD      . acetaminophen (TYLENOL) tablet 1,000 mg  1,000 mg Oral Q6H Pabon, Diego F, MD   1,000 mg at 11/01/18 0558  . allopurinol (ZYLOPRIM) tablet 100 mg  100 mg Oral BID Caroleen Hamman F, MD   100 mg at 11/01/18 0808  . aspirin EC tablet 81 mg  81 mg Oral Daily Pabon, [place] F, MD   81 mg at 11/01/18 0807  . carvedilol (COREG) tablet 6.25 mg  6.25 mg Oral Daily Pabon, [place] F, MD   6.25 mg at 11/01/18 0808  . cholecalciferol (VITAMIN D3) tablet 5,000 Units  5,000 Units Oral Daily Jules Husbands, MD   5,000 Units at 11/01/18 [phone]  . ferrous sulfate tablet 325 mg  325 mg Oral Daily Pabon, [place] F, MD   325 mg at 11/01/18 0808  . heparin injection 5,000 Units  5,000 Units Subcutaneous  Q8H Jules Husbands, MD   5,000 Units at 11/01/18 0601  . insulin aspart (novoLOG) injection 0-9 Units  0-9 Units Subcutaneous TID WC Jules Husbands, MD   3 Units at 11/01/18 0809  . morphine 4 MG/ML injection 2 mg  2 mg Intravenous Q3H PRN Pabon, Diego F, MD      . multivitamin with minerals tablet 1 tablet  1 tablet Oral Daily Jules Husbands, MD   1 tablet at 11/01/18 [phone]  . ondansetron (ZOFRAN) tablet 4 mg  4 mg Oral Q6H PRN Pabon, Diego F, MD       Or  . ondansetron (ZOFRAN) injection 4 mg  4 mg Intravenous Q6H PRN Pabon, Diego F, MD   4 mg at 10/31/18 1450  . pantoprazole (PROTONIX) EC tablet 40 mg  40 mg Oral Daily Pabon, [place] F, MD   40 mg at 11/01/18 0808  . senna-docusate (Senokot-S) tablet 1 tablet  1 tablet Oral QHS PRN Pabon, Diego F, MD      . traMADol (ULTRAM) tablet 100 mg  100 mg Oral Q6H PRN Pabon, Diego F, MD      . vitamin B-12 (CYANOCOBALAMIN) tablet 1,000 mcg  1,000 mcg Oral Daily Pabon, [place] F, MD   1,000 mcg at 11/01/18 0808  . vitamin E capsule 400 Units  400 Units Oral Daily Jules Husbands, MD   400 Units at 11/01/18 8341      Allergies: Allergies  Allergen Reactions  . Oxycodone Other (See Comments)    It made the patient have shakes and jittery       Past Medical History: Past Medical History:  Diagnosis Date  . Arthritis   . Asthma   . Breast cancer ([place]) 11/17/14   right breast lumpectomy, triple negative, with mammosite tx. Chemo with held due to CVA after lumpectomy  . Chronic kidney disease   . Complication of anesthesia    had a stroke during the breast lumpectomy  . Diabetes mellitus without complication ([place])   . Dyspnea   . Hyperlipidemia   . Hypertension   . Personal history of radiation therapy 2016    mammosite radiation-RIGHT lumpectomy  . Pneumonia   . Stroke [facility]) 2016   post lumpectomy  Past Surgical History: Past Surgical History:  Procedure Laterality Date  . ABDOMINAL HYSTERECTOMY  1979  . BREAST BIOPSY Right 11/17/2014    Invasive mammory carcinoma  . BREAST BIOPSY Right 12/01/2014   Procedure: BREAST BIOPSY WITH NEEDLE LOCALIZATION;  Surgeon: Christene Lye, MD;  Location: [facility] ORS;  Service: General;  Laterality: Right;  . BREAST LUMPECTOMY Right 2016   INVASIVE MAMMARY CARCINOMA   . BREAST LUMPECTOMY WITH SENTINEL LYMPH NODE BIOPSY Right 12/01/2014   Procedure: BREAST LUMPECTOMY WITH SENTINEL LYMPH NODE BX;  Surgeon: Christene Lye, MD;  Location: [facility] ORS;  Service: General;  Laterality: Right;  . COLONOSCOPY WITH PROPOFOL N/A 06/13/2016   Procedure: COLONOSCOPY WITH PROPOFOL;  Surgeon: Lollie Sails, MD;  Location: [facility] ENDOSCOPY;  Service: Endoscopy;  Laterality: N/A;  . EYE SURGERY    . LOWER EXTREMITY INTERVENTION Left 11/09/2016   Procedure: LOWER EXTREMITY INTERVENTION;  Surgeon: Algernon Huxley, MD;  Location: [facility] LAB;  Service: Cardiovascular;  Laterality: Left;  Marland Kitchen MASTECTOMY Right    lumpectomy   . OOPHORECTOMY  2014  . TONSILLECTOMY       Family History: Family History  Problem Relation Age of Onset  . Heart disease Mother   . Lung cancer Sister        smoker  . CAD Father   . Hyperlipidemia Father      Social History: Social History   Socioeconomic History  . Marital status: Divorced    Spouse name: Not on file  . Number of children: 2  . Years of education: 83  . Highest education level: High school graduate  Occupational History  . Not on file  Social Needs  . Financial resource strain: Not hard at all  . Food insecurity    Worry: Never true    Inability: Never true  . Transportation needs    Medical: No    Non-medical: No  Tobacco Use  . Smoking status: Former Smoker    Quit date: 08/25/1993    Years since quitting: 25.2  . Smokeless tobacco: Never Used  Substance and Sexual Activity  . Alcohol use: No    Alcohol/week: 0.0 standard drinks  . Drug use: No  . Sexual activity: Not Currently  Lifestyle  . Physical activity    Days per  week: 0 days    Minutes per session: 0 min  . Stress: Only a little  Relationships  . Social connections    Talks on phone: More than three times a week    Gets together: More than three times a week    Attends religious service: More than 4 times per year    Active member of club or organization: Yes    Attends meetings of clubs or organizations: More than 4 times per year    Relationship status: Divorced  . Intimate partner violence    Fear of current or ex partner: No    Emotionally abused: No    Physically abused: No    Forced sexual activity: No  Other Topics Concern  . Not on file  Social History Narrative  . Not on file     Review of Systems: Gen: Denies any fevers or chills, weight changes HEENT: No hearing or vision problems CV: Denies any chest pain, leg edema at this time Resp: No cough or shortness of breath GI: Nausea, decreased appetite 3 to 4 days prior to admission GU : Denies any blood in stool or urine MS: Ambulatory at  home Derm:    No complaints Psych: No complaints Heme:No complaints Neuro: No complaints Endocrine.  No complaints  Vital Signs: Blood pressure 100/87, pulse 77, temperature 98.6 F (37 C), temperature source Oral, resp. rate 18, height 5\' 2"  (1.575 m), weight 106.6 kg, SpO2 98 %.   Intake/Output Summary (Last 24 hours) at 11/01/2018 0931 Last data filed at 11/01/2018 0835 Gross per 24 hour  Intake 440 ml  Output -  Net 440 ml    Weight trends: Filed Weights   10/30/18 8921 10/31/18 1251  Weight: 106.6 kg 106.6 kg    Physical Exam: General:  No acute distress, obese  HEENT  anicteric, moist oral mucous membranes  Neck:  Supple  Lungs:  Normal breathing effort, clear to auscultation  Heart::  Regular rhythm  Abdomen:  Soft, obese, nontender  Extremities:  No edema  Neurologic:  Alert, oriented  Skin:  No acute rashes    Lab results: Basic Metabolic Panel: Recent Labs  Lab 10/30/18 0946 10/31/18 0627 11/01/18 0441   NA 137 138 136  K 4.3 4.0 4.6  CL 104 109 108  CO2 20* 20* 16*  GLUCOSE 90 75 284*  BUN 50* 46* 50*  CREATININE 3.44* 2.95* 3.26*  CALCIUM 9.7 9.1 9.0    Liver Function Tests: Recent Labs  Lab 11/01/18 0441  AST 97*  ALT 169*  ALKPHOS 169*  BILITOT 1.7*  PROT 6.2*  ALBUMIN 2.9*   No results for input(s): LIPASE, AMYLASE in the last 168 hours. No results for input(s): AMMONIA in the last 168 hours.  CBC: Recent Labs  Lab 10/30/18 0946 10/31/18 0627 11/01/18 0441  WBC 11.9* 6.3 11.3*  NEUTROABS 10.5*  --   --   HGB 10.2* 9.6* 8.8*  HCT 31.2* 29.2* 27.3*  MCV 92.9 92.7 94.5  PLT 201 195 206    Cardiac Enzymes: No results for input(s): CKTOTAL, TROPONINI in the last 168 hours.  BNP: Invalid input(s): POCBNP  CBG: Recent Labs  Lab 10/31/18 1644 10/31/18 2030 11/01/18 0014 11/01/18 0413 11/01/18 0733  GLUCAP 204* 278* 278* 270* 232*    Microbiology: Recent Results (from the past 720 hour(s))  SARS Coronavirus 2 [facility] order, Performed in [facility] hospital lab) Nasopharyngeal Nasopharyngeal Swab     Status: None   Collection Time: 10/30/18  9:46 AM   Specimen: Nasopharyngeal Swab  Result Value Ref Range Status   SARS Coronavirus 2 NEGATIVE NEGATIVE Final    Comment: (NOTE) If result is NEGATIVE SARS-CoV-2 target nucleic acids are NOT DETECTED. The SARS-CoV-2 RNA is generally detectable in upper and lower  respiratory specimens during the acute phase of infection. The lowest  concentration of SARS-CoV-2 viral copies this assay can detect is 250  copies / mL. A negative result does not preclude SARS-CoV-2 infection  and should not be used as the sole basis for treatment or other  patient management decisions.  A negative result may occur with  improper specimen collection / handling, submission of specimen other  than nasopharyngeal swab, presence of viral mutation(s) within the  areas targeted by this assay, and inadequate number of viral  copies  (<250 copies / mL). A negative result must be combined with clinical  observations, patient history, and epidemiological information. If result is POSITIVE SARS-CoV-2 target nucleic acids are DETECTED. The SARS-CoV-2 RNA is generally detectable in upper and lower  respiratory specimens dur ing the acute phase of infection.  Positive  results are indicative of active infection with SARS-CoV-2.  Clinical  correlation with patient history and other diagnostic information is  necessary to determine patient infection status.  Positive results do  not rule out bacterial infection or co-infection with other viruses. If result is PRESUMPTIVE POSTIVE SARS-CoV-2 nucleic acids MAY BE PRESENT.   A presumptive positive result was obtained on the submitted specimen  and confirmed on repeat testing.  While 2019 novel coronavirus  (SARS-CoV-2) nucleic acids may be present in the submitted sample  additional confirmatory testing may be necessary for epidemiological  and / or clinical management purposes  to differentiate between  SARS-CoV-2 and other Sarbecovirus currently known to infect humans.  If clinically indicated additional testing with an alternate test  methodology [phone]) is advised. The SARS-CoV-2 RNA is generally  detectable in upper and lower respiratory sp ecimens during the acute  phase of infection. The expected result is Negative. Fact Sheet for Patients:  StrictlyIdeas.no Fact Sheet for Healthcare Providers: BankingDealers.co.za This test is not yet approved or cleared by the [place] FDA and has been authorized for detection and/or diagnosis of SARS-CoV-2 by FDA under an Emergency Use Authorization (EUA).  This EUA will remain in effect (meaning this test can be used) for the duration of the COVID-19 declaration under Section 564(b)(1) of the Act, 21 U.S.C. section 360bbb-3(b)(1), unless the authorization is terminated  or revoked sooner. Performed at [facility], [address] [place], [place] [address]      Coagulation Studies: No results for input(s): LABPROT, INR in the last 72 hours.  Urinalysis: Recent Labs    10/30/18 1408  COLORURINE AMBER*  LABSPEC 1.014  PHURINE 5.0  GLUCOSEU NEGATIVE  HGBUR NEGATIVE  BILIRUBINUR NEGATIVE  KETONESUR NEGATIVE  PROTEINUR NEGATIVE  NITRITE NEGATIVE  LEUKOCYTESUR TRACE*        Imaging: US Renal  Result Date: 10/30/2018 CLINICAL DATA:  Renal failure, diabetes mellitus, hypertension EXAM: RENAL / URINARY TRACT ULTRASOUND COMPLETE COMPARISON:  CT abdomen and pelvis 07/30/2012 FINDINGS: Right Kidney: Renal measurements: 10.3 x 4.4 x 5.2 cm = volume: 124 mL. Normal cortical thickness. Increased cortical echogenicity. No hydronephrosis or shadowing calcification. Hypoechoic nodule at inferior pole 20 x 16 x 18 mm question cyst, measured 14 mm diameter on prior CT. Additional small hypoechoic nodule at upper to mid RIGHT kidney 17 x 13 x 16 mm, also containing a few scattered internal echoes, question additional cyst. No septations or mural nodularity within the observed cysts. Left Kidney: Renal measurements: 9.4 x 5.7 x 4.5 cm = volume: 125 mL. Cortical thinning period increased cortical echogenicity. No hydronephrosis or shadowing calcification. Small mid renal cyst 21 x 19 x 22 mm. Additional small mid renal cyst 15 x 14 x 12 mm. Both of these of increased in sizes since 2014 CT. No mural nodularity or septations within the observed cysts. Bladder: Appears normal for degree of bladder distention. IMPRESSION: Medical renal disease changes of both kidneys. BILATERAL renal cysts, all of which contain a few scattered internal echoes, suspect artifacts related to body habitus. Electronically Signed   By: Lavonia Dana M.D.   On: 10/30/2018 12:47   Mr Abdomen Mrcp Wo Contrast  Result Date: 10/31/2018 CLINICAL DATA:  Nausea vomiting and RIGHT upper quadrant  pain. Breast cancer history as well as chronic renal disease. Gallbladder sludge in gallstones on ultrasound EXAM: MRI ABDOMEN WITHOUT CONTRAST  (INCLUDING MRCP) TECHNIQUE: Multiplanar multisequence MR imaging of the abdomen was performed. Heavily T2-weighted images of the biliary and pancreatic ducts were obtained, and three-dimensional MRCP images  were rendered by post processing. COMPARISON:  Ultrasound 10/30/2018 FINDINGS: Lower chest:  Lung bases are clear. Hepatobiliary: Normal hepatic parenchymal intensity. No intrahepatic biliary duct dilatation. Common bile duct normal caliber. No filling defect within the common bile duct Gallbladder is minimally distended to 3.7 cm. No gallstones are clearly identified. There is dependent material within the fundus (image 32/8). No gallbladder wall thickening. No pericholecystic fluid. Pancreas: Normal pancreatic parenchymal intensity. No ductal dilatation or inflammation. Spleen: Normal spleen. Adrenals/urinary tract: Adrenal glands normal. Several simple fluid intensity cysts within the kidneys. Stomach/Bowel: Stomach and limited of the small bowel is unremarkable Vascular/Lymphatic: Abdominal aortic normal caliber. No retroperitoneal periportal lymphadenopathy. Musculoskeletal: No aggressive osseous lesion IMPRESSION: 1. Normal biliary tree. No choledocholithiasis or duct dilatation. Common bile duct normal. 2. Small sludge within the gallbladder fundus. No evidence acute cholecystitis. 3. Normal liver parenchyma. 4. Normal pancreas. 5. Benign-appearing cysts in the kidneys.  No IV contrast. Electronically Signed   By: Suzy Bouchard M.D.   On: 10/31/2018 08:50   Mr 3d Recon At Scanner  Result Date: 10/31/2018 CLINICAL DATA:  Nonspecific (abnormal) findings on radiological and other examination of musculoskeletal sysem. EXAM: 3-DIMENSIONAL MR IMAGE RENDERING ON AQUISITION WORKSTATION TECHNIQUE: 3-dimensional MR images were rendered by post-processing of the  original MR data on an aquisition workstation. The 3-dimensional MR images were interpreted and findings were reported in the accompanying complete MR report for this study COMPARISON:  None. FINDINGS: See report accompanying MRI same day IMPRESSION: See report accompanying MRI same day Electronically Signed   By: Suzy Bouchard M.D.   On: 10/31/2018 10:24   US Abdomen Limited Ruq  Result Date: 10/30/2018 CLINICAL DATA:  Transaminitis, hyperbilirubinemia, epigastric pain, reflux, history breast cancer, diabetes mellitus, hypertension EXAM: ULTRASOUND ABDOMEN LIMITED RIGHT UPPER QUADRANT COMPARISON:  CT abdomen and pelvis 07/30/2012 FINDINGS: Gallbladder: Normally distended. Dependent echogenic material with suspect mild posterior shadowing on transverse imaging, question tiny dependent calculi versus less likely sludge. No gallbladder wall thickening, pericholecystic fluid or sonographic Murphy sign. Common bile duct: Diameter: 3 mm, normal Liver: Echogenic parenchyma, likely fatty infiltration though this can be seen with cirrhosis and certain infiltrative disorders. No focal hepatic mass or nodularity. Portal vein is patent on color Doppler imaging with normal direction of blood flow towards the liver. Other: No RIGHT upper quadrant free fluid. IMPRESSION: Probable fatty infiltration of liver as above. Dependent echogenic material within the gallbladder, with a suspicion of posterior shadowing on some of the transverse images, question tiny dependent calculi versus less likely sludge. No sonographic evidence of acute cholecystitis or biliary obstruction. Electronically Signed   By: Lavonia Dana M.D.   On: 10/30/2018 12:44      Assessment & Plan: Pt is a 78 y.o. African-American  female with DM-2, CKD,breast cancer , was admitted on 10/30/2018 with nausea and vomiting.  Underwent lap chole on 8/6  1. ARF on CKD st 3 Baseline Cr 2.1/GFR 28 from 09/24/2018  Renal ultrasound negative for obstruction.   Multiple renal cysts noted AKI likely secondary to ATN Serum creatinine today increased to 3.26 Clinically, patient looks euvolemic and GI symptoms have improved Expect improvement in kidney function in the next few days We will obtain screening serologies Avoid nephrotoxins including nonsteroidals and IV contrast  2. DM-2 / CKD  Lab Results  Component Value Date   HGBA1C 6.7 (H) 10/31/2018   Avoid ACE inhibitor or ARB due to AKI     LOS: 2 Aubriel Khanna 8/7/20209:31 AM  Note: This note was prepared with Dragon dictation. Any transcription errors are unintentional

## 2018-11-02 LAB — BASIC METABOLIC PANEL
Anion gap: 7 (ref 5–15)
BUN: 67 mg/dL — ABNORMAL HIGH (ref 8–23)
CO2: 19 mmol/L — ABNORMAL LOW (ref 22–32)
Calcium: 9 mg/dL (ref 8.9–10.3)
Chloride: 112 mmol/L — ABNORMAL HIGH (ref 98–111)
Creatinine, Ser: 3.78 mg/dL — ABNORMAL HIGH (ref 0.44–1.00)
GFR calc Af Amer: 13 mL/min — ABNORMAL LOW (ref >60)
GFR calc non Af Amer: 11 mL/min — ABNORMAL LOW (ref >60)
Glucose, Bld: 120 mg/dL — ABNORMAL HIGH (ref 70–99)
Potassium: 4.2 mmol/L (ref 3.5–5.1)
Sodium: 138 mmol/L (ref 135–145)

## 2018-11-02 LAB — GLUCOSE, CAPILLARY
Glucose-Capillary: 100 mg/dL — ABNORMAL HIGH (ref 70–99)
Glucose-Capillary: 109 mg/dL — ABNORMAL HIGH (ref 70–99)
Glucose-Capillary: 118 mg/dL — ABNORMAL HIGH (ref 70–99)
Glucose-Capillary: 121 mg/dL — ABNORMAL HIGH (ref 70–99)
Glucose-Capillary: 151 mg/dL — ABNORMAL HIGH (ref 70–99)
Glucose-Capillary: 85 mg/dL (ref 70–99)

## 2018-11-02 MED ORDER — IPRATROPIUM-ALBUTEROL 0.5-2.5 (3) MG/3ML IN SOLN: 3.0000 mL | Freq: Four times a day (QID) | RESPIRATORY_TRACT | Status: DC | PRN

## 2018-11-02 NOTE — Progress Notes (Signed)
[facility], [place] 11/02/18  Subjective:   LOS: 3 08/07 0701 - 08/08 0700 In: 1393.3 [P.O.:480; I.V.:812.4; IV Piggyback:100.9] Out: -  Patient is doing well.  Able to eat without nausea or vomiting No leg edema No shortness of breath Creatinine slightly worse today to 3.8   Objective:  Vital signs in last 24 hours:  Temp:  [98.1 F (36.7 C)-98.4 F (36.9 C)] 98.1 F (36.7 C) (08/08 0420) Pulse Rate:  [77-83] 83 (08/08 0420) Resp:  [18-21] 21 (08/08 0420) BP: (85-115)/(42-51) 115/49 (08/08 0420) SpO2:  [96 %-100 %] 100 % (08/08 0420)  Weight change:  Filed Weights   10/30/18 0923 10/31/18 1251  Weight: 106.6 kg 106.6 kg    Intake/Output:    Intake/Output Summary (Last 24 hours) at 11/02/2018 0934 Last data filed at 11/02/2018 0912 Gross per 24 hour  Intake 1393.3 ml  Output -  Net 1393.3 ml   Physical Exam: General:  No acute distress, obese  HEENT  anicteric, moist oral mucous membranes  Neck:  Supple  Lungs:  Normal breathing effort, clear to auscultation  Heart::  Regular rhythm  Abdomen:  Soft, obese, nontender  Extremities:  No edema  Neurologic:  Alert, oriented  Skin:  No acute rashes     Basic Metabolic Panel:  Recent Labs  Lab 10/30/18 0946 10/31/18 0627 11/01/18 0441 11/02/18 0510  NA 137 138 136 138  K 4.3 4.0 4.6 4.2  CL 104 109 108 112*  CO2 20* 20* 16* 19*  GLUCOSE 90 75 284* 120*  BUN 50* 46* 50* 67*  CREATININE 3.44* 2.95* 3.26* 3.78*  CALCIUM 9.7 9.1 9.0 9.0     CBC: Recent Labs  Lab 10/30/18 0946 10/31/18 0627 11/01/18 0441  WBC 11.9* 6.3 11.3*  NEUTROABS 10.5*  --   --   HGB 10.2* 9.6* 8.8*  HCT 31.2* 29.2* 27.3*  MCV 92.9 92.7 94.5  PLT 201 195 206      Lab Results  Component Value Date   HEPBSAG Negative 10/30/2018   HEPBIGM Negative 10/30/2018      Microbiology:  Recent Results (from the past 240 hour(s))  SARS Coronavirus 2 [facility] order, Performed in [facility]  hospital lab) Nasopharyngeal Nasopharyngeal Swab     Status: None   Collection Time: 10/30/18  9:46 AM   Specimen: Nasopharyngeal Swab  Result Value Ref Range Status   SARS Coronavirus 2 NEGATIVE NEGATIVE Final    Comment: (NOTE) If result is NEGATIVE SARS-CoV-2 target nucleic acids are NOT DETECTED. The SARS-CoV-2 RNA is generally detectable in upper and lower  respiratory specimens during the acute phase of infection. The lowest  concentration of SARS-CoV-2 viral copies this assay can detect is 250  copies / mL. A negative result does not preclude SARS-CoV-2 infection  and should not be used as the sole basis for treatment or other  patient management decisions.  A negative result may occur with  improper specimen collection / handling, submission of specimen other  than nasopharyngeal swab, presence of viral mutation(s) within the  areas targeted by this assay, and inadequate number of viral copies  (<250 copies / mL). A negative result must be combined with clinical  observations, patient history, and epidemiological information. If result is POSITIVE SARS-CoV-2 target nucleic acids are DETECTED. The SARS-CoV-2 RNA is generally detectable in upper and lower  respiratory specimens dur ing the acute phase of infection.  Positive  results are indicative of active infection with SARS-CoV-2.  Clinical  correlation with patient history and other diagnostic information is  necessary to determine patient infection status.  Positive results do  not rule out bacterial infection or co-infection with other viruses. If result is PRESUMPTIVE POSTIVE SARS-CoV-2 nucleic acids MAY BE PRESENT.   A presumptive positive result was obtained on the submitted specimen  and confirmed on repeat testing.  While 2019 novel coronavirus  (SARS-CoV-2) nucleic acids may be present in the submitted sample  additional confirmatory testing may be necessary for epidemiological  and / or clinical management  purposes  to differentiate between  SARS-CoV-2 and other Sarbecovirus currently known to infect humans.  If clinically indicated additional testing with an alternate test  methodology [phone]) is advised. The SARS-CoV-2 RNA is generally  detectable in upper and lower respiratory sp ecimens during the acute  phase of infection. The expected result is Negative. Fact Sheet for Patients:  StrictlyIdeas.no Fact Sheet for Healthcare Providers: BankingDealers.co.za This test is not yet approved or cleared by the [place] FDA and has been authorized for detection and/or diagnosis of SARS-CoV-2 by FDA under an Emergency Use Authorization (EUA).  This EUA will remain in effect (meaning this test can be used) for the duration of the COVID-19 declaration under Section 564(b)(1) of the Act, 21 U.S.C. section 360bbb-3(b)(1), unless the authorization is terminated or revoked sooner. Performed at [facility], [address] [place], [place] [address]     Coagulation Studies: No results for input(s): LABPROT, INR in the last 72 hours.  Urinalysis: Recent Labs    10/30/18 1408  COLORURINE AMBER*  LABSPEC 1.014  PHURINE 5.0  GLUCOSEU NEGATIVE  HGBUR NEGATIVE  BILIRUBINUR NEGATIVE  KETONESUR NEGATIVE  PROTEINUR NEGATIVE  NITRITE NEGATIVE  LEUKOCYTESUR TRACE*      Imaging: US Renal  Result Date: 11/01/2018 CLINICAL DATA:  Acute kidney injury EXAM: RENAL / URINARY TRACT ULTRASOUND COMPLETE COMPARISON:  Renal ultrasound 10/30/2018, MRI 10/30/2018 FINDINGS: Right Kidney: Renal measurements: 9.8 x 3.9 x 0.3 cm = volume: 106 mL. Increased renal cortical echogenicity. No hydronephrosis. No shadowing calcification. Unchanged multiple renal cysts, the largest measuring up to 2.3 cm. Left Kidney: Renal measurements: 9.2 x 4.9 x 4.2 cm = volume: 98 mL. Increased renal cortical echogenicity. No hydronephrosis. No shadowing calcification.  Unchanged multiple renal cysts, largest measuring up to 243 cm. Bladder: Appears normal for degree of bladder distention. IMPRESSION: 1. No evidence of obstructive uropathy. 2. Chronic medical renal disease with multiple bilateral renal cysts, unchanged. Electronically Signed   By: Davina Poke M.D.   On: 11/01/2018 10:54     Medications:   . sodium chloride 75 mL/hr at 11/02/18 0100   . acetaminophen  1,000 mg Oral Q6H  . allopurinol  100 mg Oral BID  . aspirin EC  81 mg Oral Daily  . cholecalciferol  5,000 Units Oral Daily  . ferrous sulfate  325 mg Oral Daily  . heparin  5,000 Units Subcutaneous Q8H  . insulin aspart  0-9 Units Subcutaneous TID WC  . insulin glargine  20 Units Subcutaneous Daily  . multivitamin with minerals  1 tablet Oral Daily  . pantoprazole  40 mg Oral Daily  . vitamin B-12  1,000 mcg Oral Daily  . vitamin E  400 Units Oral Daily   acetaminophen, morphine injection, ondansetron **OR** ondansetron (ZOFRAN) IV, senna-docusate, traMADol  Assessment/ Plan:  78 y.o. female with DM-2, CKD,breast cancer , was admitted on 10/30/2018 with nausea and vomiting.  Underwent lap chole on 8/6  Active Problems:  ARF (acute renal failure) ([place])   #.AKI on  CKD st 3 Recent Labs    10/30/18 0946 10/31/18 0627 11/01/18 0441 11/02/18 0510  CREATININE 3.44* 2.95* 3.26* 3.78*  Serum creatinine worse today but patient clinically looks good States that she has good urine output Encouraged fluid intake Continue to monitor serum creatinine  #. Anemia  Lab Results  Component Value Date   HGB 8.8 (L) 11/01/2018  monitor  #. Diabetes type 2 with CKD Hgb A1c MFr Bld (%)  Date Value  10/31/2018 6.7 (H)  Insulin-dependent.  Avoid ACE inhibitor or ARB for now due to AKI   # Status post laparoscopic cholecystectomy on August 6 Clinically doing well    LOS: [place] 8/8/20209:34 AM  [place], [place]

## 2018-11-02 NOTE — Progress Notes (Signed)
[place] Hospital Day(s): 3.   Post op day(s): 2 Days Post-Op.   Interval History: Patient seen and examined, no acute events or new complaints overnight.  She is doing well from a surgical standpoint.  She had a regular diet today, but says that it was nasty and cold.  Mobilizing well. She did have another bump in her creatinine this morning.  Nephrology is following   Vital signs in last 24 hours: [min-max] current  Temp:  [98.1 F (36.7 C)-98.4 F (36.9 C)] 98.1 F (36.7 C) (08/08 0420) Pulse Rate:  [77-83] 83 (08/08 0420) Resp:  [18-21] 21 (08/08 0420) BP: (85-115)/(42-51) 115/49 (08/08 0420) SpO2:  [96 %-100 %] 100 % (08/08 0420)     Height: 5\' 2"  (157.5 cm) Weight: 106.6 kg BMI (Calculated): 42.97   Intake/Output last 2 shifts:  08/07 0701 - 08/08 0700 In: 1393.3 [P.O.:480; I.V.:812.4; IV Piggyback:100.9] Out: -    Physical Exam:  Constitutional: alert, cooperative and no distress  Respiratory: breathing non-labored at rest  Gastrointestinal: soft, non-tender, and non-distended. No rebound/guarding Integumentary: Laparoscopic incisions are CDI with glue, no erythema, no drainage  Labs:  CBC Latest Ref Rng & Units 11/01/2018 10/31/2018 10/30/2018  WBC 4.0 - 10.5 K/uL 11.3(H) 6.3 11.9(H)  Hemoglobin 12.0 - 15.0 g/dL 8.8(L) 9.6(L) 10.2(L)  Hematocrit 36.0 - 46.0 % 27.3(L) 29.2(L) 31.2(L)  Platelets 150 - 400 K/uL 206 195 201   CMP Latest Ref Rng & Units 11/02/2018 11/01/2018 10/31/2018  Glucose 70 - 99 mg/dL 120(H) 284(H) 75  BUN 8 - 23 mg/dL 67(H) 50(H) 46(H)  Creatinine 0.44 - 1.00 mg/dL 3.78(H) 3.26(H) 2.95(H)  Sodium 135 - 145 mmol/L 138 136 138  Potassium 3.5 - 5.1 mmol/L 4.2 4.6 4.0  Chloride 98 - 111 mmol/L 112(H) 108 109  CO2 22 - 32 mmol/L 19(L) 16(L) 20(L)  Calcium 8.9 - 10.3 mg/dL 9.0 9.0 9.1  Total Protein 6.5 - 8.1 g/dL - 6.2(L) 6.2(L)  Total Bilirubin 0.3 - 1.2 mg/dL - 1.7(H) 2.1(H)  Alkaline Phos 38 - 126 U/L - 169(H)  168(H)  AST 15 - 41 U/L - 97(H) 120(H)  ALT 0 - 44 U/L - 169(H) 226(H)     Imaging studies: No new pertinent imaging studies   Assessment/Plan:  78 y.o. female with an additional increase in her creatinine, otherwise doing well 2 Days Post-Op s/p robotic assisted laparoscopic cholecystectomy for biliary colic with history of jaundice, complicated by pertinent comorbidities including acute on chronic renal failure.   - no further surgical issues, may discharge when stable from a nephrology standpoint  - further management per primary service  All of the above findings and recommendations were discussed with the patient, and the medical team, and all of patient's questions were answered to her expressed satisfaction.

## 2018-11-02 NOTE — Progress Notes (Signed)
[facility] [place] NAME: Alexis Greer    MR#:  [id number]  DATE OF BIRTH:  08-Apr-1940  SUBJECTIVE:   No acute events overnight.  Patient would like to go home however creatinine is increasing.  She has good urine output.  She has shortness of breath however she reports that this is stable for her.  She has shortness of breath at home as well she cannot walk far distances without feeling shortness of breath.  This is her baseline.  REVIEW OF SYSTEMS:    Review of Systems  Constitutional: Negative for fever, chills weight loss HENT: Negative for ear pain, nosebleeds, congestion, facial swelling, rhinorrhea, neck pain, neck stiffness and ear discharge.   Respiratory: Negative for cough, shortness of breath, wheezing  Cardiovascular: Negative for chest pain, palpitations and leg swelling.  Gastrointestinal: Negative for heartburn, abdominal pain, vomiting, diarrhea or consitpation Genitourinary: Negative for dysuria, urgency, frequency, hematuria Musculoskeletal: Negative for back pain or joint pain Neurological: Negative for dizziness, seizures, syncope, focal weakness,  numbness and headaches.  Hematological: Does not bruise/bleed easily.  Psychiatric/Behavioral: Negative for hallucinations, confusion, dysphoric mood    Tolerating Diet: yes      DRUG ALLERGIES:   Allergies  Allergen Reactions  . Oxycodone Other (See Comments)    It made the patient have shakes and jittery     VITALS:  Blood pressure (!) 115/49, pulse 83, temperature 98.1 F (36.7 C), temperature source Oral, resp. rate (!) 21, height 5\' 2"  (1.575 m), weight 106.6 kg, SpO2 100 %.  PHYSICAL EXAMINATION:  Constitutional: Appears well-developed and well-nourished. No distress. HENT: Normocephalic. Marland Kitchen Oropharynx is clear and moist.  Eyes: Conjunctivae and EOM are normal. PERRLA, no scleral icterus.  Neck: Normal ROM. Neck supple. No JVD. No tracheal  deviation. CVS: RRR, S1/S2 +, no murmurs, no gallops, no carotid bruit.  Pulmonary: Effort and breath sounds normal, no stridor, rhonchi, wheezes, rales.  Abdominal: Soft. BS +,  no distension, tenderness, rebound or guarding.  Musculoskeletal: Normal range of motion. No edema and no tenderness.  Neuro: Alert. CN 2-12 grossly intact. No focal deficits. Skin: Skin is warm and dry. No rash noted. Psychiatric: Normal mood and affect.      LABORATORY PANEL:   CBC Recent Labs  Lab 11/01/18 0441  WBC 11.3*  HGB 8.8*  HCT 27.3*  PLT 206   ------------------------------------------------------------------------------------------------------------------  Chemistries  Recent Labs  Lab 11/01/18 0441 11/02/18 0510  NA 136 138  K 4.6 4.2  CL 108 112*  CO2 16* 19*  GLUCOSE 284* 120*  BUN 50* 67*  CREATININE 3.26* 3.78*  CALCIUM 9.0 9.0  AST 97*  --   ALT 169*  --   ALKPHOS 169*  --   BILITOT 1.7*  --    ------------------------------------------------------------------------------------------------------------------  Cardiac Enzymes No results for input(s): TROPONINI in the last 168 hours. ------------------------------------------------------------------------------------------------------------------  RADIOLOGY:  US Renal  Result Date: 11/01/2018 CLINICAL DATA:  Acute kidney injury EXAM: RENAL / URINARY TRACT ULTRASOUND COMPLETE COMPARISON:  Renal ultrasound 10/30/2018, MRI 10/30/2018 FINDINGS: Right Kidney: Renal measurements: 9.8 x 3.9 x 0.3 cm = volume: 106 mL. Increased renal cortical echogenicity. No hydronephrosis. No shadowing calcification. Unchanged multiple renal cysts, the largest measuring up to 2.3 cm. Left Kidney: Renal measurements: 9.2 x 4.9 x 4.2 cm = volume: 98 mL. Increased renal cortical echogenicity. No hydronephrosis. No shadowing calcification. Unchanged multiple renal cysts, largest measuring up to 243 cm. Bladder: Appears normal for degree of bladder  distention. IMPRESSION: 1. No evidence of obstructive uropathy. 2. Chronic medical renal disease with multiple bilateral renal cysts, unchanged. Electronically Signed   By: Davina Poke M.D.   On: 11/01/2018 10:54     ASSESSMENT AND PLAN:   78 year old female with diabetes and chronic kidney disease stage III presented emergency room due to abdominal pain.  1.  Abdominal pain due to biliary colic: Patient is postop day #2 robotic assisted laparoscopic cholecystectomy Doing well and tolerating diet.  2.  Acute on chronic kidney disease stage III:  Acute kidney injury due to ATN.  I expect creatinine to rise to a peak and then decrease with improvement in creatinine. Renal ultrasound shows no evidence of hydronephrosis. Nephrology consultation is greatly appreciated. Continue to hold nephrotoxic medications  3.  Diabetes with hypoglycemia due to acute kidney injury and taking medications: Continue ADA diet with Lantus as recommended by diabetes nurse.   4.  Hypertension: Blood pressures have been low and therefore blood pressure medications have been discontinued for now.  Blood pressure is improving in response to IV fluids.  I will continue to monitor and add blood pressure medications if needed.   5.  Anemia of chronic disease/iron deficiency: Continue on ferrous sulfate    Management plans discussed with the patient and she is in agreement.  CODE STATUS: full  TOTAL TIME TAKING CARE OF THIS PATIENT: 25 minutes.   D/w dr Candiss Norse POSSIBLE D/C 1-2 days,DEPENDING ON CLINICAL CONDITION.   Bettey Costa M.D on 11/02/2018 at 10:05 AM  Between 7am to 6pm - Pager - [phone] After 6pm go to [email or web address] - password [email or web address] [facility]  Office  [phone]  CC: Primary care physician; Juluis Pitch, MD  Note: This dictation was prepared with Dragon dictation along with smaller phrase technology. Any transcriptional errors that result from this process  are unintentional.

## 2018-11-03 LAB — BASIC METABOLIC PANEL
Anion gap: 9 (ref 5–15)
BUN: 66 mg/dL — ABNORMAL HIGH (ref 8–23)
CO2: 15 mmol/L — ABNORMAL LOW (ref 22–32)
Calcium: 9.2 mg/dL (ref 8.9–10.3)
Chloride: 115 mmol/L — ABNORMAL HIGH (ref 98–111)
Creatinine, Ser: 3.06 mg/dL — ABNORMAL HIGH (ref 0.44–1.00)
GFR calc Af Amer: 16 mL/min — ABNORMAL LOW (ref >60)
GFR calc non Af Amer: 14 mL/min — ABNORMAL LOW (ref >60)
Glucose, Bld: 83 mg/dL (ref 70–99)
Potassium: 4.3 mmol/L (ref 3.5–5.1)
Sodium: 139 mmol/L (ref 135–145)

## 2018-11-03 LAB — GLUCOSE, CAPILLARY
Glucose-Capillary: 87 mg/dL (ref 70–99)
Glucose-Capillary: 82 mg/dL (ref 70–99)

## 2018-11-03 MED ORDER — NOVOLIN 70/30 FLEXPEN (70-30) 100 UNIT/ML ~~LOC~~ SUPN: 5.0000 [IU] | PEN_INJECTOR | Freq: Two times a day (BID) | SUBCUTANEOUS | 11 refills | Status: AC

## 2018-11-03 NOTE — Progress Notes (Signed)
[facility], [place] 11/03/18  Subjective:   LOS: 4 08/08 0701 - 08/09 0700 In: 240 [P.O.:240] Out: -  Patient is doing well.  Able to eat without nausea or vomiting No leg edema No shortness of breath Creatinine slightly improved today   Objective:  Vital signs in last 24 hours:  Temp:  [98.7 F (37.1 C)-99 F (37.2 C)] 98.7 F (37.1 C) (08/09 0853) Pulse Rate:  [80-87] 87 (08/09 0853) Resp:  [16-20] 16 (08/09 0853) BP: (119-121)/(57-64) 121/64 (08/09 0853) SpO2:  [97 %-100 %] 100 % (08/09 0853)  Weight change:  Filed Weights   10/30/18 0923 10/31/18 1251  Weight: 106.6 kg 106.6 kg    Intake/Output:   No intake or output data in the 24 hours ending 11/03/18 1317 Physical Exam: General:  No acute distress, obese  HEENT  anicteric, moist oral mucous membranes  Neck:  Supple  Lungs:  Normal breathing effort, clear to auscultation  Heart::  Regular rhythm  Abdomen:  Soft, obese, nontender  Extremities:  No edema  Neurologic:  Alert, oriented  Skin:  No acute rashes     Basic Metabolic Panel:  Recent Labs  Lab 10/30/18 0946 10/31/18 0627 11/01/18 0441 11/02/18 0510 11/03/18 0612  NA 137 138 136 138 139  K 4.3 4.0 4.6 4.2 4.3  CL 104 109 108 112* 115*  CO2 20* 20* 16* 19* 15*  GLUCOSE 90 75 284* 120* 83  BUN 50* 46* 50* 67* 66*  CREATININE 3.44* 2.95* 3.26* 3.78* 3.06*  CALCIUM 9.7 9.1 9.0 9.0 9.2     CBC: Recent Labs  Lab 10/30/18 0946 10/31/18 0627 11/01/18 0441  WBC 11.9* 6.3 11.3*  NEUTROABS 10.5*  --   --   HGB 10.2* 9.6* 8.8*  HCT 31.2* 29.2* 27.3*  MCV 92.9 92.7 94.5  PLT 201 195 206      Lab Results  Component Value Date   HEPBSAG Negative 10/30/2018   HEPBIGM Negative 10/30/2018      Microbiology:  Recent Results (from the past 240 hour(s))  SARS Coronavirus 2 [facility] order, Performed in [facility] hospital lab) Nasopharyngeal Nasopharyngeal Swab     Status: None   Collection Time:  10/30/18  9:46 AM   Specimen: Nasopharyngeal Swab  Result Value Ref Range Status   SARS Coronavirus 2 NEGATIVE NEGATIVE Final    Comment: (NOTE) If result is NEGATIVE SARS-CoV-2 target nucleic acids are NOT DETECTED. The SARS-CoV-2 RNA is generally detectable in upper and lower  respiratory specimens during the acute phase of infection. The lowest  concentration of SARS-CoV-2 viral copies this assay can detect is 250  copies / mL. A negative result does not preclude SARS-CoV-2 infection  and should not be used as the sole basis for treatment or other  patient management decisions.  A negative result may occur with  improper specimen collection / handling, submission of specimen other  than nasopharyngeal swab, presence of viral mutation(s) within the  areas targeted by this assay, and inadequate number of viral copies  (<250 copies / mL). A negative result must be combined with clinical  observations, patient history, and epidemiological information. If result is POSITIVE SARS-CoV-2 target nucleic acids are DETECTED. The SARS-CoV-2 RNA is generally detectable in upper and lower  respiratory specimens dur ing the acute phase of infection.  Positive  results are indicative of active infection with SARS-CoV-2.  Clinical  correlation with patient history and other diagnostic information is  necessary to determine patient infection  status.  Positive results do  not rule out bacterial infection or co-infection with other viruses. If result is PRESUMPTIVE POSTIVE SARS-CoV-2 nucleic acids MAY BE PRESENT.   A presumptive positive result was obtained on the submitted specimen  and confirmed on repeat testing.  While 2019 novel coronavirus  (SARS-CoV-2) nucleic acids may be present in the submitted sample  additional confirmatory testing may be necessary for epidemiological  and / or clinical management purposes  to differentiate between  SARS-CoV-2 and other Sarbecovirus currently known to  infect humans.  If clinically indicated additional testing with an alternate test  methodology [phone]) is advised. The SARS-CoV-2 RNA is generally  detectable in upper and lower respiratory sp ecimens during the acute  phase of infection. The expected result is Negative. Fact Sheet for Patients:  StrictlyIdeas.no Fact Sheet for Healthcare Providers: BankingDealers.co.za This test is not yet approved or cleared by the [place] FDA and has been authorized for detection and/or diagnosis of SARS-CoV-2 by FDA under an Emergency Use Authorization (EUA).  This EUA will remain in effect (meaning this test can be used) for the duration of the COVID-19 declaration under Section 564(b)(1) of the Act, 21 U.S.C. section 360bbb-3(b)(1), unless the authorization is terminated or revoked sooner. Performed at [facility] [address] [place], [place] [address]     Coagulation Studies: No results for input(s): LABPROT, INR in the last 72 hours.  Urinalysis: No results for input(s): COLORURINE, LABSPEC, PHURINE, GLUCOSEU, HGBUR, BILIRUBINUR, KETONESUR, PROTEINUR, UROBILINOGEN, NITRITE, LEUKOCYTESUR in the last 72 hours.  Invalid input(s): APPERANCEUR    Imaging: No results found.   Medications:    . acetaminophen  1,000 mg Oral Q6H  . allopurinol  100 mg Oral BID  . aspirin EC  81 mg Oral Daily  . cholecalciferol  5,000 Units Oral Daily  . ferrous sulfate  325 mg Oral Daily  . heparin  5,000 Units Subcutaneous Q8H  . insulin aspart  0-9 Units Subcutaneous TID WC  . insulin glargine  20 Units Subcutaneous Daily  . multivitamin with minerals  1 tablet Oral Daily  . ondansetron (ZOFRAN) IV  4 mg Intravenous Once  . pantoprazole  40 mg Oral Daily  . vitamin B-12  1,000 mcg Oral Daily  . vitamin E  400 Units Oral Daily   acetaminophen, ipratropium-albuterol, morphine injection, ondansetron **OR** ondansetron (ZOFRAN) IV,  senna-docusate, traMADol  Assessment/ Plan:  78 y.o. female with DM-2, CKD,breast cancer , was admitted on 10/30/2018 with nausea and vomiting.  Underwent lap chole on 8/6  Active Problems:   ARF (acute renal failure) ([place])   #.AKI on  CKD st 3 Recent Labs    10/31/18 0627 11/01/18 0441 11/02/18 0510 11/03/18 0612  CREATININE 2.95* 3.26* 3.78* 3.06*  Serum starting to improve today and patient clinically looks good States that she has good urine output Encouraged fluid intake Continue to monitor serum creatinine Will arrange outpatient follow up  #. Anemia  Lab Results  Component Value Date   HGB 8.8 (L) 11/01/2018  monitor  #. Diabetes type 2 with CKD Hgb A1c MFr Bld (%)  Date Value  10/31/2018 6.7 (H)  Insulin-dependent.  Avoid ACE inhibitor or ARB for now due to AKI   # Status post laparoscopic cholecystectomy on August 6 Clinically doing well    LOS: [place] 8/9/20201:17 [place], [place]

## 2018-11-03 NOTE — Discharge Summary (Signed)
[place] [facility] [place] NAME: Alexis Greer    MR#:  [id number]  DATE OF BIRTH:  07-28-1940  DATE OF ADMISSION:  10/30/2018 ADMITTING PHYSICIAN: Saundra Shelling, MD  DATE OF DISCHARGE: 11/03/2018 10:50 AM  PRIMARY CARE PHYSICIAN: Juluis Pitch, MD    ADMISSION DIAGNOSIS:  Weakness [R53.1] Hypoglycemia [E16.2] Acute renal failure superimposed on chronic kidney disease, unspecified CKD stage, unspecified acute renal failure type ([place]) [N17.9, N18.9]  DISCHARGE DIAGNOSIS:  Active Problems:   ARF (acute renal failure) ([place])   SECONDARY DIAGNOSIS:   Past Medical History:  Diagnosis Date  . Arthritis   . Asthma   . Breast cancer ([place]) 11/17/14   right breast lumpectomy, triple negative, with mammosite tx. Chemo with held due to CVA after lumpectomy  . Chronic kidney disease   . Complication of anesthesia    had a stroke during the breast lumpectomy  . Diabetes mellitus without complication ([place])   . Dyspnea   . Hyperlipidemia   . Hypertension   . Personal history of radiation therapy 2016    mammosite radiation-RIGHT lumpectomy  . Pneumonia   . Stroke [facility]) 2016   post lumpectomy    HOSPITAL COURSE:   78 year old female with diabetes and chronic kidney disease stage III presented emergency room due to abdominal pain.  1.  Abdominal pain due to biliary colic: Patient is postop day #3 robotic assisted laparoscopic cholecystectomy Doing well and tolerating diet.  2.  Acute on chronic kidney disease stage III:  Acute kidney injury due to ATN.   Creatinine has improved.  She will continue to avoid nephrotoxic medications.  She will have follow-up early next week with Dr. Candiss Norse who saw her in consultation last hospital stay.  Renal ultrasound showed no evidence of hydronephrosis.    3.  Diabetes with hypoglycemia due to acute kidney injury and taking medications: Due to acute kidney injury she should not be on oral medications at  this time.  She will follow-up with her PCP.  She will continue ADA diet.   4.  Hypertension: Her blood pressures were very labile in the low/normal range. She will need outpatient follow-up for her blood pressure  For now we advise discontinuation of most of her hypertensive medications.    5.  Anemia of chronic disease/iron deficiency: Continue on ferrous sulfate   DISCHARGE CONDITIONS AND DIET:   Stable discharge diabetic heart healthy diet  CONSULTS OBTAINED:  Treatment Team:  Jules Husbands, MD Murlean Iba, MD  DRUG ALLERGIES:   Allergies  Allergen Reactions  . Oxycodone Other (See Comments)    It made the patient have shakes and jittery     DISCHARGE MEDICATIONS:   Allergies as of 11/03/2018      Reactions   Oxycodone Other (See Comments)   It made the patient have shakes and jittery       Medication List    STOP taking these medications   carvedilol 25 MG tablet Commonly known as: COREG   chlorthalidone 25 MG tablet Commonly known as: HYGROTON   glimepiride 4 MG tablet Commonly known as: AMARYL   valsartan 160 MG tablet Commonly known as: DIOVAN     TAKE these medications   Accu-Chek Aviva Plus test strip Generic drug: glucose blood Notes to patient: Resume home schedule   acetaminophen 500 MG tablet Commonly known as: TYLENOL Take 1,000 mg by mouth every 6 (six) hours as needed for mild pain or headache.   allopurinol  100 MG tablet Commonly known as: ZYLOPRIM Take 100 mg by mouth 2 (two) times daily.   amLODipine 10 MG tablet Commonly known as: NORVASC Take 10 mg by mouth at bedtime.   aspirin EC 81 MG tablet Take 81 mg by mouth daily.   atorvastatin 40 MG tablet Commonly known as: LIPITOR Take 40 mg by mouth daily at 6 PM.   D-5000 125 MCG (5000 UT) Tabs Generic drug: Cholecalciferol Take 5,000 Units by mouth daily.   ezetimibe 10 MG tablet Commonly known as: ZETIA Take 10 mg by mouth daily.   ferrous sulfate 325 (65 FE)  MG tablet Take 325 mg by mouth daily.   Multi-Vitamins Tabs Take 1 tablet by mouth daily.   Muscle Rub 10-15 % Crea Apply 1 application topically as needed for muscle pain.   NovoLIN 70/30 FlexPen (70-30) 100 UNIT/ML PEN Generic drug: Insulin Isophane & Regular Human Inject 5 Units into the skin 2 (two) times daily. What changed:   how much to take  when to take this   omeprazole 20 MG capsule Commonly known as: PRILOSEC Take 40 mg by mouth daily. Takes 2 tablets daily   vitamin B-12 1000 MCG tablet Commonly known as: CYANOCOBALAMIN Take 1,000 mcg by mouth daily.   vitamin E 400 UNIT capsule Take 400 Units by mouth daily.         Today   CHIEF COMPLAINT:  Doing well and ready to go home   VITAL SIGNS:  Blood pressure 121/64, pulse 87, temperature 98.7 F (37.1 C), temperature source Oral, resp. rate 16, height 5\' 2"  (1.575 m), weight 106.6 kg, SpO2 100 %.   REVIEW OF SYSTEMS:  Review of Systems  Constitutional: Negative.  Negative for chills, fever and malaise/fatigue.  HENT: Negative.  Negative for ear discharge, ear pain, hearing loss, nosebleeds and sore throat.   Eyes: Negative.  Negative for blurred vision and pain.  Respiratory: Negative.  Negative for cough, hemoptysis, shortness of breath and wheezing.   Cardiovascular: Negative.  Negative for chest pain, palpitations and leg swelling.  Gastrointestinal: Negative.  Negative for abdominal pain, blood in stool, diarrhea, nausea and vomiting.  Genitourinary: Negative.  Negative for dysuria.  Musculoskeletal: Negative.  Negative for back pain.  Skin: Negative.   Neurological: Negative for dizziness, tremors, speech change, focal weakness, seizures and headaches.  Endo/Heme/Allergies: Negative.  Does not bruise/bleed easily.  Psychiatric/Behavioral: Negative.  Negative for depression, hallucinations and suicidal ideas.     PHYSICAL EXAMINATION:  GENERAL:  78 y.o.-year-old patient lying in the bed  with no acute distress.  NECK:  Supple, no jugular venous distention. No thyroid enlargement, no tenderness.  LUNGS: Normal breath sounds bilaterally, no wheezing, rales,rhonchi  No use of accessory muscles of respiration.  CARDIOVASCULAR: S1, S2 normal. No murmurs, rubs, or gallops.  ABDOMEN: Soft, non-tender, non-distended. Bowel sounds present. No organomegaly or mass.  EXTREMITIES: No pedal edema, cyanosis, or clubbing.  PSYCHIATRIC: The patient is alert and oriented x 3.  SKIN: No obvious rash, lesion, or ulcer.   DATA REVIEW:   CBC Recent Labs  Lab 11/01/18 0441  WBC 11.3*  HGB 8.8*  HCT 27.3*  PLT 206    Chemistries  Recent Labs  Lab 11/01/18 0441  11/03/18 0612  NA 136   < > 139  K 4.6   < > 4.3  CL 108   < > 115*  CO2 16*   < > 15*  GLUCOSE 284*   < > 83  BUN  50*   < > 66*  CREATININE 3.26*   < > 3.06*  CALCIUM 9.0   < > 9.2  AST 97*  --   --   ALT 169*  --   --   ALKPHOS 169*  --   --   BILITOT 1.7*  --   --    < > = values in this interval not displayed.    Cardiac Enzymes No results for input(s): TROPONINI in the last 168 hours.  Microbiology Results  @MICRORSLT48 @  RADIOLOGY:  No results found.    Allergies as of 11/03/2018      Reactions   Oxycodone Other (See Comments)   It made the patient have shakes and jittery       Medication List    STOP taking these medications   carvedilol 25 MG tablet Commonly known as: COREG   chlorthalidone 25 MG tablet Commonly known as: HYGROTON   glimepiride 4 MG tablet Commonly known as: AMARYL   valsartan 160 MG tablet Commonly known as: DIOVAN     TAKE these medications   Accu-Chek Aviva Plus test strip Generic drug: glucose blood Notes to patient: Resume home schedule   acetaminophen 500 MG tablet Commonly known as: TYLENOL Take 1,000 mg by mouth every 6 (six) hours as needed for mild pain or headache.   allopurinol 100 MG tablet Commonly known as: ZYLOPRIM Take 100 mg by mouth 2 (two)  times daily.   amLODipine 10 MG tablet Commonly known as: NORVASC Take 10 mg by mouth at bedtime.   aspirin EC 81 MG tablet Take 81 mg by mouth daily.   atorvastatin 40 MG tablet Commonly known as: LIPITOR Take 40 mg by mouth daily at 6 PM.   D-5000 125 MCG (5000 UT) Tabs Generic drug: Cholecalciferol Take 5,000 Units by mouth daily.   ezetimibe 10 MG tablet Commonly known as: ZETIA Take 10 mg by mouth daily.   ferrous sulfate 325 (65 FE) MG tablet Take 325 mg by mouth daily.   Multi-Vitamins Tabs Take 1 tablet by mouth daily.   Muscle Rub 10-15 % Crea Apply 1 application topically as needed for muscle pain.   NovoLIN 70/30 FlexPen (70-30) 100 UNIT/ML PEN Generic drug: Insulin Isophane & Regular Human Inject 5 Units into the skin 2 (two) times daily. What changed:   how much to take  when to take this   omeprazole 20 MG capsule Commonly known as: PRILOSEC Take 40 mg by mouth daily. Takes 2 tablets daily   vitamin B-12 1000 MCG tablet Commonly known as: CYANOCOBALAMIN Take 1,000 mcg by mouth daily.   vitamin E 400 UNIT capsule Take 400 Units by mouth daily.         Management plans discussed with the patient and she is in agreement. Stable for discharge home  Patient should follow up with dr Candiss Norse  CODE STATUS:     Code Status Orders  (From admission, onward)         Start     Ordered   10/30/18 1513  Full code  Continuous     10/30/18 1512        Code Status History    Date Active Date Inactive Code Status Order ID Comments User Context   12/01/2014 1405 12/02/2014 2036 Full Code [id number]  Loletha Grayer, MD Inpatient   Advance Care Planning Activity      TOTAL TIME TAKING CARE OF THIS PATIENT: 38 minutes.    Note: This dictation was  prepared with Dragon dictation along with smaller phrase technology. Any transcriptional errors that result from this process are unintentional.  Bettey Costa M.D on 11/03/2018 at 11:46 AM  Between 7am to  6pm - Pager - [phone] After 6pm go to [email or web address] - password [email or web address] [facility]  Office  [phone]  CC: Primary care physician; Juluis Pitch, MD

## 2018-11-04 LAB — SURGICAL PATHOLOGY

## 2018-11-05 DIAGNOSIS — N183 Chronic kidney disease, stage 3 (moderate): Secondary | ICD-10-CM | POA: Diagnosis not present

## 2018-11-05 DIAGNOSIS — E1122 Type 2 diabetes mellitus with diabetic chronic kidney disease: Secondary | ICD-10-CM | POA: Diagnosis not present

## 2018-11-05 DIAGNOSIS — N179 Acute kidney failure, unspecified: Secondary | ICD-10-CM | POA: Diagnosis not present

## 2018-11-06 DIAGNOSIS — N179 Acute kidney failure, unspecified: Secondary | ICD-10-CM | POA: Diagnosis not present

## 2018-11-06 DIAGNOSIS — E1129 Type 2 diabetes mellitus with other diabetic kidney complication: Secondary | ICD-10-CM | POA: Diagnosis not present

## 2018-11-06 DIAGNOSIS — N184 Chronic kidney disease, stage 4 (severe): Secondary | ICD-10-CM | POA: Diagnosis not present

## 2018-11-06 DIAGNOSIS — I10 Essential (primary) hypertension: Secondary | ICD-10-CM | POA: Diagnosis not present

## 2018-11-13 ENCOUNTER — Encounter: Payer: Medicare HMO | Admitting: Surgery

## 2018-11-15 ENCOUNTER — Ambulatory Visit (INDEPENDENT_AMBULATORY_CARE_PROVIDER_SITE_OTHER): Payer: Medicare HMO | Admitting: Physician Assistant

## 2018-11-15 ENCOUNTER — Other Ambulatory Visit: Payer: Self-pay

## 2018-11-15 ENCOUNTER — Encounter: Payer: Self-pay | Admitting: Physician Assistant

## 2018-11-15 VITALS — BP 163/81 | HR 121 | Temp 98.0°F | Ht 64.0 in | Wt 223.0 lb

## 2018-11-15 DIAGNOSIS — Z09 Encounter for follow-up examination after completed treatment for conditions other than malignant neoplasm: Secondary | ICD-10-CM

## 2018-11-15 DIAGNOSIS — K819 Cholecystitis, unspecified: Secondary | ICD-10-CM

## 2018-11-15 NOTE — Progress Notes (Signed)
[facility] POST-OP OFFICE VISIT  11/15/2018  HPI: Alexis Greer is a 78 y.o. female 2 weeks s/p laparoscopic cholecystectomy. She is overall doing well. No issues with pain, nausea, emesis, or bowel changes. Not requiring pain medications. Tolerating diet. No issues  Vital signs: BP (!) 163/81   Pulse (!) 121   Temp 98 F (36.7 C) (Skin)   Ht 5\' 4"  (1.626 m)   Wt 223 lb (101.2 kg)   SpO2 94%   BMI 38.28 kg/m    Physical Exam: Constitutional: Well appearing female, NAD Abdomen: Soft, non-tender, non-distended Skin: Laparoscopic incisions are CDI  Assessment/Plan: This is a 78 y.o. female s/p laparoscopic cholecystectomy   - Doing well  - Tylenol for pain  - Okay to submerge wounds  - Complete 4 weeks total lifting restrictions  - Reviewed pathology: chronic cholecystitis  - RTC PRN  -- Edison Simon, PA-C [facility] 11/15/2018, 9:09 AM [id number] M-F: 7am - 4pm

## 2018-11-15 NOTE — Patient Instructions (Signed)
Return as needed.The patient is aware to call back for any questions or concerns.  

## 2018-11-18 ENCOUNTER — Other Ambulatory Visit: Payer: Medicare HMO

## 2018-11-19 ENCOUNTER — Ambulatory Visit: Payer: Medicare HMO

## 2018-11-19 ENCOUNTER — Ambulatory Visit
Admission: RE | Admit: 2018-11-19 | Discharge: 2018-11-19 | Disposition: A | Discharge status: Home / Self Care | Payer: Medicare HMO | Source: Ambulatory Visit | Attending: Surgery | Admitting: Surgery

## 2018-11-19 DIAGNOSIS — Z171 Estrogen receptor negative status [ER-]: Secondary | ICD-10-CM | POA: Diagnosis not present

## 2018-11-19 DIAGNOSIS — C50211 Malignant neoplasm of upper-inner quadrant of right female breast: Secondary | ICD-10-CM

## 2018-11-19 DIAGNOSIS — R928 Other abnormal and inconclusive findings on diagnostic imaging of breast: Secondary | ICD-10-CM | POA: Diagnosis not present

## 2018-11-20 DIAGNOSIS — D649 Anemia, unspecified: Secondary | ICD-10-CM | POA: Diagnosis not present

## 2018-11-20 DIAGNOSIS — Z794 Long term (current) use of insulin: Secondary | ICD-10-CM | POA: Diagnosis not present

## 2018-11-20 DIAGNOSIS — N183 Chronic kidney disease, stage 3 (moderate): Secondary | ICD-10-CM | POA: Diagnosis not present

## 2018-11-20 DIAGNOSIS — Z9049 Acquired absence of other specified parts of digestive tract: Secondary | ICD-10-CM | POA: Diagnosis not present

## 2018-11-20 DIAGNOSIS — I10 Essential (primary) hypertension: Secondary | ICD-10-CM | POA: Diagnosis not present

## 2018-11-20 DIAGNOSIS — N289 Disorder of kidney and ureter, unspecified: Secondary | ICD-10-CM | POA: Diagnosis not present

## 2018-11-20 DIAGNOSIS — E1122 Type 2 diabetes mellitus with diabetic chronic kidney disease: Secondary | ICD-10-CM | POA: Diagnosis not present

## 2018-12-11 ENCOUNTER — Encounter: Payer: Self-pay | Admitting: Surgery

## 2018-12-11 ENCOUNTER — Ambulatory Visit (INDEPENDENT_AMBULATORY_CARE_PROVIDER_SITE_OTHER): Payer: Medicare HMO | Admitting: Surgery

## 2018-12-11 ENCOUNTER — Other Ambulatory Visit: Payer: Self-pay

## 2018-12-11 VITALS — BP 149/82 | HR 134 | Temp 97.7°F | Ht 62.0 in | Wt 224.0 lb

## 2018-12-11 DIAGNOSIS — C50211 Malignant neoplasm of upper-inner quadrant of right female breast: Secondary | ICD-10-CM

## 2018-12-11 DIAGNOSIS — Z171 Estrogen receptor negative status [ER-]: Secondary | ICD-10-CM

## 2018-12-11 NOTE — Patient Instructions (Signed)
The patient has been asked to return to the office in one year with a bilateral diagnostic mammogram. 

## 2018-12-11 NOTE — Progress Notes (Signed)
12/11/2018  History of Present Illness: Alexis Greer is a 78 y.o. female s/p right breast lumpectomy and SLNBx in 11/2014 for right breast cancer.  She reports she had an intraoperative stroke.  She had radiation but reports she did not need chemotherapy.  She presents today for yearly follow up.  She's been doing well, and denies any new masses, skin changes, or nipple changes.  Her mammograms have been negative.  Most recent mammogram on 11/19/18.  Past Medical History: Past Medical History:  Diagnosis Date  . Arthritis   . Asthma   . Breast cancer ([place]) 11/17/14   right breast lumpectomy, triple negative, with mammosite tx. Chemo with held due to CVA after lumpectomy  . Chronic kidney disease   . Complication of anesthesia    had a stroke during the breast lumpectomy  . Diabetes mellitus without complication ([place])   . Dyspnea   . Hyperlipidemia   . Hypertension   . Personal history of radiation therapy 2016    mammosite radiation-RIGHT lumpectomy  . Pneumonia   . Stroke [facility]) 2016   post lumpectomy     Past Surgical History: Past Surgical History:  Procedure Laterality Date  . ABDOMINAL HYSTERECTOMY  1979  . BREAST BIOPSY Right 11/17/2014   Invasive mammory carcinoma  . BREAST BIOPSY Right 12/01/2014   Procedure: BREAST BIOPSY WITH NEEDLE LOCALIZATION;  Surgeon: Christene Lye, MD;  Location: [facility] ORS;  Service: General;  Laterality: Right;  . BREAST LUMPECTOMY Right 2016   INVASIVE MAMMARY CARCINOMA   . BREAST LUMPECTOMY WITH SENTINEL LYMPH NODE BIOPSY Right 12/01/2014   Procedure: BREAST LUMPECTOMY WITH SENTINEL LYMPH NODE BX;  Surgeon: Christene Lye, MD;  Location: [facility] ORS;  Service: General;  Laterality: Right;  . COLONOSCOPY WITH PROPOFOL N/A 06/13/2016   Procedure: COLONOSCOPY WITH PROPOFOL;  Surgeon: Lollie Sails, MD;  Location: [facility] ENDOSCOPY;  Service: Endoscopy;  Laterality: N/A;  . EYE SURGERY    . LOWER EXTREMITY INTERVENTION Left 11/09/2016    Procedure: LOWER EXTREMITY INTERVENTION;  Surgeon: Algernon Huxley, MD;  Location: [facility] CV LAB;  Service: Cardiovascular;  Laterality: Left;  Marland Kitchen MASTECTOMY Right    lumpectomy   . OOPHORECTOMY  2014  . TONSILLECTOMY      Home Medications: Prior to Admission medications   Medication Sig Start Date End Date Taking? Authorizing Provider  ACCU-CHEK AVIVA PLUS test strip  10/11/16  Yes [provider]  acetaminophen (TYLENOL) 500 MG tablet Take 1,000 mg by mouth every 6 (six) hours as needed for mild pain or headache.   Yes [provider]  allopurinol (ZYLOPRIM) 100 MG tablet Take 100 mg by mouth 2 (two) times daily.  09/03/14  Yes [provider]  amLODipine (NORVASC) 10 MG tablet Take 10 mg by mouth at bedtime.  09/03/14  Yes [provider]  aspirin EC 81 MG tablet Take 81 mg by mouth daily.   Yes [provider]  atorvastatin (LIPITOR) 40 MG tablet Take 40 mg by mouth daily at 6 PM.   Yes [provider]  Cholecalciferol (D-5000) 5000 UNITS TABS Take 5,000 Units by mouth daily.    Yes [provider]  ezetimibe (ZETIA) 10 MG tablet Take 10 mg by mouth daily. 10/15/18 10/15/19 Yes [provider]  ferrous sulfate 325 (65 FE) MG tablet Take 325 mg by mouth daily.   Yes [provider]  Insulin Isophane & Regular Human (NOVOLIN 70/30 FLEXPEN) (70-30) 100 UNIT/ML PEN Inject 5  Units into the skin 2 (two) times daily. 11/03/18  Yes Mody, Ulice Bold, MD  Menthol-Methyl Salicylate (MUSCLE RUB) 10-15 % CREA Apply 1 application topically as needed for muscle pain.   Yes [provider]  Multiple Vitamin (MULTI-VITAMINS) TABS Take 1 tablet by mouth daily.    Yes [provider]  omeprazole (PRILOSEC) 20 MG capsule Take 40 mg by mouth daily. Takes 2 tablets daily 10/21/13  Yes [provider]  vitamin B-12 (CYANOCOBALAMIN) 1000 MCG tablet Take 1,000 mcg by mouth daily.   Yes [provider]   vitamin E 400 UNIT capsule Take 400 Units by mouth daily.  04/07/15  Yes [provider]    Allergies: Allergies  Allergen Reactions  . Oxycodone Other (See Comments)    It made the patient have shakes and jittery     Review of Systems: Review of Systems  Constitutional: Negative for chills and fever.  Respiratory: Negative for shortness of breath.   Cardiovascular: Negative for chest pain.  Gastrointestinal: Negative for abdominal pain, nausea and vomiting.  Skin: Negative for rash.    Physical Exam BP (!) 149/82   Pulse (!) 134   Temp 97.7 F (36.5 C) (Skin)   Ht 5\' 2"  (1.575 m)   Wt 224 lb (101.6 kg)   SpO2 94%   BMI 40.97 kg/m  CONSTITUTIONAL: No acute distress HEENT:  Normocephalic, atraumatic, extraocular motion intact. RESPIRATORY:  Lungs are clear, and breath sounds are equal bilaterally. Normal respiratory effort without pathologic use of accessory muscles. CARDIOVASCULAR: Heart is regular without murmurs, gallops, or rubs. BREAST:  Right breast s/p lumpectomy in the upper inner quadrant.  Incision well healed.  No palpable masses, skin changes, or nipple changes.  Right axillary incision well healed. No right axillary or supraclavicular lymphadenopathy.  On the left breast, no palpable masses, skin changes, or nipple changes.  No left axillary or supraclavicular lymphadenopathy. NEUROLOGIC:  Motor and sensation is grossly normal.  Cranial nerves are grossly intact. PSYCH:  Alert and oriented to person, place and time. Affect is normal.  Labs/Imaging: Mammogram 11/19/18: FINDINGS: Stable post lumpectomy changes on the right. No interval findings suspicious for malignancy in either breast.  Mammographic images were processed with CAD.  IMPRESSION: No evidence of malignancy.  RECOMMENDATION: Bilateral diagnostic mammogram in 1 year.  Assessment and Plan: This is a 78 y.o. female s/p right breast lumpectomy and SLNBx in 11/2014.  --Discussed with  patient mammogram results and her exam is reassuring today. --Follow up in 1 year with bilateral diagnostic mammogram.  Face-to-face time spent with the patient and care providers was 15 minutes, with more than 50% of the time spent counseling, educating, and coordinating care of the patient.     Melvyn Neth, [facility]

## 2019-01-03 DIAGNOSIS — Z23 Encounter for immunization: Secondary | ICD-10-CM | POA: Diagnosis not present

## 2019-01-03 DIAGNOSIS — E1122 Type 2 diabetes mellitus with diabetic chronic kidney disease: Secondary | ICD-10-CM | POA: Diagnosis not present

## 2019-01-03 DIAGNOSIS — Z794 Long term (current) use of insulin: Secondary | ICD-10-CM | POA: Diagnosis not present

## 2019-01-03 DIAGNOSIS — I10 Essential (primary) hypertension: Secondary | ICD-10-CM | POA: Diagnosis not present

## 2019-01-03 DIAGNOSIS — N183 Chronic kidney disease, stage 3 unspecified: Secondary | ICD-10-CM | POA: Diagnosis not present

## 2019-01-10 DIAGNOSIS — N1832 Chronic kidney disease, stage 3b: Secondary | ICD-10-CM | POA: Diagnosis not present

## 2019-01-10 DIAGNOSIS — Z794 Long term (current) use of insulin: Secondary | ICD-10-CM | POA: Diagnosis not present

## 2019-01-10 DIAGNOSIS — E1121 Type 2 diabetes mellitus with diabetic nephropathy: Secondary | ICD-10-CM | POA: Diagnosis not present

## 2019-04-11 DIAGNOSIS — M1A9XX0 Chronic gout, unspecified, without tophus (tophi): Secondary | ICD-10-CM | POA: Diagnosis not present

## 2019-04-11 DIAGNOSIS — R351 Nocturia: Secondary | ICD-10-CM | POA: Diagnosis not present

## 2019-04-11 DIAGNOSIS — Z6839 Body mass index (BMI) 39.0-39.9, adult: Secondary | ICD-10-CM | POA: Diagnosis not present

## 2019-04-11 DIAGNOSIS — E669 Obesity, unspecified: Secondary | ICD-10-CM | POA: Diagnosis not present

## 2019-04-11 DIAGNOSIS — Z1331 Encounter for screening for depression: Secondary | ICD-10-CM | POA: Diagnosis not present

## 2019-04-11 DIAGNOSIS — K219 Gastro-esophageal reflux disease without esophagitis: Secondary | ICD-10-CM | POA: Diagnosis not present

## 2019-04-11 DIAGNOSIS — I10 Essential (primary) hypertension: Secondary | ICD-10-CM | POA: Diagnosis not present

## 2019-04-11 DIAGNOSIS — E119 Type 2 diabetes mellitus without complications: Secondary | ICD-10-CM | POA: Diagnosis not present

## 2019-04-11 DIAGNOSIS — E538 Deficiency of other specified B group vitamins: Secondary | ICD-10-CM | POA: Diagnosis not present

## 2019-04-11 DIAGNOSIS — Z08 Encounter for follow-up examination after completed treatment for malignant neoplasm: Secondary | ICD-10-CM | POA: Diagnosis not present

## 2019-04-11 DIAGNOSIS — E785 Hyperlipidemia, unspecified: Secondary | ICD-10-CM | POA: Diagnosis not present

## 2019-04-11 DIAGNOSIS — Z0000 Encounter for general adult medical examination without abnormal findings: Secondary | ICD-10-CM | POA: Diagnosis not present

## 2019-04-22 DIAGNOSIS — N1832 Chronic kidney disease, stage 3b: Secondary | ICD-10-CM | POA: Diagnosis not present

## 2019-04-22 DIAGNOSIS — E782 Mixed hyperlipidemia: Secondary | ICD-10-CM | POA: Diagnosis not present

## 2019-04-22 DIAGNOSIS — K219 Gastro-esophageal reflux disease without esophagitis: Secondary | ICD-10-CM | POA: Diagnosis not present

## 2019-04-22 DIAGNOSIS — E538 Deficiency of other specified B group vitamins: Secondary | ICD-10-CM | POA: Diagnosis not present

## 2019-04-22 DIAGNOSIS — Z794 Long term (current) use of insulin: Secondary | ICD-10-CM | POA: Diagnosis not present

## 2019-04-22 DIAGNOSIS — E1121 Type 2 diabetes mellitus with diabetic nephropathy: Secondary | ICD-10-CM | POA: Diagnosis not present

## 2019-04-22 DIAGNOSIS — E559 Vitamin D deficiency, unspecified: Secondary | ICD-10-CM | POA: Diagnosis not present

## 2019-04-22 DIAGNOSIS — M1A9XX0 Chronic gout, unspecified, without tophus (tophi): Secondary | ICD-10-CM | POA: Diagnosis not present

## 2019-04-29 DIAGNOSIS — Z794 Long term (current) use of insulin: Secondary | ICD-10-CM | POA: Diagnosis not present

## 2019-04-29 DIAGNOSIS — E669 Obesity, unspecified: Secondary | ICD-10-CM | POA: Diagnosis not present

## 2019-04-29 DIAGNOSIS — E1121 Type 2 diabetes mellitus with diabetic nephropathy: Secondary | ICD-10-CM | POA: Diagnosis not present

## 2019-04-29 DIAGNOSIS — N1832 Chronic kidney disease, stage 3b: Secondary | ICD-10-CM | POA: Diagnosis not present

## 2019-04-29 DIAGNOSIS — E1169 Type 2 diabetes mellitus with other specified complication: Secondary | ICD-10-CM | POA: Diagnosis not present

## 2019-08-20 DIAGNOSIS — N1832 Chronic kidney disease, stage 3b: Secondary | ICD-10-CM | POA: Diagnosis not present

## 2019-08-20 DIAGNOSIS — Z794 Long term (current) use of insulin: Secondary | ICD-10-CM | POA: Diagnosis not present

## 2019-08-20 DIAGNOSIS — E1122 Type 2 diabetes mellitus with diabetic chronic kidney disease: Secondary | ICD-10-CM | POA: Diagnosis not present

## 2019-08-27 DIAGNOSIS — E1122 Type 2 diabetes mellitus with diabetic chronic kidney disease: Secondary | ICD-10-CM | POA: Diagnosis not present

## 2019-08-27 DIAGNOSIS — Z794 Long term (current) use of insulin: Secondary | ICD-10-CM | POA: Diagnosis not present

## 2019-08-27 DIAGNOSIS — Z780 Asymptomatic menopausal state: Secondary | ICD-10-CM | POA: Diagnosis not present

## 2019-08-27 DIAGNOSIS — N1832 Chronic kidney disease, stage 3b: Secondary | ICD-10-CM | POA: Diagnosis not present

## 2019-09-02 DIAGNOSIS — Z86010 Personal history of colonic polyps: Secondary | ICD-10-CM | POA: Diagnosis not present

## 2019-09-25 DIAGNOSIS — Z0100 Encounter for examination of eyes and vision without abnormal findings: Secondary | ICD-10-CM | POA: Diagnosis not present

## 2019-09-25 DIAGNOSIS — E119 Type 2 diabetes mellitus without complications: Secondary | ICD-10-CM | POA: Diagnosis not present

## 2019-09-25 DIAGNOSIS — H524 Presbyopia: Secondary | ICD-10-CM | POA: Diagnosis not present

## 2019-10-02 ENCOUNTER — Other Ambulatory Visit: Payer: Self-pay

## 2019-10-02 ENCOUNTER — Ambulatory Visit
Admission: RE | Admit: 2019-10-02 | Discharge: 2019-10-02 | Disposition: A | Discharge status: Home / Self Care | Payer: Medicare HMO | Source: Ambulatory Visit | Attending: Radiation Oncology | Admitting: Radiation Oncology

## 2019-10-02 ENCOUNTER — Encounter: Payer: Self-pay | Admitting: Radiation Oncology

## 2019-10-02 VITALS — BP 156/79 | HR 93 | Temp 97.2°F | Wt 226.0 lb

## 2019-10-02 DIAGNOSIS — Z171 Estrogen receptor negative status [ER-]: Secondary | ICD-10-CM

## 2019-10-02 DIAGNOSIS — Z853 Personal history of malignant neoplasm of breast: Secondary | ICD-10-CM | POA: Insufficient documentation

## 2019-10-02 DIAGNOSIS — Z923 Personal history of irradiation: Secondary | ICD-10-CM | POA: Diagnosis not present

## 2019-10-02 NOTE — Progress Notes (Signed)
Radiation Oncology Follow up Note  Name: Alexis Greer   Date:   10/02/2019 MRN:  [id number] DOB: 1941-02-05    This 79 y.o. female presents to the clinic today for for 4-1/2-year follow-up status post accelerated partial breast radiation to right breast for stage I-invasive mammary carcinoma.  REFERRING PROVIDER: Juluis Pitch, MD  HPI: Patient is a 79 year old female now out over 4 and half years having completed accelerated partial breast radiation to her right breast for stage I invasive mammary carcinoma seen today in routine follow-up she is doing well..  She is not on antiestrogen therapy.  Her last mammogram was close to a year ago was BI-RADS 2 benign which I have reviewed.  COMPLICATIONS OF TREATMENT: none  FOLLOW UP COMPLIANCE: keeps appointments   PHYSICAL EXAM:  BP (!) 156/79 (BP Location: Left Arm, Patient Position: Sitting, Cuff Size: Large)   Pulse 93   Temp (!) 97.2 F (36.2 C) (Tympanic)   Wt 226 lb (102.5 kg)   BMI 41.34 kg/m  Lungs are clear to A&P cardiac examination essentially unremarkable with regular rate and rhythm. No dominant mass or nodularity is noted in either breast in 2 positions examined. Incision is well-healed. No axillary or supraclavicular adenopathy is appreciated. Cosmetic result is excellent.  Well-developed well-nourished patient in NAD. HEENT reveals PERLA, EOMI, discs not visualized.  Oral cavity is clear. No oral mucosal lesions are identified. Neck is clear without evidence of cervical or supraclavicular adenopathy. Lungs are clear to A&P. Cardiac examination is essentially unremarkable with regular rate and rhythm without murmur rub or thrill. Abdomen is benign with no organomegaly or masses noted. Motor sensory and DTR levels are equal and symmetric in the upper and lower extremities. Cranial nerves II through XII are grossly intact. Proprioception is intact. No peripheral adenopathy or edema is identified. No motor or sensory levels are  noted. Crude visual fields are within normal range.  RADIOLOGY RESULTS: Mammograms reviewed compatible with above-stated findings  PLAN: Present time she is now close to 5 years out with no evidence of disease I am going to discontinue follow-up care.  I would be happy to reevaluate this patient in the future should any problems develop.  Patient knows to call at anytime with any concerns.  I would like to take this opportunity to thank you for allowing me to participate in the care of your patient.Noreene Filbert, MD

## 2019-10-10 DIAGNOSIS — E1122 Type 2 diabetes mellitus with diabetic chronic kidney disease: Secondary | ICD-10-CM | POA: Diagnosis not present

## 2019-10-10 DIAGNOSIS — I129 Hypertensive chronic kidney disease with stage 1 through stage 4 chronic kidney disease, or unspecified chronic kidney disease: Secondary | ICD-10-CM | POA: Diagnosis not present

## 2019-10-10 DIAGNOSIS — N1832 Chronic kidney disease, stage 3b: Secondary | ICD-10-CM | POA: Diagnosis not present

## 2019-10-10 DIAGNOSIS — D649 Anemia, unspecified: Secondary | ICD-10-CM | POA: Diagnosis not present

## 2019-10-10 DIAGNOSIS — Z794 Long term (current) use of insulin: Secondary | ICD-10-CM | POA: Diagnosis not present

## 2019-10-10 DIAGNOSIS — E782 Mixed hyperlipidemia: Secondary | ICD-10-CM | POA: Diagnosis not present

## 2019-10-22 ENCOUNTER — Other Ambulatory Visit: Payer: Self-pay

## 2019-10-22 DIAGNOSIS — C50211 Malignant neoplasm of upper-inner quadrant of right female breast: Secondary | ICD-10-CM

## 2019-11-28 ENCOUNTER — Other Ambulatory Visit
Admission: RE | Admit: 2019-11-28 | Discharge: 2019-11-28 | Disposition: A | Discharge status: Home / Self Care | Payer: Medicare HMO | Source: Ambulatory Visit | Attending: Internal Medicine | Admitting: Internal Medicine

## 2019-11-28 ENCOUNTER — Other Ambulatory Visit: Payer: Self-pay

## 2019-11-28 DIAGNOSIS — Z01812 Encounter for preprocedural laboratory examination: Secondary | ICD-10-CM | POA: Insufficient documentation

## 2019-11-28 DIAGNOSIS — Z20822 Contact with and (suspected) exposure to covid-19: Secondary | ICD-10-CM | POA: Insufficient documentation

## 2019-11-28 LAB — SARS CORONAVIRUS 2 (TAT 6-24 HRS): SARS Coronavirus 2: NEGATIVE

## 2019-12-02 ENCOUNTER — Other Ambulatory Visit: Payer: Medicare HMO

## 2019-12-02 ENCOUNTER — Encounter: Payer: Self-pay | Admitting: Internal Medicine

## 2019-12-03 ENCOUNTER — Encounter: Payer: Self-pay | Admitting: Internal Medicine

## 2019-12-03 ENCOUNTER — Ambulatory Visit: Payer: Medicare HMO | Admitting: Anesthesiology

## 2019-12-03 ENCOUNTER — Encounter
Admission: RE | Disposition: A | Discharge status: Home / Self Care | Payer: Self-pay | Source: Home / Self Care | Attending: Internal Medicine

## 2019-12-03 ENCOUNTER — Ambulatory Visit
Admission: RE | Admit: 2019-12-03 | Discharge: 2019-12-03 | Disposition: A | Discharge status: Home / Self Care | Payer: Medicare HMO | Attending: Internal Medicine | Admitting: Internal Medicine

## 2019-12-03 ENCOUNTER — Other Ambulatory Visit: Payer: Self-pay

## 2019-12-03 DIAGNOSIS — Z1211 Encounter for screening for malignant neoplasm of colon: Secondary | ICD-10-CM | POA: Diagnosis not present

## 2019-12-03 DIAGNOSIS — D125 Benign neoplasm of sigmoid colon: Secondary | ICD-10-CM | POA: Diagnosis not present

## 2019-12-03 DIAGNOSIS — J45909 Unspecified asthma, uncomplicated: Secondary | ICD-10-CM | POA: Insufficient documentation

## 2019-12-03 DIAGNOSIS — K5730 Diverticulosis of large intestine without perforation or abscess without bleeding: Secondary | ICD-10-CM | POA: Insufficient documentation

## 2019-12-03 DIAGNOSIS — K640 First degree hemorrhoids: Secondary | ICD-10-CM | POA: Diagnosis not present

## 2019-12-03 DIAGNOSIS — E1122 Type 2 diabetes mellitus with diabetic chronic kidney disease: Secondary | ICD-10-CM | POA: Insufficient documentation

## 2019-12-03 DIAGNOSIS — D122 Benign neoplasm of ascending colon: Secondary | ICD-10-CM | POA: Diagnosis not present

## 2019-12-03 DIAGNOSIS — Z6841 Body Mass Index (BMI) 40.0 and over, adult: Secondary | ICD-10-CM | POA: Insufficient documentation

## 2019-12-03 DIAGNOSIS — M109 Gout, unspecified: Secondary | ICD-10-CM | POA: Insufficient documentation

## 2019-12-03 DIAGNOSIS — E785 Hyperlipidemia, unspecified: Secondary | ICD-10-CM | POA: Diagnosis not present

## 2019-12-03 DIAGNOSIS — Z86010 Personal history of colonic polyps: Secondary | ICD-10-CM | POA: Diagnosis not present

## 2019-12-03 DIAGNOSIS — K635 Polyp of colon: Secondary | ICD-10-CM | POA: Diagnosis not present

## 2019-12-03 DIAGNOSIS — Z853 Personal history of malignant neoplasm of breast: Secondary | ICD-10-CM | POA: Insufficient documentation

## 2019-12-03 DIAGNOSIS — M1990 Unspecified osteoarthritis, unspecified site: Secondary | ICD-10-CM | POA: Diagnosis not present

## 2019-12-03 DIAGNOSIS — Z923 Personal history of irradiation: Secondary | ICD-10-CM | POA: Insufficient documentation

## 2019-12-03 DIAGNOSIS — N189 Chronic kidney disease, unspecified: Secondary | ICD-10-CM | POA: Diagnosis not present

## 2019-12-03 DIAGNOSIS — Z8673 Personal history of transient ischemic attack (TIA), and cerebral infarction without residual deficits: Secondary | ICD-10-CM | POA: Diagnosis not present

## 2019-12-03 DIAGNOSIS — K5790 Diverticulosis of intestine, part unspecified, without perforation or abscess without bleeding: Secondary | ICD-10-CM | POA: Diagnosis not present

## 2019-12-03 DIAGNOSIS — I129 Hypertensive chronic kidney disease with stage 1 through stage 4 chronic kidney disease, or unspecified chronic kidney disease: Secondary | ICD-10-CM | POA: Diagnosis not present

## 2019-12-03 DIAGNOSIS — Z87891 Personal history of nicotine dependence: Secondary | ICD-10-CM | POA: Insufficient documentation

## 2019-12-03 HISTORY — DX: Vitamin D deficiency, unspecified: E55.9

## 2019-12-03 HISTORY — DX: Gout, unspecified: M10.9

## 2019-12-03 HISTORY — DX: Disorder of kidney and ureter, unspecified: N28.9

## 2019-12-03 HISTORY — DX: Malignant (primary) neoplasm, unspecified: C80.1

## 2019-12-03 HISTORY — PX: COLONOSCOPY WITH PROPOFOL: SHX5780

## 2019-12-03 HISTORY — DX: Anemia, unspecified: D64.9

## 2019-12-03 HISTORY — DX: Deficiency of other specified B group vitamins: E53.8

## 2019-12-03 HISTORY — DX: Morbid (severe) obesity due to excess calories: E66.01

## 2019-12-03 LAB — GLUCOSE, CAPILLARY: Glucose-Capillary: 119 mg/dL — ABNORMAL HIGH (ref 70–99)

## 2019-12-03 SURGERY — COLONOSCOPY WITH PROPOFOL
Anesthesia: General

## 2019-12-03 MED ORDER — GLYCOPYRROLATE 0.2 MG/ML IJ SOLN
INTRAMUSCULAR | Status: DC | PRN
  Administered 2019-12-03: .2 mg via INTRAVENOUS

## 2019-12-03 MED ORDER — PROPOFOL 500 MG/50ML IV EMUL
INTRAVENOUS | Status: DC | PRN
  Administered 2019-12-03: 175 ug/kg/min via INTRAVENOUS

## 2019-12-03 MED ORDER — PROPOFOL 10 MG/ML IV BOLUS
INTRAVENOUS | Status: DC | PRN
  Administered 2019-12-03: 70 mg via INTRAVENOUS

## 2019-12-03 MED ORDER — SODIUM CHLORIDE 0.9 % IV SOLN: INTRAVENOUS | Status: DC

## 2019-12-03 MED ORDER — PROPOFOL 500 MG/50ML IV EMUL
INTRAVENOUS | Status: AC
  Filled 2019-12-03: qty 50

## 2019-12-03 NOTE — Anesthesia Postprocedure Evaluation (Signed)
Anesthesia Post Note  Patient: Alexis Greer  Procedure(s) Performed: COLONOSCOPY WITH PROPOFOL (N/A )  Patient location during evaluation: Endoscopy Anesthesia Type: General Level of consciousness: awake and alert Pain management: pain level controlled Vital Signs Assessment: post-procedure vital signs reviewed and stable Respiratory status: spontaneous breathing and respiratory function stable Cardiovascular status: stable Anesthetic complications: no   No complications documented.   Last Vitals:  Vitals:   12/03/19 1200 12/03/19 1210  BP:  118/62  Pulse:    Resp:    Temp: 36.8 C   SpO2:      Last Pain:  Vitals:   12/03/19 1210  TempSrc:   PainSc: 0-No pain                 Kwinton Maahs K

## 2019-12-03 NOTE — Interval H&P Note (Signed)
History and Physical Interval Note:  12/03/2019 11:28 AM  Alexis Greer  has presented today for surgery, with the diagnosis of PRS HX TA POLYPS.  The various methods of treatment have been discussed with the patient and family. After consideration of risks, benefits and other options for treatment, the patient has consented to  Procedure(s): COLONOSCOPY WITH PROPOFOL (N/A) as a surgical intervention.  The patient's history has been reviewed, patient examined, no change in status, stable for surgery.  I have reviewed the patient's chart and labs.  Questions were answered to the patient's satisfaction.     [place], [place]

## 2019-12-03 NOTE — Op Note (Signed)
[facility] Gastroenterology Patient Name: Anarie Kalish Procedure Date: 12/03/2019 11:29 AM MRN: [id number] Account #: [id number] Date of Birth: 18-Jan-1941 Admit Type: Outpatient Age: 79 Room: [facility] ENDO ROOM 3 Gender: Female Note Status: Finalized Procedure:             Colonoscopy Indications:           Surveillance: Personal history of adenomatous polyps                         on last colonoscopy > 3 years ago Providers:             Lorie Apley K. Alice Reichert MD, MD Referring MD:          Youlanda Roys. Lovie Macadamia, MD (Referring MD) Medicines:             Propofol per Anesthesia Complications:         No immediate complications. Procedure:             Pre-Anesthesia Assessment:                        - The risks and benefits of the procedure and the                         sedation options and risks were discussed with the                         patient. All questions were answered and informed                         consent was obtained.                        - Patient identification and proposed procedure were                         verified prior to the procedure by the nurse. The                         procedure was verified in the procedure room.                        - ASA Grade Assessment: III - A patient with severe                         systemic disease.                        - After reviewing the risks and benefits, the patient                         was deemed in satisfactory condition to undergo the                         procedure.                        After obtaining informed consent, the colonoscope was                         passed under direct  vision. Throughout the procedure,                         the patient's blood pressure, pulse, and oxygen                         saturations were monitored continuously. The                         Colonoscope was introduced through the anus and                         advanced to the the cecum,  identified by appendiceal                         orifice and ileocecal valve. The colonoscopy was                         performed without difficulty. The patient tolerated                         the procedure well. The quality of the bowel                         preparation was adequate. The ileocecal valve,                         appendiceal orifice, and rectum were photographed. Findings:      The perianal and digital rectal examinations were normal. Pertinent       negatives include normal sphincter tone and no palpable rectal lesions.      Many small and large-mouthed diverticula were found in the left colon.      Two sessile polyps were found in the sigmoid colon and ascending colon.       The polyps were 4 to 6 mm in size. These polyps were removed with a       jumbo cold forceps. Resection and retrieval were complete.      Non-bleeding internal hemorrhoids were found during retroflexion. The       hemorrhoids were Grade I (internal hemorrhoids that do not prolapse).      The exam was otherwise without abnormality. Impression:            - Diverticulosis in the left colon.                        - Two 4 to 6 mm polyps in the sigmoid colon and in the                         ascending colon, removed with a jumbo cold forceps.                         Resected and retrieved.                        - Non-bleeding internal hemorrhoids.                        - The examination was otherwise normal. Recommendation:        - Patient  has a contact number available for                         emergencies. The signs and symptoms of potential                         delayed complications were discussed with the patient.                         Return to normal activities tomorrow. Written                         discharge instructions were provided to the patient.                        - Resume previous diet.                        - Continue present medications.                        -  Await pathology results.                        - If polyps are benign or adenomatous without                         dysplasia, I will advise NO further colonoscopy due to                         advanced age and/or severe comorbidity.                        - You do NOT require further colon cancer screening                         measures (Annual stool testing (i.e. hemoccult, FIT,                         cologuard), sigmoidoscopy, colonoscopy or CT                         colonography). You should share this recommendation                         with your Primary Care provider.                        - Return to GI office PRN.                        - The findings and recommendations were discussed with                         the patient. Procedure Code(s):     --- Professional ---                        [id number], Colonoscopy, flexible; with biopsy, single or  multiple Diagnosis Code(s):     --- Professional ---                        K57.30, Diverticulosis of large intestine without                         perforation or abscess without bleeding                        K63.5, Polyp of colon                        K64.0, First degree hemorrhoids                        Z86.010, Personal history of colonic polyps CPT copyright 2019 American Medical Association. All rights reserved. The codes documented in this report are preliminary and upon coder review may  be revised to meet current compliance requirements. Efrain Sella MD, MD 12/03/2019 11:50:52 AM This report has been signed electronically. Number of Addenda: 0 Note Initiated On: 12/03/2019 11:29 AM Scope Withdrawal Time: 0 hours 9 minutes 24 seconds  Total Procedure Duration: 0 hours 12 minutes 8 seconds  Estimated Blood Loss:  Estimated blood loss: none.      [facility]

## 2019-12-03 NOTE — Transfer of Care (Signed)
Immediate Anesthesia Transfer of Care Note  Patient: Milas Gain Spegal  Procedure(s) Performed: COLONOSCOPY WITH PROPOFOL (N/A )  Patient Location: PACU  Anesthesia Type:General  Level of Consciousness: awake and alert   Airway & Oxygen Therapy: Patient Spontanous Breathing and Patient connected to face mask oxygen  Post-op Assessment: Report given to RN and Post -op Vital signs reviewed and stable  Post vital signs: Reviewed and stable  Last Vitals:  Vitals Value Taken Time  BP 72/54 12/03/19 1154  Temp    Pulse 97 12/03/19 1155  Resp 28 12/03/19 1155  SpO2 99 % 12/03/19 1155  Vitals shown include unvalidated device data.  Last Pain:  Vitals:   12/03/19 1055  TempSrc: Temporal  PainSc: 0-No pain         Complications: No complications documented.

## 2019-12-03 NOTE — Anesthesia Preprocedure Evaluation (Signed)
Anesthesia Evaluation  Patient identified by MRN, date of birth, ID band Patient awake    Reviewed: Allergy & Precautions, NPO status , Patient's Chart, lab work & pertinent test results  History of Anesthesia Complications (+) history of anesthetic complications (? TIA with breast surgery)  Airway Mallampati: III       Dental  (+) Upper Dentures, Lower Dentures   Pulmonary neg sleep apnea (resolved), neg COPD, Not current smoker, former smoker,           Cardiovascular hypertension, Pt. on medications (-) Past MI and (-) CHF (-) dysrhythmias (-) Valvular Problems/Murmurs     Neuro/Psych neg Seizures CVA (weak knees, ), Residual Symptoms    GI/Hepatic Neg liver ROS, GERD  Medicated and Controlled,  Endo/Other  diabetes, Type 2, Oral Hypoglycemic AgentsMorbid obesity  Renal/GU Renal InsufficiencyRenal disease     Musculoskeletal   Abdominal   Peds  Hematology   Anesthesia Other Findings   Reproductive/Obstetrics                            Anesthesia Physical Anesthesia Plan  ASA: III  Anesthesia Plan: General   Post-op Pain Management:    Induction: Intravenous  PONV Risk Score and Plan: 3 and Propofol infusion, TIVA and Treatment may vary due to age or medical condition  Airway Management Planned: Nasal Cannula  Additional Equipment:   Intra-op Plan:   Post-operative Plan:   Informed Consent: I have reviewed the patients History and Physical, chart, labs and discussed the procedure including the risks, benefits and alternatives for the proposed anesthesia with the patient or authorized representative who has indicated his/her understanding and acceptance.       Plan Discussed with:   Anesthesia Plan Comments:         Anesthesia Quick Evaluation

## 2019-12-03 NOTE — H&P (Signed)
Outpatient short stay form Pre-procedure 12/03/2019 11:27 AM Alexis Greer Alexis Greer, M.D.  Primary Physician: Juluis Pitch, M.D.  Reason for visit: Personal history of tubular adenomas of the colon (2018)  History of present illness:                           Patient presents for colonoscopy for a personal hx of colon polyps. The patient denies abdominal pain, abnormal weight loss or rectal bleeding.      Current Facility-Administered Medications:  .  0.9 %  sodium chloride infusion, , Intravenous, Continuous, Clint, Benay Pike, MD, Last Rate: 20 mL/hr at 12/03/19 1112, New Bag at 12/03/19 1112  Facility-Administered Medications Prior to Admission  Medication Dose Route Frequency Provider Last Rate Last Admin  . ondansetron (ZOFRAN) injection 4 mg  4 mg Intravenous Once Algernon Huxley, MD       Medications Prior to Admission  Medication Sig Dispense Refill Last Dose  . Cholecalciferol (D-5000) 5000 UNITS TABS Take 5,000 Units by mouth daily.    Past Week at Unknown time  . Insulin Isophane & Regular Human (NOVOLIN 70/30 FLEXPEN) (70-30) 100 UNIT/ML PEN Inject 5 Units into the skin 2 (two) times daily. 15 mL 11 12/02/2019 at Unknown time  . losartan (COZAAR) 25 MG tablet Take 25 mg by mouth daily.   12/03/2019 at 0600  . magnesium oxide (MAG-OX) 400 MG tablet Take 1 tablet by mouth 2 (two) times daily.   Past Week at Unknown time  . Multiple Vitamin (MULTI-VITAMINS) TABS Take 1 tablet by mouth daily.    Past Week at Unknown time  . vitamin B-12 (CYANOCOBALAMIN) 1000 MCG tablet Take 1,000 mcg by mouth daily.   Past Week at Unknown time  . vitamin E 400 UNIT capsule Take 400 Units by mouth daily.    Past Week at Unknown time  . ACCU-CHEK AVIVA PLUS test strip      . acetaminophen (TYLENOL) 500 MG tablet Take 1,000 mg by mouth every 6 (six) hours as needed for mild pain or headache.     . allopurinol (ZYLOPRIM) 100 MG tablet Take 100 mg by mouth 2 (two) times daily.      Marland Kitchen amLODipine (NORVASC) 10  MG tablet Take 10 mg by mouth at bedtime.    12/01/2019  . aspirin EC 81 MG tablet Take 81 mg by mouth daily.   11/28/2019  . atorvastatin (LIPITOR) 40 MG tablet Take 40 mg by mouth daily at 6 PM.   11/28/2019  . ezetimibe (ZETIA) 10 MG tablet Take 10 mg by mouth daily.     . ferrous sulfate 325 (65 FE) MG tablet Take 325 mg by mouth daily. (Patient not taking: Reported on 12/03/2019)   Not Taking at Unknown time  . Menthol-Methyl Salicylate (MUSCLE RUB) 10-15 % CREA Apply 1 application topically as needed for muscle pain.     Marland Kitchen omeprazole (PRILOSEC) 20 MG capsule Take 40 mg by mouth daily. Takes 2 tablets daily   11/30/2019     Allergies  Allergen Reactions  . Oxycodone Other (See Comments)    It made the patient have shakes and jittery      Past Medical History:  Diagnosis Date  . Anemia   . Arthritis   . Asthma   . B12 deficiency   . Breast cancer ([place]) 11/17/14   right breast lumpectomy, triple negative, with mammosite tx. Chemo with held due to CVA after lumpectomy  .  Chronic kidney disease   . Complication of anesthesia    had a stroke during the breast lumpectomy  . Diabetes mellitus without complication ([place])   . Dyspnea   . Gout   . Hyperlipidemia   . Hypertension   . Malignant neoplasm ([place])   . Morbid obesity ([place])   . Personal history of radiation therapy 2016    mammosite radiation-RIGHT lumpectomy  . Pneumonia   . Renal insufficiency   . Stroke [facility]) 2016   post lumpectomy  . Vitamin D deficiency     Review of systems:  Otherwise negative.    Physical Exam  Gen: Alert, oriented. Appears stated age.  HEENT: [place]/AT. PERRLA. Lungs: CTA, no wheezes. CV: RR nl S1, S2. Abd: soft, benign, no masses. BS+ Ext: No edema. Pulses 2+    Planned procedures: Proceed with colonoscopy. The patient understands the nature of the planned procedure, indications, risks, alternatives and potential complications including but not limited to bleeding, infection, perforation, damage  to internal organs and possible oversedation/side effects from anesthesia. The patient agrees and gives consent to proceed.  Please refer to procedure notes for findings, recommendations and patient disposition/instructions.     Sajan Cheatwood Alexis Greer, M.D. Gastroenterology 12/03/2019  11:27 AM

## 2019-12-04 ENCOUNTER — Encounter: Payer: Self-pay | Admitting: Internal Medicine

## 2019-12-04 LAB — SURGICAL PATHOLOGY

## 2019-12-05 ENCOUNTER — Ambulatory Visit
Admission: RE | Admit: 2019-12-05 | Discharge: 2019-12-05 | Disposition: A | Discharge status: Home / Self Care | Payer: Medicare HMO | Source: Ambulatory Visit | Attending: Surgery | Admitting: Surgery

## 2019-12-05 DIAGNOSIS — Z171 Estrogen receptor negative status [ER-]: Secondary | ICD-10-CM | POA: Insufficient documentation

## 2019-12-05 DIAGNOSIS — C50211 Malignant neoplasm of upper-inner quadrant of right female breast: Secondary | ICD-10-CM | POA: Diagnosis not present

## 2019-12-05 DIAGNOSIS — R928 Other abnormal and inconclusive findings on diagnostic imaging of breast: Secondary | ICD-10-CM | POA: Diagnosis not present

## 2019-12-10 ENCOUNTER — Other Ambulatory Visit: Payer: Self-pay

## 2019-12-10 ENCOUNTER — Ambulatory Visit: Payer: Medicare HMO | Admitting: Surgery

## 2019-12-10 ENCOUNTER — Encounter: Payer: Self-pay | Admitting: Surgery

## 2019-12-10 VITALS — BP 160/84 | HR 102 | Temp 98.6°F | Ht 62.0 in | Wt 223.0 lb

## 2019-12-10 DIAGNOSIS — C50211 Malignant neoplasm of upper-inner quadrant of right female breast: Secondary | ICD-10-CM

## 2019-12-10 DIAGNOSIS — Z171 Estrogen receptor negative status [ER-]: Secondary | ICD-10-CM | POA: Diagnosis not present

## 2019-12-10 NOTE — Progress Notes (Signed)
12/10/2019  History of Present Illness: Alexis Greer is a 79 y.o. female s/p right breast lumpectomy and SLNBx in 11/2014 for breast cancer.  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  She completed adjuvant radiation.  She reports today that she's doing well.  Her knees do bother her some and she uses a cane for support.  She feels this is residual from the stroke she had.  Otherwise denies any breast pain, masses, or nipple changes.  She had a mammogram on 12/05/19 which did not show any suspicious findings.    Past Medical History: Past Medical History:  Diagnosis Date  . Anemia   . Arthritis   . Asthma   . B12 deficiency   . Breast cancer ([place]) 11/17/14   right breast lumpectomy, triple negative, with mammosite tx. Chemo with held due to CVA after lumpectomy  . Chronic kidney disease   . Complication of anesthesia    had a stroke during the breast lumpectomy  . Diabetes mellitus without complication ([place])   . Dyspnea   . Gout   . Hyperlipidemia   . Hypertension   . Malignant neoplasm ([place])   . Morbid obesity ([place])   . Personal history of radiation therapy 2016    mammosite radiation-RIGHT lumpectomy  . Pneumonia   . Renal insufficiency   . Stroke [facility]) 2016   post lumpectomy  . Vitamin D deficiency      Past Surgical History: Past Surgical History:  Procedure Laterality Date  . ABDOMINAL HYSTERECTOMY  1979  . BREAST BIOPSY Right 11/17/2014   Invasive mammory carcinoma  . BREAST BIOPSY Right 12/01/2014   Procedure: BREAST BIOPSY WITH NEEDLE LOCALIZATION;  Surgeon: Christene Lye, MD;  Location: [facility] ORS;  Service: General;  Laterality: Right;  . BREAST LUMPECTOMY Right 2016   INVASIVE MAMMARY CARCINOMA   . BREAST LUMPECTOMY WITH SENTINEL LYMPH NODE BIOPSY Right 12/01/2014   Procedure: BREAST LUMPECTOMY WITH SENTINEL LYMPH NODE BX;  Surgeon: Christene Lye, MD;  Location: [facility] ORS;  Service: General;  Laterality: Right;  . COLONOSCOPY WITH PROPOFOL  N/A 06/13/2016   Procedure: COLONOSCOPY WITH PROPOFOL;  Surgeon: Lollie Sails, MD;  Location: [facility] ENDOSCOPY;  Service: Endoscopy;  Laterality: N/A;  . COLONOSCOPY WITH PROPOFOL N/A 12/03/2019   Procedure: COLONOSCOPY WITH PROPOFOL;  Surgeon: [place], Benay Pike, MD;  Location: [facility] ENDOSCOPY;  Service: Gastroenterology;  Laterality: N/A;  . EYE SURGERY    . LOWER EXTREMITY INTERVENTION Left 11/09/2016   Procedure: LOWER EXTREMITY INTERVENTION;  Surgeon: Algernon Huxley, MD;  Location: [facility] CV LAB;  Service: Cardiovascular;  Laterality: Left;  Marland Kitchen MASTECTOMY Right    lumpectomy   . OOPHORECTOMY  2014  . TONSILLECTOMY      Home Medications: Prior to Admission medications   Medication Sig Start Date End Date Taking? Authorizing Provider  ACCU-CHEK AVIVA PLUS test strip  10/11/16  Yes [provider]  acetaminophen (TYLENOL) 500 MG tablet Take 1,000 mg by mouth every 6 (six) hours as needed for mild pain or headache.   Yes [provider]  allopurinol (ZYLOPRIM) 100 MG tablet Take 100 mg by mouth 2 (two) times daily.  09/03/14  Yes [provider]  amLODipine (NORVASC) 10 MG tablet Take 10 mg by mouth at bedtime.  09/03/14  Yes [provider]  aspirin EC 81 MG tablet Take 81 mg by mouth daily.   Yes [provider]  atorvastatin (LIPITOR) 40 MG tablet Take 40 mg by mouth daily  at 6 PM.   Yes [provider]  Cholecalciferol (D-5000) 5000 UNITS TABS Take 5,000 Units by mouth daily.    Yes [provider]  DROPLET PEN NEEDLES 31G X 5 MM [place]  11/24/19  Yes [provider]  ferrous sulfate 325 (65 FE) MG tablet Take 325 mg by mouth daily.    Yes [provider]  Insulin Isophane & Regular Human (NOVOLIN 70/30 FLEXPEN) (70-30) 100 UNIT/ML PEN Inject 5 Units into the skin 2 (two) times daily. 11/03/18  Yes Mody, Ulice Bold, MD  losartan (COZAAR) 25 MG tablet Take 25 mg by mouth daily.    Yes [provider]  magnesium  oxide (MAG-OX) 400 MG tablet Take 1 tablet by mouth 2 (two) times daily. 09/25/19  Yes [provider]  Menthol-Methyl Salicylate (MUSCLE RUB) 10-15 % CREA Apply 1 application topically as needed for muscle pain.   Yes [provider]  Multiple Vitamin (MULTI-VITAMINS) TABS Take 1 tablet by mouth daily.    Yes [provider]  omeprazole (PRILOSEC) 20 MG capsule Take 40 mg by mouth daily. Takes 2 tablets daily 10/21/13  Yes [provider]  vitamin B-12 (CYANOCOBALAMIN) 1000 MCG tablet Take 1,000 mcg by mouth daily.   Yes [provider]  vitamin E 400 UNIT capsule Take 400 Units by mouth daily.  04/07/15  Yes [provider]  ezetimibe (ZETIA) 10 MG tablet Take 10 mg by mouth daily. 10/15/18 10/15/19  [provider]    Allergies: Allergies  Allergen Reactions  . Oxycodone Other (See Comments)    It made the patient have shakes and jittery     Review of Systems: Review of Systems  Constitutional: Negative for chills and fever.  Respiratory: Negative for shortness of breath.   Cardiovascular: Negative for chest pain.  Gastrointestinal: Negative for nausea and vomiting.  Skin: Negative for rash.    Physical Exam BP (!) 160/84   Pulse (!) 102   Temp 98.6 F (37 C) (Oral)   Ht 5\' 2"  (1.575 m)   Wt 223 lb (101.2 kg)   SpO2 97%   BMI 40.79 kg/m  CONSTITUTIONAL: No acute distress HEENT:  Normocephalic, atraumatic, extraocular motion intact. RESPIRATORY:  Lungs are clear, and breath sounds are equal bilaterally. Normal respiratory effort without pathologic use of accessory muscles. CARDIOVASCULAR: Heart is regular without murmurs, gallops, or rubs. BREAST:  Right breast s/p lumpectomy with well healed scar.  No palpable masses, skin changes, or nipple changes.  Right axilla s/p SLNBx, with scar well healed.  No right axillary lymphadenopathy.  Left breast without any palpable masses, skin changes, or nipple changes.  No left  axillary lymphadenopathy.   NEUROLOGIC:  Motor and sensation is grossly normal.  Cranial nerves are grossly intact. PSYCH:  Alert and oriented to person, place and time. Affect is normal.  Labs/Imaging: Mammogram 12/05/19: FINDINGS: There is density and architectural distortion within the RIGHT breast, consistent with postsurgical changes. These are stable in comparison to prior. No suspicious mass, distortion, or microcalcifications are identified to suggest presence of malignancy.  Mammographic images were processed with CAD.  IMPRESSION: No mammographic evidence of malignancy.  RECOMMENDATION: Screening mammogram in one year  Assessment and Plan: This is a 79 y.o. female s/p right breast lumpectomy and SLNBx for breast cancer in 11/2014.  --Discussed with the patient the findings on recent mammogram.  Her exam today is reassuring as well, and she's progressing well, now 5 years out from surgery.  Discussed with  her that at this point, it's ok to transition back to her PCP for mammograms and exams.  Discussed with her that if any issues arise or she has any concerns, we're always available to see her. --Follow up prn.  Face-to-face time spent with the patient and care providers was 15 minutes, with more than 50% of the time spent counseling, educating, and coordinating care of the patient.     Melvyn Neth, [facility]

## 2019-12-10 NOTE — Patient Instructions (Addendum)
Follow-up with our office as needed.  Please call and ask to speak with a nurse if you develop questions or concerns.  Breast Self-Awareness Breast self-awareness is knowing how your breasts look and feel. Doing breast self-awareness is important. It allows you to catch a breast problem early while it is still small and can be treated. All women should do breast self-awareness, including women who have had breast implants. Tell your doctor if you notice a change in your breasts. What you need:  A mirror.  A well-lit room. How to do a breast self-exam A breast self-exam is one way to learn what is normal for your breasts and to check for changes. To do a breast self-exam: Look for changes  1. Take off all the clothes above your waist. 2. Stand in front of a mirror in a room with good lighting. 3. Put your hands on your hips. 4. Push your hands down. 5. Look at your breasts and nipples in the mirror to see if one breast or nipple looks different from the other. Check to see if: ? The shape of one breast is different. ? The size of one breast is different. ? There are wrinkles, dips, and bumps in one breast and not the other. 6. Look at each breast for changes in the skin, such as: ? Redness. ? Scaly areas. 7. Look for changes in your nipples, such as: ? Liquid around the nipples. ? Bleeding. ? Dimpling. ? Redness. ? A change in where the nipples are. Feel for changes  1. Lie on your back on the floor. 2. Feel each breast. To do this, follow these steps: ? Pick a breast to feel. ? Put the arm closest to that breast above your head. ? Use your other arm to feel the nipple area of your breast. Feel the area with the pads of your three middle fingers by making small circles with your fingers. For the first circle, press lightly. For the second circle, press harder. For the third circle, press even harder. ? Keep making circles with your fingers at the different pressures as you move  down your breast. Stop when you feel your ribs. ? Move your fingers a little toward the center of your body. ? Start making circles with your fingers again, this time going up until you reach your collarbone. ? Keep making up-and-down circles until you reach your armpit. Remember to keep using the three pressures. ? Feel the other breast in the same way. 3. Sit or stand in the tub or shower. 4. With soapy water on your skin, feel each breast the same way you did in step 2 when you were lying on the floor. Write down what you find Writing down what you find can help you remember what to tell your doctor. Write down:  What is normal for each breast.  Any changes you find in each breast, including: ? The kind of changes you find. ? Whether you have pain. ? Size and location of any lumps.  When you last had your menstrual period. General tips  Check your breasts every month.  If you are breastfeeding, the best time to check your breasts is after you feed your baby or after you use a breast pump.  If you get menstrual periods, the best time to check your breasts is 5-7 days after your menstrual period is over.  With time, you will become comfortable with the self-exam, and you will begin to know if there  are changes in your breasts. Contact a doctor if you:  See a change in the shape or size of your breasts or nipples.  See a change in the skin of your breast or nipples, such as red or scaly skin.  Have fluid coming from your nipples that is not normal.  Find a lump or thick area that was not there before.  Have pain in your breasts.  Have any concerns about your breast health. Summary  Breast self-awareness includes looking for changes in your breasts, as well as feeling for changes within your breasts.  Breast self-awareness should be done in front of a mirror in a well-lit room.  You should check your breasts every month. If you get menstrual periods, the best time to  check your breasts is 5-7 days after your menstrual period is over.  Let your doctor know of any changes you see in your breasts, including changes in size, changes on the skin, pain or tenderness, or fluid from your nipples that is not normal. This information is not intended to replace advice given to you by your health care provider. Make sure you discuss any questions you have with your health care provider. Document Revised: 10/30/2017 Document Reviewed: 10/30/2017 Elsevier Patient Education  [place].

## 2019-12-23 DIAGNOSIS — N1832 Chronic kidney disease, stage 3b: Secondary | ICD-10-CM | POA: Diagnosis not present

## 2019-12-23 DIAGNOSIS — D649 Anemia, unspecified: Secondary | ICD-10-CM | POA: Diagnosis not present

## 2019-12-23 DIAGNOSIS — Z794 Long term (current) use of insulin: Secondary | ICD-10-CM | POA: Diagnosis not present

## 2019-12-23 DIAGNOSIS — E1122 Type 2 diabetes mellitus with diabetic chronic kidney disease: Secondary | ICD-10-CM | POA: Diagnosis not present

## 2019-12-30 DIAGNOSIS — E213 Hyperparathyroidism, unspecified: Secondary | ICD-10-CM | POA: Diagnosis not present

## 2019-12-30 DIAGNOSIS — Z794 Long term (current) use of insulin: Secondary | ICD-10-CM | POA: Diagnosis not present

## 2019-12-30 DIAGNOSIS — N1832 Chronic kidney disease, stage 3b: Secondary | ICD-10-CM | POA: Diagnosis not present

## 2019-12-30 DIAGNOSIS — E1122 Type 2 diabetes mellitus with diabetic chronic kidney disease: Secondary | ICD-10-CM | POA: Diagnosis not present

## 2020-03-11 DIAGNOSIS — H34211 Partial retinal artery occlusion, right eye: Secondary | ICD-10-CM | POA: Diagnosis not present

## 2020-03-11 DIAGNOSIS — H3561 Retinal hemorrhage, right eye: Secondary | ICD-10-CM | POA: Diagnosis not present

## 2020-03-11 DIAGNOSIS — E119 Type 2 diabetes mellitus without complications: Secondary | ICD-10-CM | POA: Diagnosis not present

## 2020-03-11 DIAGNOSIS — I10 Essential (primary) hypertension: Secondary | ICD-10-CM | POA: Diagnosis not present

## 2020-03-11 DIAGNOSIS — H35033 Hypertensive retinopathy, bilateral: Secondary | ICD-10-CM | POA: Diagnosis not present

## 2020-04-06 DIAGNOSIS — E213 Hyperparathyroidism, unspecified: Secondary | ICD-10-CM | POA: Diagnosis not present

## 2020-04-06 DIAGNOSIS — Z794 Long term (current) use of insulin: Secondary | ICD-10-CM | POA: Diagnosis not present

## 2020-04-06 DIAGNOSIS — N1832 Chronic kidney disease, stage 3b: Secondary | ICD-10-CM | POA: Diagnosis not present

## 2020-04-06 DIAGNOSIS — E1122 Type 2 diabetes mellitus with diabetic chronic kidney disease: Secondary | ICD-10-CM | POA: Diagnosis not present

## 2020-05-12 DIAGNOSIS — E042 Nontoxic multinodular goiter: Secondary | ICD-10-CM | POA: Diagnosis not present

## 2020-05-12 DIAGNOSIS — E1122 Type 2 diabetes mellitus with diabetic chronic kidney disease: Secondary | ICD-10-CM | POA: Diagnosis not present

## 2020-05-12 DIAGNOSIS — E213 Hyperparathyroidism, unspecified: Secondary | ICD-10-CM | POA: Diagnosis not present

## 2020-05-12 DIAGNOSIS — N184 Chronic kidney disease, stage 4 (severe): Secondary | ICD-10-CM | POA: Diagnosis not present

## 2020-05-12 DIAGNOSIS — Z794 Long term (current) use of insulin: Secondary | ICD-10-CM | POA: Diagnosis not present

## 2020-06-01 DIAGNOSIS — E042 Nontoxic multinodular goiter: Secondary | ICD-10-CM | POA: Diagnosis not present

## 2020-06-02 DIAGNOSIS — E042 Nontoxic multinodular goiter: Secondary | ICD-10-CM | POA: Diagnosis not present

## 2020-06-11 DIAGNOSIS — K219 Gastro-esophageal reflux disease without esophagitis: Secondary | ICD-10-CM | POA: Diagnosis not present

## 2020-06-11 DIAGNOSIS — E669 Obesity, unspecified: Secondary | ICD-10-CM | POA: Diagnosis not present

## 2020-06-11 DIAGNOSIS — E119 Type 2 diabetes mellitus without complications: Secondary | ICD-10-CM | POA: Diagnosis not present

## 2020-06-11 DIAGNOSIS — Z0000 Encounter for general adult medical examination without abnormal findings: Secondary | ICD-10-CM | POA: Diagnosis not present

## 2020-06-11 DIAGNOSIS — E538 Deficiency of other specified B group vitamins: Secondary | ICD-10-CM | POA: Diagnosis not present

## 2020-06-11 DIAGNOSIS — E785 Hyperlipidemia, unspecified: Secondary | ICD-10-CM | POA: Diagnosis not present

## 2020-06-11 DIAGNOSIS — Z6841 Body Mass Index (BMI) 40.0 and over, adult: Secondary | ICD-10-CM | POA: Diagnosis not present

## 2020-06-11 DIAGNOSIS — Z23 Encounter for immunization: Secondary | ICD-10-CM | POA: Diagnosis not present

## 2020-06-11 DIAGNOSIS — Z1331 Encounter for screening for depression: Secondary | ICD-10-CM | POA: Diagnosis not present

## 2020-06-15 ENCOUNTER — Other Ambulatory Visit: Payer: Self-pay

## 2020-06-15 ENCOUNTER — Ambulatory Visit: Payer: Medicare HMO | Admitting: General Surgery

## 2020-06-15 ENCOUNTER — Encounter: Payer: Self-pay | Admitting: General Surgery

## 2020-06-15 VITALS — BP 149/72 | HR 88 | Temp 98.0°F | Ht 62.0 in | Wt 228.0 lb

## 2020-06-15 DIAGNOSIS — E210 Primary hyperparathyroidism: Secondary | ICD-10-CM | POA: Diagnosis not present

## 2020-06-15 NOTE — Patient Instructions (Addendum)
We have spoken to you about having some of your parathyroid removed. This will be done at [facility] by Dr Celine Ahr. Please see your Blue surgery sheet for information. Our surgery scheduler will be in contact with you to look at surgery dates and to go over information.    Parathyroidectomy  A parathyroidectomy is a surgery to remove one or more parathyroid glands. These glands are in the neck. Each gland is very small, about the size of a pea. Most people have four parathyroid glands. The glands produce parathyroid hormone, which helps to control the level of calcium in the body. You may have a parathyroidectomy if your body produces too much parathyroid hormone (hyperparathyroidism). This usually occurs when one or more of your parathyroid glands becomes enlarged from a type of noncancerous tumor (adenoma). Tell a health care provider about:  Any allergies you have.  All medicines you are taking, including vitamins, herbs, eye drops, creams, and over-the-counter medicines.  Any problems you or family members have had with anesthetic medicines.  Any blood disorders you have.  Any surgeries you have had.  Any medical conditions you have.  Whether you are pregnant or may be pregnant. What are the risks? Generally, this is a safe procedure. However, problems may occur, including:  Bleeding.  Infection.  Allergic reactions to medicines.  Damage to the nerves of your voice box (larynx). This can be temporary or long-term (rare).  Damage to nearby structures and organs, such as the skin (scarring), surrounding blood vessels, and nerves in the neck.  Hoarseness. This usually resolves in 24-48 hours.  A condition in which your body does not make enough parathyroid hormone (hypoparathyroidism). This is rare.  Difficulty breathing. This is rare. What happens before the procedure? Staying hydrated Follow instructions from your health care provider about hydration, which may include:  Up to  2 hours before the procedure - you may continue to drink clear liquids, such as water, clear fruit juice, black coffee, and plain tea. Medicines Ask your health care provider about:  Changing or stopping your regular medicines. This is especially important if you are taking diabetes medicines or blood thinners.  Taking medicines such as aspirin and ibuprofen. These medicines can thin your blood. Do not take these medicines unless your health care provider tells you to take them.  Taking over-the-counter medicines, vitamins, herbs, and supplements. General instructions  You may be asked to shower with a germ-killing soap.  Plan to have a responsible adult take you home from the hospital or clinic.  Plan to have a responsible adult care for you for at least 24 hours after you leave the hospital or clinic. This is important. What happens during the procedure?  To lower your risk of infection: ? Your health care team will wash or sanitize their hands. ? Hair may be removed from the surgical area. ? Your skin will be washed with soap.  An IV will be inserted into one of your veins.  You will be given one or more of the following: ? A medicine to help you relax (sedative). ? A medicine to make you fall asleep (general anesthetic).  An incision will be made according to the type of parathyroidectomy procedure you are having. There are four methods that may be used: ? Open surgery. A single incision will be made in the center of your neck. The incision will be about 2-4 inches long. ? Minimally invasive surgery. A small incision will be made in the side of  your neck. This incision will be about 1-2 inches long. Before the procedure, you might be given an injection of a type of medicine that will help the surgeon to locate the gland. ? Video-assisted surgery. Two small incisions will be made in your neck. One incision is for the instruments that will be used to remove the gland. The other  incision is for a tiny camera that will help the surgeon to see inside your neck. ? Endoscopic surgery. An incision will be made just above your collarbone. A small, flexible tube (endoscope) will be inserted through this incision.  Your health care provider may monitor laryngeal nerve function during the procedure for safety reasons.  The gland or glands that are causing problems will be removed.  The incisions will be closed using stitches (sutures) or other methods. The sutures will often be hidden under the skin. The procedure may vary among health care providers and hospitals. What happens after the procedure?  Your blood pressure, heart rate, breathing rate, and blood oxygen level will be monitored until the medicines you were given have worn off.  You will be given pain medicine as needed.  Your provider will check your ability to talk and swallow after the procedure.  You will gradually start to drink liquids and have soft foods as tolerated.  Your blood will be tested to check the calcium level in your body.  Do not drive for 24 hours if you were given a sedative during your procedure. Summary  The parathyroid glands are located in the neck and produce parathyroid hormone, which helps to control the level of calcium in the body.  A parathyroidectomy is a surgery to remove one or more parathyroid glands.  You may have a parathyroidectomy if your body produces too much parathyroid hormone (hyperparathyroidism).  There are four surgical methods that may be used for a parathyroidectomy: open, minimally invasive, video-assisted, and endoscopic.  Generally, this is a safe procedure. However, problems may occur, including bleeding, infection, and a hoarse or weak voice. This information is not intended to replace advice given to you by your health care provider. Make sure you discuss any questions you have with your health care provider. Document Revised: 11/27/2019 Document  Reviewed: 11/27/2019 Elsevier Patient Education  2021 [employer].

## 2020-06-15 NOTE — Progress Notes (Signed)
Patient ID: Alexis Greer, female   DOB: [birth year], 80 y.o.   MRN: [id number]  Chief Complaint  Patient presents with  . New Patient (Initial Visit)    Primary hyperparathyroidism     HPI Alexis Greer is a 80 y.o. female.   She has been referred by Dr. Lavone Orn for further evaluation of primary hyperparathyroidism.  Her calcium has been elevated for a number of years, ranging between 10 to 10.8 mg/dL since 2016.  Her most recent value and concomitant PTH were 10.8 and 127, respectively.  25 hydroxy vitamin D has been above 40, most recent 72.  Serum protein electrophoresis was negative.  She does not take any thiazide diuretics, nor does she take calcium supplements, Tums, or other calcium-based antacid.  She denies any history of kidney stones.  Bone densitometry performed in 2021 did not show evidence of osteopenia or osteoporosis.  She has never had a pathologic fracture.  No history of pancreatitis.  She does endorse GERD, but denies having had an ulcer.  She has to urinate roughly every 2 hours and says that she likes to drink water.  She reports some decrease in memory and feels like sometimes she has to think through fog.  She has difficulty staying asleep at night.  She has chronic kidney disease, stage IV, with the most recent GFR being 29.  She does have a multinodular goiter and the nodules meeting criteria have been biopsied.  These were benign, but imaging did demonstrate a 2.3 cm extrathyroidal hypoechoic nodule on the right, near the superior pole, suggestive of a parathyroid adenoma.  Due to her renal insufficiency, she does meet criteria for parathyroid surgery and for this reason she has been referred to see me today.   Past Medical History:  Diagnosis Date  . Anemia   . Arthritis   . Asthma   . B12 deficiency   . Breast cancer ([place]) 11/17/14   right breast lumpectomy, triple negative, with mammosite tx. Chemo with held due to CVA after lumpectomy  . Chronic  kidney disease   . Complication of anesthesia    had a stroke during the breast lumpectomy  . Diabetes mellitus without complication ([place])   . Dyspnea   . Gout   . Hyperlipidemia   . Hypertension   . Malignant neoplasm ([place])   . Morbid obesity ([place])   . Personal history of radiation therapy 2016    mammosite radiation-RIGHT lumpectomy  . Pneumonia   . Renal insufficiency   . Stroke [facility]) 2016   post lumpectomy  . Vitamin D deficiency     Past Surgical History:  Procedure Laterality Date  . ABDOMINAL HYSTERECTOMY  1979  . BREAST BIOPSY Right 11/17/2014   Invasive mammory carcinoma  . BREAST BIOPSY Right 12/01/2014   Procedure: BREAST BIOPSY WITH NEEDLE LOCALIZATION;  Surgeon: Christene Lye, MD;  Location: [facility] ORS;  Service: General;  Laterality: Right;  . BREAST LUMPECTOMY Right 2016   INVASIVE MAMMARY CARCINOMA   . BREAST LUMPECTOMY WITH SENTINEL LYMPH NODE BIOPSY Right 12/01/2014   Procedure: BREAST LUMPECTOMY WITH SENTINEL LYMPH NODE BX;  Surgeon: Christene Lye, MD;  Location: [facility] ORS;  Service: General;  Laterality: Right;  . COLONOSCOPY WITH PROPOFOL N/A 06/13/2016   Procedure: COLONOSCOPY WITH PROPOFOL;  Surgeon: Lollie Sails, MD;  Location: [facility] ENDOSCOPY;  Service: Endoscopy;  Laterality: N/A;  . COLONOSCOPY WITH PROPOFOL N/A 12/03/2019   Procedure: COLONOSCOPY WITH PROPOFOL;  Surgeon: Alice Reichert, Benay Pike, MD;  Location: [facility] ENDOSCOPY;  Service: Gastroenterology;  Laterality: N/A;  . EYE SURGERY    . LOWER EXTREMITY INTERVENTION Left 11/09/2016   Procedure: LOWER EXTREMITY INTERVENTION;  Surgeon: Algernon Huxley, MD;  Location: [place] CV LAB;  Service: Cardiovascular;  Laterality: Left;  Marland Kitchen MASTECTOMY Right    lumpectomy   . OOPHORECTOMY  2014  . TONSILLECTOMY      Family History  Problem Relation Age of Onset  . Heart disease Mother   . Lung cancer Sister        smoker  . CAD Father   . Hyperlipidemia Father   . Thyroid disease Daughter   .  Thyroid disease Daughter     Social History Social History   Tobacco Use  . Smoking status: Former Smoker    Quit date: 08/25/1993    Years since quitting: 26.8  . Smokeless tobacco: Never Used  Vaping Use  . Vaping Use: Never used  Substance Use Topics  . Alcohol use: No    Alcohol/week: 0.0 standard drinks  . Drug use: No    Allergies  Allergen Reactions  . Oxycodone Other (See Comments)    It made the patient have shakes and jittery     Current Outpatient Medications  Medication Sig Dispense Refill  . ACCU-CHEK AVIVA PLUS test strip     . acetaminophen (TYLENOL) 500 MG tablet Take 1,000 mg by mouth every 6 (six) hours as needed for mild pain or headache.    . allopurinol (ZYLOPRIM) 100 MG tablet Take 100 mg by mouth 2 (two) times daily.     Marland Kitchen amLODipine (NORVASC) 10 MG tablet Take 10 mg by mouth at bedtime.     Marland Kitchen aspirin EC 81 MG tablet Take 81 mg by mouth daily.    Marland Kitchen atorvastatin (LIPITOR) 40 MG tablet Take 40 mg by mouth daily at 6 PM.    . Cholecalciferol 125 MCG (5000 UT) TABS Take 5,000 Units by mouth daily.     . DROPLET PEN NEEDLES 31G X 5 MM MISC     . ferrous sulfate 325 (65 FE) MG tablet Take 325 mg by mouth daily.     . Insulin Isophane & Regular Human (NOVOLIN 70/30 FLEXPEN) (70-30) 100 UNIT/ML PEN Inject 5 Units into the skin 2 (two) times daily. (Patient taking differently: Inject 26 Units into the skin 2 (two) times daily. 26 units in AM, 36 units in PM) 15 mL 11  . losartan (COZAAR) 25 MG tablet Take 25 mg by mouth daily.     . magnesium oxide (MAG-OX) 400 MG tablet Take 2 tablets by mouth 2 (two) times daily.    . Menthol-Methyl Salicylate (MUSCLE RUB) 10-15 % CREA Apply 1 application topically as needed for muscle pain.    . Multiple Vitamin (MULTI-VITAMINS) TABS Take 1 tablet by mouth daily.     Marland Kitchen omeprazole (PRILOSEC) 20 MG capsule Take 40 mg by mouth daily. Takes 2 tablets daily    . vitamin B-12 (CYANOCOBALAMIN) 1000 MCG tablet Take 1,000 mcg by mouth  daily.    . vitamin E 400 UNIT capsule Take 400 Units by mouth daily.     Marland Kitchen ezetimibe (ZETIA) 10 MG tablet Take 10 mg by mouth daily.     Current Facility-Administered Medications  Medication Dose Route Frequency Provider Last Rate Last Admin  . ondansetron (ZOFRAN) injection 4 mg  4 mg Intravenous Once Algernon Huxley, MD        Review of Systems Review of  Systems  Musculoskeletal: Positive for arthralgias.       Knees  All other systems reviewed and are negative. Or as discussed in the history of present illness.  Blood pressure (!) 149/72, pulse 88, temperature 98 F (36.7 C), height 5\' 2"  (1.575 m), weight 228 lb (103.4 kg), SpO2 98 %. Body mass index is 41.7 kg/m.  Physical Exam Physical Exam Vitals reviewed.  Constitutional:      General: She is not in acute distress.    Appearance: She is obese.  HENT:     Head: Normocephalic and atraumatic.     Nose:     Comments: Covered with a mask    Mouth/Throat:     Comments: Covered with a mask Eyes:     General: No scleral icterus.       Right eye: No discharge.        Left eye: No discharge.  Neck:     Comments: The trachea is midline.  The thyroid is mildly enlarged bilaterally with an irregular contour, suggestive of a multinodular goiter.  The gland moves freely with deglutition. Cardiovascular:     Rate and Rhythm: Normal rate and regular rhythm.  Pulmonary:     Effort: Pulmonary effort is normal.     Breath sounds: Normal breath sounds.  Abdominal:     General: Bowel sounds are normal.     Palpations: Abdomen is soft.  Genitourinary:    Comments: Deferred Musculoskeletal:        General: No signs of injury.  Skin:    General: Skin is warm.  Neurological:     General: No focal deficit present.     Mental Status: She is alert and oriented to person, place, and time.  Psychiatric:        Mood and Affect: Mood normal.        Behavior: Behavior normal.     Data Reviewed The relevant labs and imaging studies  were reviewed from Dr. Joycie Peek notes and discussed in the history of present illness.  Assessment This is a 80 year old woman with biochemical evidence of primary hyperparathyroidism.  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  Due to her renal insufficiency, she does meet criteria for surgical intervention.  I have recommended that she undergo parathyroidectomy. The risks of parathyroid surgery were discussed with the patient, including (but not limited to): bleeding, infection, damage to surrounding structures/tissues, injury (temporary or permanent) to the recurrent laryngeal nerve, hypocalcemia (temporary or permanent), need to take calcium and/or vitamin D supplementation, recurrent hyperparathyroidism, failure to correct hyperparathyroidism, need for additional surgery.  The patient had the opportunity to ask any questions and these were answered to her satisfaction.  Plan We will work on coordinating an OR date with the patient and her daughter.  Due to her history of stroke associated with the anesthetic for her breast cancer surgery, we will place a referral to anesthesiology for preoperative evaluation, in addition to the routine preadmission testing.    Fredirick Maudlin 06/15/2020, 1:00 PM

## 2020-06-15 NOTE — H&P (View-Only) (Signed)
Patient ID: Alexis Greer, female   DOB: [birth year], 80 y.o.   MRN: [id number]  Chief Complaint  Patient presents with  . New Patient (Initial Visit)    Primary hyperparathyroidism     HPI Alexis Greer is a 80 y.o. female.   She has been referred by Dr. Lavone Orn for further evaluation of primary hyperparathyroidism.  Her calcium has been elevated for a number of years, ranging between 10 to 10.8 mg/dL since 2016.  Her most recent value and concomitant PTH were 10.8 and 127, respectively.  25 hydroxy vitamin D has been above 40, most recent 72.  Serum protein electrophoresis was negative.  She does not take any thiazide diuretics, nor does she take calcium supplements, Tums, or other calcium-based antacid.  She denies any history of kidney stones.  Bone densitometry performed in 2021 did not show evidence of osteopenia or osteoporosis.  She has never had a pathologic fracture.  No history of pancreatitis.  She does endorse GERD, but denies having had an ulcer.  She has to urinate roughly every 2 hours and says that she likes to drink water.  She reports some decrease in memory and feels like sometimes she has to think through fog.  She has difficulty staying asleep at night.  She has chronic kidney disease, stage IV, with the most recent GFR being 29.  She does have a multinodular goiter and the nodules meeting criteria have been biopsied.  These were benign, but imaging did demonstrate a 2.3 cm extrathyroidal hypoechoic nodule on the right, near the superior pole, suggestive of a parathyroid adenoma.  Due to her renal insufficiency, she does meet criteria for parathyroid surgery and for this reason she has been referred to see me today.   Past Medical History:  Diagnosis Date  . Anemia   . Arthritis   . Asthma   . B12 deficiency   . Breast cancer ([place]) 11/17/14   right breast lumpectomy, triple negative, with mammosite tx. Chemo with held due to CVA after lumpectomy  . Chronic  kidney disease   . Complication of anesthesia    had a stroke during the breast lumpectomy  . Diabetes mellitus without complication ([place])   . Dyspnea   . Gout   . Hyperlipidemia   . Hypertension   . Malignant neoplasm ([place])   . Morbid obesity ([place])   . Personal history of radiation therapy 2016    mammosite radiation-RIGHT lumpectomy  . Pneumonia   . Renal insufficiency   . Stroke [facility]) 2016   post lumpectomy  . Vitamin D deficiency     Past Surgical History:  Procedure Laterality Date  . ABDOMINAL HYSTERECTOMY  1979  . BREAST BIOPSY Right 11/17/2014   Invasive mammory carcinoma  . BREAST BIOPSY Right 12/01/2014   Procedure: BREAST BIOPSY WITH NEEDLE LOCALIZATION;  Surgeon: Christene Lye, MD;  Location: [facility] ORS;  Service: General;  Laterality: Right;  . BREAST LUMPECTOMY Right 2016   INVASIVE MAMMARY CARCINOMA   . BREAST LUMPECTOMY WITH SENTINEL LYMPH NODE BIOPSY Right 12/01/2014   Procedure: BREAST LUMPECTOMY WITH SENTINEL LYMPH NODE BX;  Surgeon: Christene Lye, MD;  Location: [facility] ORS;  Service: General;  Laterality: Right;  . COLONOSCOPY WITH PROPOFOL N/A 06/13/2016   Procedure: COLONOSCOPY WITH PROPOFOL;  Surgeon: Lollie Sails, MD;  Location: [facility] ENDOSCOPY;  Service: Endoscopy;  Laterality: N/A;  . COLONOSCOPY WITH PROPOFOL N/A 12/03/2019   Procedure: COLONOSCOPY WITH PROPOFOL;  Surgeon: Alice Reichert, Benay Pike, MD;  Location: [facility] ENDOSCOPY;  Service: Gastroenterology;  Laterality: N/A;  . EYE SURGERY    . LOWER EXTREMITY INTERVENTION Left 11/09/2016   Procedure: LOWER EXTREMITY INTERVENTION;  Surgeon: Algernon Huxley, MD;  Location: [facility] CV LAB;  Service: Cardiovascular;  Laterality: Left;  Marland Kitchen MASTECTOMY Right    lumpectomy   . OOPHORECTOMY  2014  . TONSILLECTOMY      Family History  Problem Relation Age of Onset  . Heart disease Mother   . Lung cancer Sister        smoker  . CAD Father   . Hyperlipidemia Father   . Thyroid disease Daughter   .  Thyroid disease Daughter     Social History Social History   Tobacco Use  . Smoking status: Former Smoker    Quit date: 08/25/1993    Years since quitting: 26.8  . Smokeless tobacco: Never Used  Vaping Use  . Vaping Use: Never used  Substance Use Topics  . Alcohol use: No    Alcohol/week: 0.0 standard drinks  . Drug use: No    Allergies  Allergen Reactions  . Oxycodone Other (See Comments)    It made the patient have shakes and jittery     Current Outpatient Medications  Medication Sig Dispense Refill  . ACCU-CHEK AVIVA PLUS test strip     . acetaminophen (TYLENOL) 500 MG tablet Take 1,000 mg by mouth every 6 (six) hours as needed for mild pain or headache.    . allopurinol (ZYLOPRIM) 100 MG tablet Take 100 mg by mouth 2 (two) times daily.     Marland Kitchen amLODipine (NORVASC) 10 MG tablet Take 10 mg by mouth at bedtime.     Marland Kitchen aspirin EC 81 MG tablet Take 81 mg by mouth daily.    Marland Kitchen atorvastatin (LIPITOR) 40 MG tablet Take 40 mg by mouth daily at 6 PM.    . Cholecalciferol 125 MCG (5000 UT) TABS Take 5,000 Units by mouth daily.     . DROPLET PEN NEEDLES 31G X 5 MM MISC     . ferrous sulfate 325 (65 FE) MG tablet Take 325 mg by mouth daily.     . Insulin Isophane & Regular Human (NOVOLIN 70/30 FLEXPEN) (70-30) 100 UNIT/ML PEN Inject 5 Units into the skin 2 (two) times daily. (Patient taking differently: Inject 26 Units into the skin 2 (two) times daily. 26 units in AM, 36 units in PM) 15 mL 11  . losartan (COZAAR) 25 MG tablet Take 25 mg by mouth daily.     . magnesium oxide (MAG-OX) 400 MG tablet Take 2 tablets by mouth 2 (two) times daily.    . Menthol-Methyl Salicylate (MUSCLE RUB) 10-15 % CREA Apply 1 application topically as needed for muscle pain.    . Multiple Vitamin (MULTI-VITAMINS) TABS Take 1 tablet by mouth daily.     Marland Kitchen omeprazole (PRILOSEC) 20 MG capsule Take 40 mg by mouth daily. Takes 2 tablets daily    . vitamin B-12 (CYANOCOBALAMIN) 1000 MCG tablet Take 1,000 mcg by mouth  daily.    . vitamin E 400 UNIT capsule Take 400 Units by mouth daily.     Marland Kitchen ezetimibe (ZETIA) 10 MG tablet Take 10 mg by mouth daily.     Current Facility-Administered Medications  Medication Dose Route Frequency Provider Last Rate Last Admin  . ondansetron (ZOFRAN) injection 4 mg  4 mg Intravenous Once Algernon Huxley, MD        Review of Systems Review of  Systems  Musculoskeletal: Positive for arthralgias.       Knees  All other systems reviewed and are negative. Or as discussed in the history of present illness.  Blood pressure (!) 149/72, pulse 88, temperature 98 F (36.7 C), height 5\' 2"  (1.575 m), weight 228 lb (103.4 kg), SpO2 98 %. Body mass index is 41.7 kg/m.  Physical Exam Physical Exam Vitals reviewed.  Constitutional:      General: She is not in acute distress.    Appearance: She is obese.  HENT:     Head: Normocephalic and atraumatic.     Nose:     Comments: Covered with a mask    Mouth/Throat:     Comments: Covered with a mask Eyes:     General: No scleral icterus.       Right eye: No discharge.        Left eye: No discharge.  Neck:     Comments: The trachea is midline.  The thyroid is mildly enlarged bilaterally with an irregular contour, suggestive of a multinodular goiter.  The gland moves freely with deglutition. Cardiovascular:     Rate and Rhythm: Normal rate and regular rhythm.  Pulmonary:     Effort: Pulmonary effort is normal.     Breath sounds: Normal breath sounds.  Abdominal:     General: Bowel sounds are normal.     Palpations: Abdomen is soft.  Genitourinary:    Comments: Deferred Musculoskeletal:        General: No signs of injury.  Skin:    General: Skin is warm.  Neurological:     General: No focal deficit present.     Mental Status: She is alert and oriented to person, place, and time.  Psychiatric:        Mood and Affect: Mood normal.        Behavior: Behavior normal.     Data Reviewed The relevant labs and imaging studies  were reviewed from Dr. Joycie Peek notes and discussed in the history of present illness.  Assessment This is a 80 year old woman with biochemical evidence of primary hyperparathyroidism.  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  Due to her renal insufficiency, she does meet criteria for surgical intervention.  I have recommended that she undergo parathyroidectomy. The risks of parathyroid surgery were discussed with the patient, including (but not limited to): bleeding, infection, damage to surrounding structures/tissues, injury (temporary or permanent) to the recurrent laryngeal nerve, hypocalcemia (temporary or permanent), need to take calcium and/or vitamin D supplementation, recurrent hyperparathyroidism, failure to correct hyperparathyroidism, need for additional surgery.  The patient had the opportunity to ask any questions and these were answered to her satisfaction.  Plan We will work on coordinating an OR date with the patient and her daughter.  Due to her history of stroke associated with the anesthetic for her breast cancer surgery, we will place a referral to anesthesiology for preoperative evaluation, in addition to the routine preadmission testing.    Fredirick Maudlin 06/15/2020, 1:00 PM

## 2020-06-16 ENCOUNTER — Telehealth: Payer: Self-pay | Admitting: General Surgery

## 2020-06-16 NOTE — Telephone Encounter (Signed)
Patient has been advised of Pre-Admission date/time, COVID Testing date and Surgery date.  Surgery Date: 07/14/20 Preadmission Testing Date: 07/07/20 (phone 8a-1p) Covid Testing Date: 07/12/20 in person at 8:00 am, patient advised to go to the [facility] ([place])   Patient has been made aware to call [phone], between 1-3:00pm the day before surgery, to find out what time to arrive for surgery.

## 2020-06-21 DIAGNOSIS — E1122 Type 2 diabetes mellitus with diabetic chronic kidney disease: Secondary | ICD-10-CM | POA: Diagnosis not present

## 2020-06-21 DIAGNOSIS — R809 Proteinuria, unspecified: Secondary | ICD-10-CM | POA: Diagnosis not present

## 2020-06-21 DIAGNOSIS — I10 Essential (primary) hypertension: Secondary | ICD-10-CM | POA: Diagnosis not present

## 2020-06-21 DIAGNOSIS — N184 Chronic kidney disease, stage 4 (severe): Secondary | ICD-10-CM | POA: Diagnosis not present

## 2020-06-21 DIAGNOSIS — E210 Primary hyperparathyroidism: Secondary | ICD-10-CM | POA: Diagnosis not present

## 2020-07-07 ENCOUNTER — Other Ambulatory Visit
Admission: RE | Admit: 2020-07-07 | Discharge: 2020-07-07 | Disposition: A | Discharge status: Home / Self Care | Payer: Medicare HMO | Source: Ambulatory Visit | Attending: General Surgery | Admitting: General Surgery

## 2020-07-07 ENCOUNTER — Other Ambulatory Visit: Payer: Self-pay

## 2020-07-07 HISTORY — DX: Gastro-esophageal reflux disease without esophagitis: K21.9

## 2020-07-07 HISTORY — DX: Hyperparathyroidism, unspecified: E21.3

## 2020-07-07 NOTE — Patient Instructions (Addendum)
Your procedure is scheduled on: 07/14/2020 Wednesday Report to the Registration Desk on the 1st floor of the [facility]. To find out your arrival time, please call [phone] between 1PM - 3PM on: Tuesday 07/13/2020  REMEMBER: Instructions that are not followed completely may result in serious medical risk, up to and including death; or upon the discretion of your surgeon and anesthesiologist your surgery may need to be rescheduled.  Do not eat OR DRINK after midnight the night before surgery.  No gum chewing, lozengers or hard candies.  TAKE THESE MEDICATIONS THE MORNING OF SURGERY WITH A SIP OF WATER: ALLOPURINOL OMEPRAZOLE (take one the night before and one on the morning of surgery - helps to prevent nausea after surgery.)  Take NO insulin the morning of surgery.  Follow recommendations from Cardiologist, Pulmonologist or PCP regarding stopping Aspirin, Coumadin, Plavix, Eliquis, Pradaxa, or Pletal.   One week prior to surgery: Stop Anti-inflammatories (NSAIDS) such as Advil, Aleve, Ibuprofen, Motrin, Naproxen, Naprosyn and Aspirin based products such as Excedrin, Goodys Powder, BC Powder.  TYLENOL ONLY IF NEEDED FOR PAIN Stop ANY OVER THE COUNTER supplements until after surgery. TAKE ONLY VIT B AND VIT D  No Alcohol for 24 hours before or after surgery.  No Smoking including e-cigarettes for 24 hours prior to surgery.  No chewable tobacco products for at least 6 hours prior to surgery.  No nicotine patches on the day of surgery.  Do not use any "recreational" drugs for at least a week prior to your surgery.  Please be advised that the combination of cocaine and anesthesia may have negative outcomes, up to and including death. If you test positive for cocaine, your surgery will be cancelled.  On the morning of surgery brush your teeth with toothpaste and water, you may rinse your mouth with mouthwash if you wish. Do not swallow any toothpaste or mouthwash.  Do not wear  jewelry, make-up, hairpins, clips or nail polish.  Do not wear lotions, powders, or perfumes NO DEODORANT  Do not shave body from the neck down 48 hours prior to surgery just in case you cut yourself which could leave a site for infection.  Also, freshly shaved skin may become irritated if using the CHG soap.  Contact lenses, hearing aids and dentures may not be worn into surgery.  Do not bring valuables to the hospital. [facility] is not responsible for any missing/lost belongings or valuables.   SHOWER DAY OF SURGERY  Notify your doctor if there is any change in your medical condition (cold, fever, infection).  Wear comfortable clothing (specific to your surgery type) to the hospital.  Plan for stool softeners for home use; pain medications have a tendency to cause constipation. You can also help prevent constipation by eating foods high in fiber such as fruits and vegetables and drinking plenty of fluids as your diet allows.  After surgery, you can help prevent lung complications by doing breathing exercises.  Take deep breaths and cough every 1-2 hours. Your doctor may order a device called an Incentive Spirometer to help you take deep breaths. When coughing or sneezing, hold a pillow firmly against your incision with both hands. This is called "splinting." Doing this helps protect your incision. It also decreases belly discomfort.  If you are being discharged the day of surgery, you will not be allowed to drive home. You will need a responsible adult (18 years or older) to drive you home and stay with you that night.  Please call the [facility]. at [phone] if you have any questions about these instructions.  Surgery Visitation Policy:  Patients undergoing a surgery or procedure may have one family member or support person with them as long as that person is not COVID-19 positive or experiencing its symptoms.  That person may remain in the waiting area  during the procedure.  Inpatient Visitation:    Visiting hours are 7 a.m. to 8 p.m. Inpatients will be allowed two visitors daily. The visitors may change each day during the patient's stay. No visitors under the age of 39. Any visitor under the age of 57 must be accompanied by an adult. The visitor must pass COVID-19 screenings, use hand sanitizer when entering and exiting the patient's room and wear a mask at all times, including in the patient's room. Patients must also wear a mask when staff or their visitor are in the room. Masking is required regardless of vaccination status.

## 2020-07-09 ENCOUNTER — Encounter: Payer: Self-pay | Admitting: General Surgery

## 2020-07-09 NOTE — Consult Note (Signed)
Perioperative Services  Pre-Admission/Anesthesia Testing Clinical Consult  Date: 07/13/20  Patient Demographics:  Name: Alexis Greer DOB:   02-21-1941 MRN:   [id number]  Planned Surgical Procedure(s):    Case: [id number] Date/Time: 07/14/20 0815   Procedure: PARATHYROIDECTOMY, RNFA to assist (N/A )   Anesthesia type: General   Pre-op diagnosis: primary hyperparathyroidism   Location: [facility]   Surgeons: Fredirick Maudlin, MD    NOTE: Available PAT nursing documentation and vital signs have been reviewed. Clinical nursing staff has updated patient's PMH/PSHx, current medication list, and drug allergies/intolerances to ensure comprehensive history available to assist in medical decision making as it pertains to the aforementioned surgical procedure and anticipated anesthetic course.   Clinical Discussion:  Alexis Greer is a 80 y.o. female who is submitted for pre-surgical anesthesia review and clearance prior to her undergoing the above procedure. Patient is a Former Smoker (quit 08/1993). Pertinent PMH includes: post-operative CVA, HTN, HLD, T2DM, shortness of breath, CKD, GERD (on daily PPI), anemia, OA, hyperparathyroidism, breast cancer (s/p chemoradiation).   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  Patient underwent surgical resection (lumpectomy) in 11/2014 with Dr. Junie Panning.  Shortly after arriving in PACU, patient noted to have a decreased expiratory effort.  Room air oxygen saturations 91% and BBS noted to be diminished.  Patient with obvious LEFT sided facial drooping, dysarthria, and had unilateral RIGHT sided weakness.  Anesthesia was called to bedside for evaluation; Drs. Kayleen Memos and Piscitello responded.  Symptoms felt to be related to CVA vs. TIA. Decision was made to admit; hospitalist was called for further evaluation.  Patient was seen in consult by  hospitalist Bobbye Charleston, MD).  MD noted that patient had an episode of brief intraoperative hypotension.  This, coupled with the fact the patient held her antiplatelet therapy preoperatively, gave rise to the concern for acute CVA.  Initial assessment revealed dysarthria. Patient reported resolution of the unilateral weakness.  Patient able to swallow water with no evidence of associated dysphagia.  Patient was sent for imaging as follows:   CT of the head without contrast was grossly unremarkable.   MRI of the brain without contrast revealed an acute RIGHT frontotemporal infarct.  There was a second area of acute LEFT occipital infarct noted.  There was no complicating hemorrhage or vascular occlusion noted.   BILATERAL carotid Doppler studies were noted to be normal with no evidence of carotid stenosis or focal plaque.  Patient was seen in consult during her admission by neurology Irish Elders, MD); notes reviewed.  Patient was asymptomatic and back to baseline during neurology assessment.  No facial droop, dysarthria, or focal weakness noted on exam.  Recent CT and MRI imaging was reviewed revealing the aforementioned RIGHT frontotemporal and small LEFT occipital infarct.  Acute CVA felt to be likely secondary to intraoperative hypoperfusion as it was not noted to be embolic in nature.  The decision was made to discharge patient with plans to follow-up with PCP as an outpatient.  Patient discharged home on POD-1.   Patient was seen in follow-up consult on 01/01/2015 by her primary care office; notes reviewed.  She was being seen in hospital follow-up following lumpectomy and post-operative CVA.  Patient had no residual symptoms related to her CVA.  Patient complained of bilateral knee pain (L>R) that radiated distally causing her to feel like her knees were going to "buckle".  Patient was prescribed a 10-day  corticosteroid burst and referred to orthopedics for further evaluation of bilateral knee pain.   Patient was also referred to neurology, however patient deferred citing that she had too many medical appointments. Patient on GDMT for her HTN and HLD diagnoses.  Blood pressure loosely controlled at 140/64 on currently prescribed CCB and ARB therapies.  Patient is on a statin and ezetimibe for her HLD. T2DM well controlled on currently prescribed regimen; Hgb A1c 7% in 09/2014. Functional capacity limited by orthopedic pain.  No changes were made to patient's medication regimen.    Patient has had no further cardiovascular or neurological issues since her stroke in 2016.  She has undergone several anesthetic courses here at [facility] without intraoperative/postoperative complications.  Patient last underwent general anesthesia for her routine colonoscopy in 11/2019 (ASA III) with no documented complications.  Patient with full range of motion of her cervical spine.  She has no documented issues of previous difficulties with intubation; Mallampati III.   In the interim, patient was last seen by her PCP on 06/11/2020; notes reviewed. Patient now being followed by endocrinology for her T2DM.  Preprandial blood sugars running in the 120-130s.; hemoglobin A1c in 03/2020 was 7.6%; microalbumin 477.  Patient with CKD diagnosis and is followed by nephrology (singh, MD). Last creatinine was 2.0 and 03/2020, which was noted to be stable over the last couple of years.  HTN reasonably controlled on CCB and ARB therapies; blood pressure 142/80 in clinic.  Lipid lowering therapy continued without associated side effects. Patient on daily low-dose ASA for CAD prophylaxis. To date, patient has yet to follow-up with neurology following her CVA.  Patient is scheduled for a parathyroidectomy on 07/14/2020 with Dr. Fredirick Maudlin.  Given patient's past medical history significant for CVA and multiple comorbidities, clearance is sought from specialties currently involved in patient's care.  Again, other than in the hospital, patient  has never seen neurology following her stroke.  Specialty clearances were obtained as follows:   Per internal medicine Lovie Macadamia, MD), "patient is optimized for surgery and may proceed with planned surgical intervention at an overall MODERATE risk for perioperative complications.  Blood pressure, blood sugars, and kidney function need to be carefully monitored throughout the perioperative period". This patient is on daily antiplatelet therapy.  She has been instructed on recommendations from her PCP for holding her low-dose ASA for 5 days prior to her procedure with plans to resume 1 day postoperatively or when postoperative bleeding risk up to minimized by primary attending surgeon.  The patient is aware that her last dose of ASA will be taken on 07/08/2020.   Per endocrinology Gabriel Carina, MD), "diabetes is controlled. Last A1c 7.6%. No charges are needed for surgery. May proceed with an ACCEPTABLE risk".    Per nephrology Candiss Norse, MD), "this patient is optimized for surgery and may proceed with the planned procedural course with a LOW risk of perioperative complications".  Patient denies previous perioperative complications with anesthesia. In review of her medical records it is noted that patient underwent a general anesthetic course here (ASA III) in 11/2019 with no documented complications.   Vitals with BMI 07/07/2020 06/15/2020 12/10/2019  Height 5\' 2"  5\' 2"  5\' 2"   Weight 229 lbs 228 lbs 223 lbs  BMI 41.87 96.22 29.79  Systolic - 892 119  Diastolic - 72 84  Pulse - 88 102    Providers/Specialists:   NOTE: Primary physician provider listed below. Patient may have been seen by APP or partner within same practice.  PROVIDER ROLE / SPECIALTY LAST Lucy Antigua, MD General Surgery  06/15/2020  Juluis Pitch, MD Primary Care Provider  06/11/2020  Murlean Iba, MD Nephrology  06/21/2020  Lavone Orn, MD Endocrinology  05/12/2020   Allergies:  Oxycodone  Current Home  Medications:   No current facility-administered medications for this encounter.   Marland Kitchen acetaminophen (TYLENOL) 325 MG tablet  . allopurinol (ZYLOPRIM) 100 MG tablet  . amLODipine (NORVASC) 10 MG tablet  . aspirin EC 81 MG tablet  . atorvastatin (LIPITOR) 40 MG tablet  . cholecalciferol (VITAMIN D3) 25 MCG (1000 UNIT) tablet  . diclofenac Sodium (VOLTAREN) 1 % GEL  . diphenhydrAMINE (BENADRYL) 25 MG tablet  . ezetimibe (ZETIA) 10 MG tablet  . Insulin Isophane & Regular Human (NOVOLIN 70/30 FLEXPEN) (70-30) 100 UNIT/ML PEN  . losartan (COZAAR) 25 MG tablet  . magnesium oxide (MAG-OX) 400 MG tablet  . omeprazole (PRILOSEC) 20 MG capsule  . Tetrahydrozoline HCl (GOODSENSE EYE DROPS OP)  . vitamin B-12 (CYANOCOBALAMIN) 1000 MCG tablet  . vitamin E 400 UNIT capsule  . ACCU-CHEK AVIVA PLUS test strip  . DROPLET PEN NEEDLES 31G X 5 MM MISC   History:   Past Medical History:  Diagnosis Date  . Anemia   . Arthritis   . Asthma   . B12 deficiency   . Breast cancer ([place]) 11/17/14   right breast lumpectomy, triple negative, with mammosite tx. Chemo with held due to CVA after lumpectomy  . CKD (chronic kidney disease), stage IV ([place])   . Complication of anesthesia    had a stroke during the breast lumpectomy  . Diabetes mellitus without complication ([place])   . Dyspnea   . GERD (gastroesophageal reflux disease)   . Gout   . Hyperlipidemia   . Hyperparathyroidism ([place])   . Hypertension   . Malignant neoplasm ([place])   . Morbid obesity ([place])   . Personal history of radiation therapy 2016    mammosite radiation-RIGHT lumpectomy  . Pneumonia   . Stroke [facility]) 2016   post lumpectomy  . Vitamin D deficiency    Past Surgical History:  Procedure Laterality Date  . ABDOMINAL HYSTERECTOMY  1979  . BREAST BIOPSY Right 11/17/2014   Invasive mammory carcinoma  . BREAST BIOPSY Right 12/01/2014   Procedure: BREAST BIOPSY WITH NEEDLE LOCALIZATION;  Surgeon: Christene Lye, MD;  Location: [facility]  ORS;  Service: General;  Laterality: Right;  . BREAST LUMPECTOMY Right 2016   INVASIVE MAMMARY CARCINOMA   . BREAST LUMPECTOMY WITH SENTINEL LYMPH NODE BIOPSY Right 12/01/2014   Procedure: BREAST LUMPECTOMY WITH SENTINEL LYMPH NODE BX;  Surgeon: Christene Lye, MD;  Location: [facility] ORS;  Service: General;  Laterality: Right;  . CHOLECYSTECTOMY  2020  . COLONOSCOPY WITH PROPOFOL N/A 06/13/2016   Procedure: COLONOSCOPY WITH PROPOFOL;  Surgeon: Lollie Sails, MD;  Location: [facility] ENDOSCOPY;  Service: Endoscopy;  Laterality: N/A;  . COLONOSCOPY WITH PROPOFOL N/A 12/03/2019   Procedure: COLONOSCOPY WITH PROPOFOL;  Surgeon: Toledo, Benay Pike, MD;  Location: [facility] ENDOSCOPY;  Service: Gastroenterology;  Laterality: N/A;  . EYE SURGERY Bilateral    CATARACT REMOVAL  . LOWER EXTREMITY INTERVENTION Left 11/09/2016   Procedure: LOWER EXTREMITY INTERVENTION;  Surgeon: Algernon Huxley, MD;  Location: [facility];  Service: Cardiovascular;  Laterality: Left;  Marland Kitchen MASTECTOMY Right    lumpectomy   . OOPHORECTOMY  2014  . TONSILLECTOMY     Family History  Problem Relation Age of Onset  . Heart  disease Mother   . Lung cancer Sister        smoker  . CAD Father   . Hyperlipidemia Father   . Thyroid disease Daughter   . Thyroid disease Daughter    Social History   Tobacco Use  . Smoking status: Former Smoker    Quit date: 08/25/1993    Years since quitting: 26.9  . Smokeless tobacco: Never Used  Vaping Use  . Vaping Use: Never used  Substance Use Topics  . Alcohol use: No    Alcohol/week: 0.0 standard drinks  . Drug use: No    Pertinent Clinical Results:  LABS: Labs reviewed: Acceptable for surgery.  Hospital Outpatient Visit on 07/12/2020  Component Date Value Ref Range Status  . WBC 07/12/2020 4.8  4.0 - 10.5 K/uL Final  . RBC 07/12/2020 3.96  3.87 - 5.11 MIL/uL Final  . Hemoglobin 07/12/2020 11.9* 12.0 - 15.0 g/dL Final  . HCT 07/12/2020 36.2  36.0 - 46.0 % Final  . MCV  07/12/2020 91.4  80.0 - 100.0 fL Final  . MCH 07/12/2020 30.1  26.0 - 34.0 pg Final  . MCHC 07/12/2020 32.9  30.0 - 36.0 g/dL Final  . RDW 07/12/2020 14.6  11.5 - 15.5 % Final  . Platelets 07/12/2020 263  150 - 400 K/uL Final  . nRBC 07/12/2020 0.0  0.0 - 0.2 % Final   Performed at [facility], [address] [place], [place] [address]  . Sodium 07/12/2020 139  135 - 145 mmol/L Final  . Potassium 07/12/2020 4.2  3.5 - 5.1 mmol/L Final  . Chloride 07/12/2020 109  98 - 111 mmol/L Final  . CO2 07/12/2020 21* 22 - 32 mmol/L Final  . Glucose, Bld 07/12/2020 186* 70 - 99 mg/dL Final   Glucose reference range applies only to samples taken after fasting for at least 8 hours.  . BUN 07/12/2020 27* 8 - 23 mg/dL Final  . Creatinine, Ser 07/12/2020 1.78* 0.44 - 1.00 mg/dL Final  . Calcium 07/12/2020 9.9  8.9 - 10.3 mg/dL Final  . GFR, Estimated 07/12/2020 29* >60 mL/min Final   Comment: (NOTE) Calculated using the CKD-EPI Creatinine Equation (2021)   . Anion gap 07/12/2020 9  5 - 15 Final   Performed at [facility], [address] [place], [place] [address]    ECG: Date: 07/12/2020 Time ECG obtained: 0856 AM Rate: 80 bpm Rhythm: Sinus rhythm with first-degree AV block Axis (leads I and aVF): Normal Intervals: PR 228 ms. QRS 76 ms. QTc 385 ms. ST segment and T wave changes: No evidence of acute ST segment elevation or depression Comparison: Similar to previous tracing obtained on 11/26/2014   IMAGING / PROCEDURES: MRI BRAIN WITHOUT CONTRAST performed on 12/01/2014 1. Acute RIGHT frontotemporal infarct 2. Second area of acute LEFT occipital infarct is noted 3. No complicating hemorrhage 4. No visible vascular occlusion  CT HEAD WITHOUT CONTRAST performed on 12/01/2014 1. No evidence of acute intracranial abnormality including hemorrhage, infarct, mass lesion, mass-effect, midline shift, or abnormal extra-axial fluid collection 2. No hydrocephalus or  pneumocephalus 3. Imaged paranasal sinuses and mastoid sinuses are clear 4. Negative exam   BILATERAL CAROTID DOPPLER performed on 12/01/2014 1. RIGHT CAROTID ARTERY:   No focal plaque is identified.   Velocities and waveforms are within normal limits.   There is no evidence of right carotid stenosis in the neck. 2. RIGHT VERTEBRAL ARTERY:   Antegrade flow with normal waveform and velocity. 3. LEFT CAROTID ARTERY:  No focal plaque is identified.   Velocities and waveforms are normal.   There is no evidence of left carotid stenosis in the neck. 4. LEFT VERTEBRAL ARTERY:   Antegrade flow with normal waveform and velocity.  ECHOCARDIOGRAM performed on 12/01/2014 1. LVEF 60% 2. Left ventricular systolic function normal 3. Left ventricular cavity size normal 4. Doppler parameters are consistent with abnormal left-ventricular relaxation (grade 1 diastolic dysfunction) 5. Mild mitral valve regurgitation 6. Right atrium mildly dilated 7. No ASD or PFO identified   Impression and Plan:  Alexis Greer has been referred for pre-anesthesia review and clearance prior to her undergoing the planned anesthetic and procedural courses. Available labs, pertinent testing, and imaging results were personally reviewed by me. This patient has been appropriately cleared by internal medicine (MODERATE), nephrology (LOW), and endocrinology (ACCEPTABLE) with the noted risk of significant perioperative complications.  Based on clinical review performed today (07/13/20), barring any significant acute changes in the patient's overall condition, it is anticipated that she will be able to proceed with the planned surgical intervention. Any acute changes in clinical condition may necessitate her procedure being postponed and/or cancelled. Patient will meet with anesthesia team (MD and/or CRNA) on this day of her procedure for preoperative evaluation/assessment.   Pre-surgical instructions were  reviewed with the patient during her PAT appointment and questions were fielded by PAT clinical staff. Patient was advised that if any questions or concerns arise prior to her procedure then she should return a call to PAT and/or her surgeon's office to discuss.  Honor Loh, MSN, APRN, FNP-C, CEN [facility]  Peri-operative Services Nurse Practitioner Phone: [phone] 07/13/20 2:49 PM  NOTE: This note has been prepared using Dragon dictation software. Despite my best ability to proofread, there is always the potential that unintentional transcriptional errors may still occur from this process.

## 2020-07-12 ENCOUNTER — Other Ambulatory Visit: Payer: Self-pay

## 2020-07-12 ENCOUNTER — Other Ambulatory Visit
Admission: RE | Admit: 2020-07-12 | Discharge: 2020-07-12 | Disposition: A | Discharge status: Home / Self Care | Payer: Medicare HMO | Source: Ambulatory Visit | Attending: General Surgery | Admitting: General Surgery

## 2020-07-12 DIAGNOSIS — Z01818 Encounter for other preprocedural examination: Secondary | ICD-10-CM | POA: Insufficient documentation

## 2020-07-12 DIAGNOSIS — I10 Essential (primary) hypertension: Secondary | ICD-10-CM | POA: Diagnosis not present

## 2020-07-12 LAB — CBC
HCT: 36.2 % (ref 36.0–46.0)
Hemoglobin: 11.9 g/dL — ABNORMAL LOW (ref 12.0–15.0)
MCH: 30.1 pg (ref 26.0–34.0)
MCHC: 32.9 g/dL (ref 30.0–36.0)
MCV: 91.4 fL (ref 80.0–100.0)
Platelets: 263 10*3/uL (ref 150–400)
RBC: 3.96 MIL/uL (ref 3.87–5.11)
RDW: 14.6 % (ref 11.5–15.5)
WBC: 4.8 10*3/uL (ref 4.0–10.5)
nRBC: 0 % (ref 0.0–0.2)

## 2020-07-12 LAB — BASIC METABOLIC PANEL
Anion gap: 9 (ref 5–15)
BUN: 27 mg/dL — ABNORMAL HIGH (ref 8–23)
CO2: 21 mmol/L — ABNORMAL LOW (ref 22–32)
Calcium: 9.9 mg/dL (ref 8.9–10.3)
Chloride: 109 mmol/L (ref 98–111)
Creatinine, Ser: 1.78 mg/dL — ABNORMAL HIGH (ref 0.44–1.00)
GFR, Estimated: 29 mL/min — ABNORMAL LOW (ref >60)
Glucose, Bld: 186 mg/dL — ABNORMAL HIGH (ref 70–99)
Potassium: 4.2 mmol/L (ref 3.5–5.1)
Sodium: 139 mmol/L (ref 135–145)

## 2020-07-14 ENCOUNTER — Encounter: Payer: Self-pay | Admitting: General Surgery

## 2020-07-14 ENCOUNTER — Other Ambulatory Visit: Payer: Self-pay

## 2020-07-14 ENCOUNTER — Ambulatory Visit
Admission: RE | Admit: 2020-07-14 | Discharge: 2020-07-14 | Disposition: A | Discharge status: Home / Self Care | Payer: Medicare HMO | Attending: General Surgery | Admitting: General Surgery

## 2020-07-14 ENCOUNTER — Ambulatory Visit: Payer: Medicare HMO | Admitting: Urgent Care

## 2020-07-14 ENCOUNTER — Encounter
Admission: RE | Disposition: A | Discharge status: Home / Self Care | Payer: Self-pay | Source: Home / Self Care | Attending: General Surgery

## 2020-07-14 DIAGNOSIS — D351 Benign neoplasm of parathyroid gland: Secondary | ICD-10-CM | POA: Diagnosis not present

## 2020-07-14 DIAGNOSIS — Z885 Allergy status to narcotic agent status: Secondary | ICD-10-CM | POA: Diagnosis not present

## 2020-07-14 DIAGNOSIS — E119 Type 2 diabetes mellitus without complications: Secondary | ICD-10-CM | POA: Diagnosis not present

## 2020-07-14 DIAGNOSIS — Z87891 Personal history of nicotine dependence: Secondary | ICD-10-CM | POA: Insufficient documentation

## 2020-07-14 DIAGNOSIS — Z79899 Other long term (current) drug therapy: Secondary | ICD-10-CM | POA: Insufficient documentation

## 2020-07-14 DIAGNOSIS — I10 Essential (primary) hypertension: Secondary | ICD-10-CM | POA: Diagnosis not present

## 2020-07-14 DIAGNOSIS — Z7982 Long term (current) use of aspirin: Secondary | ICD-10-CM | POA: Diagnosis not present

## 2020-07-14 DIAGNOSIS — E785 Hyperlipidemia, unspecified: Secondary | ICD-10-CM | POA: Diagnosis not present

## 2020-07-14 DIAGNOSIS — Z8673 Personal history of transient ischemic attack (TIA), and cerebral infarction without residual deficits: Secondary | ICD-10-CM | POA: Diagnosis not present

## 2020-07-14 DIAGNOSIS — I129 Hypertensive chronic kidney disease with stage 1 through stage 4 chronic kidney disease, or unspecified chronic kidney disease: Secondary | ICD-10-CM | POA: Insufficient documentation

## 2020-07-14 DIAGNOSIS — K219 Gastro-esophageal reflux disease without esophagitis: Secondary | ICD-10-CM | POA: Insufficient documentation

## 2020-07-14 DIAGNOSIS — E210 Primary hyperparathyroidism: Secondary | ICD-10-CM

## 2020-07-14 DIAGNOSIS — Z6841 Body Mass Index (BMI) 40.0 and over, adult: Secondary | ICD-10-CM | POA: Insufficient documentation

## 2020-07-14 DIAGNOSIS — Z794 Long term (current) use of insulin: Secondary | ICD-10-CM | POA: Diagnosis not present

## 2020-07-14 DIAGNOSIS — N184 Chronic kidney disease, stage 4 (severe): Secondary | ICD-10-CM | POA: Diagnosis not present

## 2020-07-14 HISTORY — DX: Chronic kidney disease, stage 4 (severe): N18.4

## 2020-07-14 HISTORY — PX: PARATHYROIDECTOMY: SHX19

## 2020-07-14 LAB — PARATHYROID HORMONE, INTRAOP (ARMC ONLY)
Parathyroid Hormone: 628 pg/mL — ABNORMAL HIGH (ref 12–88)
Parathyroid Hormone: 191 pg/mL — ABNORMAL HIGH (ref 12–88)
Parathyroid Hormone: 261 pg/mL — ABNORMAL HIGH (ref 12–88)
Parathyroid Hormone: 430 pg/mL — ABNORMAL HIGH (ref 12–88)

## 2020-07-14 LAB — GLUCOSE, CAPILLARY
Glucose-Capillary: 159 mg/dL — ABNORMAL HIGH (ref 70–99)
Glucose-Capillary: 161 mg/dL — ABNORMAL HIGH (ref 70–99)

## 2020-07-14 SURGERY — PARATHYROIDECTOMY
Anesthesia: General | Site: Neck

## 2020-07-14 MED ORDER — ACETAMINOPHEN 500 MG PO TABS
ORAL_TABLET | ORAL | Status: AC
  Administered 2020-07-14: 1000 mg via ORAL
  Filled 2020-07-14: qty 2

## 2020-07-14 MED ORDER — APREPITANT 40 MG PO CAPS: 40.0000 mg | ORAL_CAPSULE | Freq: Once | ORAL | Status: AC

## 2020-07-14 MED ORDER — REMIFENTANIL HCL 1 MG IV SOLR
INTRAVENOUS | Status: AC
  Filled 2020-07-14: qty 1000

## 2020-07-14 MED ORDER — CHLORHEXIDINE GLUCONATE 0.12 % MT SOLN
OROMUCOSAL | Status: AC
  Administered 2020-07-14: 15 mL via OROMUCOSAL
  Filled 2020-07-14: qty 15

## 2020-07-14 MED ORDER — PROPOFOL 10 MG/ML IV BOLUS
INTRAVENOUS | Status: AC
  Filled 2020-07-14: qty 20

## 2020-07-14 MED ORDER — CHLORHEXIDINE GLUCONATE CLOTH 2 % EX PADS
6.0000 | MEDICATED_PAD | Freq: Once | CUTANEOUS | Status: AC
  Administered 2020-07-14: 6 via TOPICAL

## 2020-07-14 MED ORDER — PROPOFOL 500 MG/50ML IV EMUL
INTRAVENOUS | Status: AC
  Filled 2020-07-14: qty 100

## 2020-07-14 MED ORDER — DEXAMETHASONE SODIUM PHOSPHATE 10 MG/ML IJ SOLN
INTRAMUSCULAR | Status: DC | PRN
  Administered 2020-07-14: 10 mg via INTRAVENOUS

## 2020-07-14 MED ORDER — HEMOSTATIC AGENTS (NO CHARGE) OPTIME
TOPICAL | Status: DC | PRN
  Administered 2020-07-14: 1 via TOPICAL

## 2020-07-14 MED ORDER — EPHEDRINE SULFATE 50 MG/ML IJ SOLN
INTRAMUSCULAR | Status: DC | PRN
  Administered 2020-07-14 (×2): 10 mg via INTRAVENOUS

## 2020-07-14 MED ORDER — SODIUM CHLORIDE 0.9 % IV SOLN
INTRAVENOUS | Status: DC | PRN
  Administered 2020-07-14: 20 ug/min via INTRAVENOUS

## 2020-07-14 MED ORDER — ACETAMINOPHEN 500 MG PO TABS: 1000.0000 mg | ORAL_TABLET | ORAL | Status: AC

## 2020-07-14 MED ORDER — SODIUM CHLORIDE 0.9 % IV SOLN: INTRAVENOUS | Status: DC

## 2020-07-14 MED ORDER — TRAMADOL HCL 50 MG PO TABS: 50.0000 mg | ORAL_TABLET | Freq: Four times a day (QID) | ORAL | 0 refills | Status: DC | PRN

## 2020-07-14 MED ORDER — APREPITANT 40 MG PO CAPS
ORAL_CAPSULE | ORAL | Status: AC
  Administered 2020-07-14: 40 mg via ORAL
  Filled 2020-07-14: qty 1

## 2020-07-14 MED ORDER — FENTANYL CITRATE (PF) 100 MCG/2ML IJ SOLN
INTRAMUSCULAR | Status: AC
  Filled 2020-07-14: qty 2

## 2020-07-14 MED ORDER — ORAL CARE MOUTH RINSE: 15.0000 mL | Freq: Once | OROMUCOSAL | Status: AC

## 2020-07-14 MED ORDER — SUGAMMADEX SODIUM 500 MG/5ML IV SOLN
INTRAVENOUS | Status: DC | PRN
  Administered 2020-07-14: 200 mg via INTRAVENOUS

## 2020-07-14 MED ORDER — CHLORHEXIDINE GLUCONATE 0.12 % MT SOLN: 15.0000 mL | Freq: Once | OROMUCOSAL | Status: AC

## 2020-07-14 MED ORDER — PHENYLEPHRINE HCL (PRESSORS) 10 MG/ML IV SOLN
INTRAVENOUS | Status: DC | PRN
  Administered 2020-07-14 (×3): 100 ug via INTRAVENOUS

## 2020-07-14 MED ORDER — SUCCINYLCHOLINE CHLORIDE 200 MG/10ML IV SOSY
PREFILLED_SYRINGE | INTRAVENOUS | Status: AC
  Filled 2020-07-14: qty 10

## 2020-07-14 MED ORDER — FENTANYL CITRATE (PF) 100 MCG/2ML IJ SOLN
INTRAMUSCULAR | Status: DC | PRN
  Administered 2020-07-14 (×2): 50 ug via INTRAVENOUS

## 2020-07-14 MED ORDER — ROCURONIUM BROMIDE 100 MG/10ML IV SOLN
INTRAVENOUS | Status: DC | PRN
  Administered 2020-07-14: 50 mg via INTRAVENOUS
  Administered 2020-07-14: 20 mg via INTRAVENOUS

## 2020-07-14 MED ORDER — ONDANSETRON HCL 4 MG/2ML IJ SOLN: 4.0000 mg | Freq: Once | INTRAMUSCULAR | Status: DC | PRN

## 2020-07-14 MED ORDER — SODIUM CHLORIDE 0.9 % IV SOLN
INTRAVENOUS | Status: DC | PRN
  Administered 2020-07-14: .1 ug/kg/min via INTRAVENOUS

## 2020-07-14 MED ORDER — LIDOCAINE HCL (CARDIAC) PF 100 MG/5ML IV SOSY
PREFILLED_SYRINGE | INTRAVENOUS | Status: DC | PRN
  Administered 2020-07-14: 50 mg via INTRAVENOUS

## 2020-07-14 MED ORDER — LACTATED RINGERS IV SOLN: INTRAVENOUS | Status: DC | PRN

## 2020-07-14 MED ORDER — SODIUM CHLORIDE (PF) 0.9 % IJ SOLN
INTRAMUSCULAR | Status: AC
  Filled 2020-07-14: qty 20

## 2020-07-14 MED ORDER — FENTANYL CITRATE (PF) 100 MCG/2ML IJ SOLN: 25.0000 ug | INTRAMUSCULAR | Status: DC | PRN

## 2020-07-14 MED ORDER — ONDANSETRON HCL 4 MG/2ML IJ SOLN
INTRAMUSCULAR | Status: DC | PRN
  Administered 2020-07-14: 4 mg via INTRAVENOUS

## 2020-07-14 SURGICAL SUPPLY — 50 items
ADH SKN CLS APL DERMABOND .7 (GAUZE/BANDAGES/DRESSINGS) ×1
BACTOSHIELD CHG 4% 4OZ (MISCELLANEOUS) ×1
BASIN GRAD PLASTIC 32OZ STRL (MISCELLANEOUS) ×2 IMPLANT
BLADE SURG 15 STRL LF DISP TIS (BLADE) ×1 IMPLANT
BLADE SURG 15 STRL SS (BLADE) ×2
CANISTER SUCT 1200ML W/VALVE (MISCELLANEOUS) ×2 IMPLANT
CLIP VESOCCLUDE SM WIDE 6/CT (CLIP) ×2 IMPLANT
COVER WAND RF STERILE (DRAPES) ×2 IMPLANT
DERMABOND ADVANCED (GAUZE/BANDAGES/DRESSINGS) ×1
DERMABOND ADVANCED .7 DNX12 (GAUZE/BANDAGES/DRESSINGS) ×1 IMPLANT
DRAPE LAPAROTOMY 77X122 PED (DRAPES) ×2 IMPLANT
DRAPE MAG INST 16X20 L/F (DRAPES) ×2 IMPLANT
DRSG TEGADERM 2-3/8X2-3/4 SM (GAUZE/BANDAGES/DRESSINGS) IMPLANT
ELECT CAUTERY BLADE TIP 2.5 (TIP) ×2
ELECT LARYNGEAL DUAL CHAN (ELECTRODE) IMPLANT
ELECT NEEDLE 20X.3 GREEN (MISCELLANEOUS)
ELECT REM PT RETURN 9FT ADLT (ELECTROSURGICAL) ×2
ELECTRODE CAUTERY BLDE TIP 2.5 (TIP) ×1 IMPLANT
ELECTRODE NDL 20X.3 GREEN (MISCELLANEOUS) IMPLANT
ELECTRODE NEEDLE 20X.3 GREEN (MISCELLANEOUS) IMPLANT
ELECTRODE REM PT RTRN 9FT ADLT (ELECTROSURGICAL) ×1 IMPLANT
GAUZE 4X4 16PLY RFD (DISPOSABLE) IMPLANT
GLOVE SURG ENC MOIS LTX SZ6.5 (GLOVE) ×5 IMPLANT
GLOVE SURG UNDER LTX SZ7 (GLOVE) ×5 IMPLANT
GOWN STRL REUS W/ TWL LRG LVL3 (GOWN DISPOSABLE) ×2 IMPLANT
GOWN STRL REUS W/TWL LRG LVL3 (GOWN DISPOSABLE) ×4
HEMOSTAT SNOW SURGICEL 2X4 (HEMOSTASIS) ×2 IMPLANT
KIT TURNOVER KIT A (KITS) ×2 IMPLANT
LABEL OR SOLS (LABEL) ×2 IMPLANT
MANIFOLD NEPTUNE II (INSTRUMENTS) ×2 IMPLANT
NDL HYPO 25X1 1.5 SAFETY (NEEDLE) ×4 IMPLANT
NDL SAFETY ECLIPSE 18X1.5 (NEEDLE) ×4 IMPLANT
NEEDLE HYPO 18GX1.5 SHARP (NEEDLE) ×8
NEEDLE HYPO 25X1 1.5 SAFETY (NEEDLE) ×8 IMPLANT
NS IRRIG 500ML POUR BTL (IV SOLUTION) ×2 IMPLANT
PACK BASIN MINOR ARMC (MISCELLANEOUS) ×2 IMPLANT
PROBE NEUROSIGN BIPOL (MISCELLANEOUS) IMPLANT
PROBE NEUROSIGN BIPOLAR (MISCELLANEOUS)
SCRUB CHG 4% DYNA-HEX 4OZ (MISCELLANEOUS) ×1 IMPLANT
SHEARS HARMONIC 9CM CVD (BLADE) IMPLANT
SPONGE KITTNER 5P (MISCELLANEOUS) ×2 IMPLANT
STRIP CLOSURE SKIN 1/2X4 (GAUZE/BANDAGES/DRESSINGS) ×2 IMPLANT
SUT MNCRL AB 4-0 PS2 18 (SUTURE) IMPLANT
SUT PROLENE 4 0 PS 2 18 (SUTURE) ×2 IMPLANT
SUT SILK 2 0 (SUTURE) ×2
SUT SILK 2-0 18XBRD TIE 12 (SUTURE) ×1 IMPLANT
SUT VIC AB 4-0 RB1 27 (SUTURE) ×2
SUT VIC AB 4-0 RB1 27X BRD (SUTURE) ×1 IMPLANT
SYR 3ML LL SCALE MARK (SYRINGE) ×8 IMPLANT
SYR BULB IRRIG 60ML STRL (SYRINGE) ×2 IMPLANT

## 2020-07-14 NOTE — Transfer of Care (Signed)
Immediate Anesthesia Transfer of Care Note  Patient: Alexis Greer  Procedure(s) Performed: PARATHYROIDECTOMY, RNFA to assist (N/A Neck)  Patient Location: PACU  Anesthesia Type:MAC and General  Level of Consciousness: awake  Airway & Oxygen Therapy: Patient Spontanous Breathing  Post-op Assessment: Report given to RN  Post vital signs: Reviewed  Last Vitals:  Vitals Value Taken Time  BP 119/59 07/14/20 1033  Temp 36 C 07/14/20 1033  Pulse 86 07/14/20 1036  Resp 14 07/14/20 1036  SpO2 92 % 07/14/20 1036  Vitals shown include unvalidated device data.  Last Pain:  Vitals:   07/14/20 1033  TempSrc:   PainSc: 0-No pain         Complications: No complications documented.

## 2020-07-14 NOTE — Interval H&P Note (Signed)
History and Physical Interval Note:  07/14/2020 8:13 AM  Alexis Greer  has presented today for surgery, with the diagnosis of primary hyperparathyroidism.  The various methods of treatment have been discussed with the patient and family. After consideration of risks, benefits and other options for treatment, the patient has consented to  Procedure(s): PARATHYROIDECTOMY, RNFA to assist (N/A) as a surgical intervention.  The patient's history has been reviewed, patient examined, no change in status, stable for surgery.  I have reviewed the patient's chart and labs.  Questions were answered to the patient's satisfaction.     Fredirick Maudlin

## 2020-07-14 NOTE — Anesthesia Postprocedure Evaluation (Signed)
Anesthesia Post Note  Patient: Alexis Greer  Procedure(s) Performed: PARATHYROIDECTOMY, RNFA to assist (N/A Neck)  Patient location during evaluation: PACU Anesthesia Type: General Level of consciousness: awake and alert Pain management: pain level controlled Vital Signs Assessment: post-procedure vital signs reviewed and stable Respiratory status: spontaneous breathing, nonlabored ventilation, respiratory function stable and patient connected to nasal cannula oxygen Cardiovascular status: blood pressure returned to baseline and stable Postop Assessment: no apparent nausea or vomiting Anesthetic complications: no   No complications documented.   Last Vitals:  Vitals:   07/14/20 1054 07/14/20 1107  BP: (!) 135/58 (!) 140/58  Pulse: 80 83  Resp: 15 16  Temp: (!) 36.3 C   SpO2: 98% 94%    Last Pain:  Vitals:   07/14/20 1107  TempSrc:   PainSc: 2                  Molli Barrows

## 2020-07-14 NOTE — Anesthesia Procedure Notes (Signed)
Procedure Name: Intubation Performed by: Timoteo Expose, CRNA Pre-anesthesia Checklist: Patient identified, Emergency Drugs available, Suction available and Patient being monitored Patient Re-evaluated:Patient Re-evaluated prior to induction Oxygen Delivery Method: Circle system utilized Preoxygenation: Pre-oxygenation with 100% oxygen Induction Type: IV induction Ventilation: Mask ventilation without difficulty Laryngoscope Size: McGraph and 3 Grade View: Grade I Tube type: Oral Tube size: 6.5 mm Number of attempts: 1 Airway Equipment and Method: Stylet and Oral airway Placement Confirmation: ETT inserted through vocal cords under direct vision,  positive ETCO2 and breath sounds checked- equal and bilateral Tube secured with: Tape Dental Injury: Teeth and Oropharynx as per pre-operative assessment

## 2020-07-14 NOTE — Discharge Instructions (Signed)
Post-operative Home Care After Thyroid or Parathyroid Surgery  What should I expect after my operation?   Alexis Greer You will see swelling under the incision and/or bruising under it in a few days. This is usually greatest on the second or third day after the operation. You may also feel the sensation of swelling and/or of firmness that can last for a month or more.    . Neck incisions heal rapidly, within a week or two. You can get them damp after about 24 hours, however do not submerge the incision or allow it to become soaked or saturated with water or sweat for 2 weeks.   . Your scar will be most visible 1-2 months after the operation and gradually fade over the next 6-8 months. As it heals, a scar looks more pink or red than the skin around it.   Dennis Bast may feel a firm 'healing ridge' directly under the incision. This is normal and will soften and go away when healing is complete within 3-6 months.   . All incisions are sensitive to sunlight.  For one year after surgery, you should use sunscreen when outdoors for long periods to prevent darkening of the scar area.    . We recommend that you not expose the incision to the ultraviolet lights used in tanning booths.    Will my neck hurt?  . Most patients experience very little pain, but you may feel some neck stiffness/soreness in your shoulder, back or neck and tension headache that takes a few days or weeks to go away completely.    . Some patients also notice minor changes in swallowing, which improve over time.   . The skin just above and below your incision will feel numb. This will improve over several months, but some persons may have a longterm decrease in sensation.   .  You may apply cold pack over your incision to improve the pain.    How will I manage my pain at home?  . Take NSAIDS like ibuprofen (Motrin, Advil), naproxen (Naprosyn, Aleve) or acetaminophen (Tylenol) every 6 hours for the first 3-5 days after operation. This will  minimize the pain you feel.      ? To prevent Tylenol overdose, do not take a Tylenol doses at the same time as a combination narcotic dose that contains Tylenol, like Norco and Percocet. However, You may take them 4-6 hours apart.     . If you have sore/stiff muscles in your back, shoulder or neck, you may use moist warm heat or heating pad on the affected areas 15-20 minutes at a time several times a day.   . Gently massaging your neck muscles will improve the neck stiffness.    . Do not be afraid to move your neck. Gently flexing and stretching your neck muscles will prevent stiffness.    . Stronger pain medication or narcotic (like Norco, Percocet) for severe pain is rarely needed. If it is, however, DO NOT drive a car or drink alcohol while taking these medications.    . Narcotics also cause constipation. Stool softeners (Colace) and fiber (fruits, bran, vegetables) and extra fluid intake helps. A stimulant laxative (Milk of Magnesia, Senokot) may be needed as well.    Will my voice be affected?  Your voice may be hoarse or weak at first, because the surgery took place near the voice box, but usually recovers within weeks. Some patients also notice a change in the pitch of their voices that affects  singing. Rarely, these changes can be permanent.   Are there any diet restrictions?  No, return to your previous diet and always eat a well balanced diet, low in fat, etc.    How will I care for my incision?  . If you have paper "steri strips" on your incision, leave them in place until they begin to fall off naturally. If they become discolored or messy, you may remove them 7-10 days after your operation.   . If you have a skin glue (Dermabond) closure, you may notice tiny pieces of yellow material on your washcloth. Any sutures (stitches) you may have are dissolvable and do not need to be removed.  . You may shower then gently pat dry your incision.   . Do not apply ointments or powders.    . Avoid using Vitamin E cream or other moisturizers on the incision until after your first follow-up visit.   What new medications might I take home?   ? Calcium supplement:  Your body's calcium levels may fall after a total thyroidectomy or parathyroid operation. We recommend you purchase Os-Cal 500 (one tablet equals 500 mg of elemental calcium) or Citracal (2 tablets equal 630 mg of elemental calcium; the "Petites" version contains 500 mg elemental calcium per 2 tablets). You may be taking 3-6 (or more) tablets per day, depending on your doctor's recommendation. You will need to take calcium at different times to avoid medication interaction. Ask your pharmacists, nurse, or doctor about specific interactions.   ? Thyroid Hormone:  If you have had a thyroid operation, you may be prescribed thyroid hormone replacement, called levothyroxine (Synthroid, Levothroid, etc.). A blood test will be done in 6-8 weeks to ensure the dosage is correct  by your doctor or your surgeon.   ? Vitamin D:  If your doctor has prescribed a Vitamin D supplement, like Calcitriol (Rocaltrol), try to get it filled at the hospital pharmacy before you leave.  Sometimes regular pharmacies do not stock it and will need to order it in.  When can I go back to normal activities?  Alexis Greer You may return to work in 5-7 days or sooner if desired. Contact the clinic coordinator if you need employer forms completed.   . You may drive as long as you are not taking any narcotics and your neck stiffness is resolved    Can I resume my previous medications?   . Yes, unless directed not to by your doctor.   . Before discharge, be sure to review your previous medications with your doctor or inpatient medical team.    When do I call for advice?   - If your temperature > 101.5  - You have trouble talking or breathing  - Your fingers/ hands or face or around your lips becomes numb and tingling. (This may mean your calcium level is low.)  -  You have trouble swallowing  - Your incision becomes swollen, red or drainage occurs.      Calcium supplementation: Please start taking 1 tablets of either Os-Cal 500 or Citrical 3 times daily. You may use Tums, instead, but please be sure to get the "ultra" variety.  Calcium can be quite constipating, so be sure to drink at least 64 oz of water or other non-caffeinated beverage daily. You can still drink caffeine in addition to this.  If necessary, you may use over the counter stool softeners or fiber supplements to help with constipation.   AMBULATORY SURGERY  DISCHARGE INSTRUCTIONS  1) The drugs that you were given will stay in your system until tomorrow so for the next 24 hours you should not:  A) Drive an automobile B) Make any legal decisions C) Drink any alcoholic beverage   2) You may resume regular meals tomorrow.  Today it is better to start with liquids and gradually work up to solid foods.  You may eat anything you prefer, but it is better to start with liquids, then soup and crackers, and gradually work up to solid foods.   3) Please notify your doctor immediately if you have any unusual bleeding, trouble breathing, redness and pain at the surgery site, drainage, fever, or pain not relieved by medication.  4) Your post-operative visit with Dr.                                     is: Date:                        Time:    Please call to schedule your post-operative visit.  5) Additional Instructions:

## 2020-07-14 NOTE — Anesthesia Preprocedure Evaluation (Signed)
Anesthesia Evaluation  Patient identified by MRN, date of birth, ID band Patient awake    Reviewed: Allergy & Precautions, NPO status , Patient's Chart, lab work & pertinent test results  History of Anesthesia Complications (+) history of anesthetic complications (? TIA with breast surgery)  Airway Mallampati: II  TM Distance: >3 FB Neck ROM: Limited    Dental  (+) Upper Dentures, Lower Dentures   Pulmonary neg sleep apnea (resolved), neg COPD, Not current smoker, former smoker,    breath sounds clear to auscultation       Cardiovascular hypertension, Pt. on medications (-) Past MI and (-) CHF (-) dysrhythmias (-) Valvular Problems/Murmurs Rhythm:Regular Rate:Normal     Neuro/Psych neg Seizures "both knees hurt" but this sounds more arthritic than post-CVA weakness or numbness CVA (weak knees, ), Residual Symptoms    GI/Hepatic Neg liver ROS, GERD  Medicated and Controlled,  Endo/Other  diabetes, Type 2, Oral Hypoglycemic AgentsMorbid obesity  Renal/GU Renal InsufficiencyRenal disease     Musculoskeletal   Abdominal (+) + obese,   Peds  Hematology   Anesthesia Other Findings   Reproductive/Obstetrics                             Anesthesia Physical  Anesthesia Plan  ASA: III  Anesthesia Plan: General   Post-op Pain Management:    Induction: Intravenous  PONV Risk Score and Plan: 3 and Propofol infusion, TIVA, Treatment may vary due to age or medical condition, Dexamethasone and Ondansetron  Airway Management Planned: Oral ETT  Additional Equipment: None  Intra-op Plan:   Post-operative Plan: Extubation in OR  Informed Consent: I have reviewed the patients History and Physical, chart, labs and discussed the procedure including the risks, benefits and alternatives for the proposed anesthesia with the patient or authorized representative who has indicated his/her understanding and  acceptance.     Dental advisory given  Plan Discussed with: CRNA and Surgeon  Anesthesia Plan Comments: (Discussed risks of anesthesia with patient, including PONV, sore throat, lip/dental damage. Rare risks discussed as well, such as cardiorespiratory and neurological sequelae. Patient understands. Patient informed about increased incidence of above perioperative risk due to high BMI. Patient understands. )        Anesthesia Quick Evaluation

## 2020-07-14 NOTE — Op Note (Signed)
Operative Note  Preoperative Diagnosis:  primary hyperparathyroidism  Postoperative Diagnosis:  primary hyperparathyroidism  Operation:  focused/minimally invasive parathyroidectomy with intraoperative PTH monitoring  Surgeon: Fredirick Maudlin, MD  Assistant: RNFA  Anesthesia: General endotracheal  Findings: There was an enlarged right inferior parathyroid gland, located in a subcapsular position.  This was consistent with preoperative imaging.  Intraoperative PTH levels were as follows: Baseline: 628 Pre-excision: 430 5-minute: 261 10-minute: 191 This is greater than 50% drop and represents adequate excision of a hyper secreting tissue according to the [facility] criteria.  Indications: Alexis Greer is a 80 year old woman with biochemical evidence of primary hyperparathyroidism.  She meets criteria for surgery based upon her decreased renal function.  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  Parathyroidectomy was recommended.  The risks of the surgery were discussed with her and she agreed to proceed.  Procedure In Detail: The patient was identified in the preoperative holding area and brought to the operating room where she was placed supine on the OR table.  Bony prominences were padded and bilateral sequential compression devices were placed on the lower extremities.  General endotracheal anesthesia was induced without incident.  She was positioned appropriately for the operation and sterilely prepped and draped in standard fashion.  A timeout was performed confirming her identity, the procedure being performed, her allergies, all necessary equipment was available, and that maintenance anesthesia was adequate.  A 5 cm transverse incision was made in the natural skin fold that was appropriately positioned for the surgery.  This was carried down through the skin and subcutaneous tissues using electrocautery.  Subplatysmal flaps were elevated.  The strap muscles  were divided in the median raphae.  The sternal hyoid was elevated off of the sternothyroid on the right to expose internal jugular vein.  A baseline PTH level was drawn and sent to the lab.  We then elevated the strap muscles off of the thyroid tissue.  Almost immediately, an enlarged parathyroid adenoma was appreciated just under the capsule of the thyroid at the inferior pole.  It was carefully dissected free from the surrounding tissues and elevated on its vascular pedicle which was then divided.  It was handed off as a specimen.  At the time of excision, a PTH level was drawn.  5 and 10 minutes post excision, additional PTH levels were drawn and sent to the lab.  We irrigated the wound bed and obtained good hemostasis.  A Valsalva maneuver from the anesthesia team confirmed no surgical bleeding.  We applied SNoW over the venipuncture site and in the wound bed for additional hemostasis.  The strap muscles were closed in the midline with running 4-0 Vicryl.  The platysma was closed with interrupted Vicryl.  The skin was closed with running subcuticular Prolene.  We awaited the results of her PTH levels and these returned as discussed under findings.  The skin was cleaned.  Dermabond and Steri-Strips were applied.  The Prolene suture was removed.  The patient was awakened, extubated, and taken to the postanesthesia care unit in good condition.  EBL: Less than 1 cc  IVF: See anesthesia record  Specimen(s): Right inferior parathyroid gland to pathology for permanent section  Complications: none immediately apparent.   Counts: all needles, instruments, and sponges were counted and reported to be correct in number at the end of the case.   I was present for and participated in the entire operation.  Fredirick Maudlin 10:35 AM

## 2020-07-15 ENCOUNTER — Encounter: Payer: Self-pay | Admitting: General Surgery

## 2020-07-15 LAB — SURGICAL PATHOLOGY

## 2020-07-27 ENCOUNTER — Encounter: Payer: Self-pay | Admitting: General Surgery

## 2020-07-27 ENCOUNTER — Ambulatory Visit (INDEPENDENT_AMBULATORY_CARE_PROVIDER_SITE_OTHER): Payer: Medicare HMO | Admitting: General Surgery

## 2020-07-27 ENCOUNTER — Other Ambulatory Visit: Payer: Self-pay

## 2020-07-27 VITALS — BP 163/78 | HR 95 | Temp 98.4°F | Ht 62.0 in | Wt 229.0 lb

## 2020-07-27 DIAGNOSIS — E892 Postprocedural hypoparathyroidism: Secondary | ICD-10-CM

## 2020-07-27 NOTE — Patient Instructions (Signed)
Starting now take your Calcium twice a day for 7 days. Then reduce this to once a day for 7 days. After that you may continue the once a day Calcium or stop it. Go back on the calcium if you start to experience any tingling in your lips, nose, or fingers.   You may use heat to the back of the neck and shoulder several times a day for stiffness and discomfort. Do neck range of motion and stretching.   May rub Vitamin-E oil or other emmolient agent in area 2-3 times a day to soften. You will need to use sunscreen for the next year on the area to minimize altered pigmentation of the site.  Follow-up with our office as needed.  Please call and ask to speak with a nurse if you develop questions or concerns.

## 2020-07-27 NOTE — Progress Notes (Signed)
Alexis Greer is here today for a postoperative visit.  She is a 80 year old woman who underwent parathyroidectomy on July 14, 2020.  The right inferior parathyroid gland was removed.  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  Intraoperatively, her PTH dropped from a baseline level of 628 to a 10-minute post excision level of 191, greater than 50% and consistent with adequate surgical excision of all hyper secreting tissue.  She reports that she has been doing well since her operation.  She denies any significant pain.  No voice changes or difficulty swallowing.  She denies any paresthesias or other symptoms of hypocalcemia.  She is currently taking 500 mg of elemental calcium 3 times daily.  Her only complaint is some soreness in her neck and shoulder, which her daughter is helping to manage with an infrared light therapy device.  Today's Vitals   07/27/20 0854  BP: (!) 163/78  Pulse: 95  Temp: 98.4 F (36.9 C)  SpO2: 99%  Weight: 229 lb (103.9 kg)  Height: 5\' 2"  (1.575 m)   Body mass index is 41.88 kg/m. Focused neck examination: Steri-Strips are still in place.  These were removed to reveal a well approximated transverse incision.  There is no erythema, induration, or drainage present.  There is a slight healing ridge beneath the surface.  Impression and plan: This is a 80 year old woman who had biochemical evidence of primary hyperparathyroidism.  She underwent a single gland focused parathyroidectomy and experienced an appropriate drop in intraoperative PTH.  She denies any symptoms of hypocalcemia at this time.  Overall, she is doing well.  I have recommended that she decrease the calcium supplementation slowly as follows: Decreased from 3 times a day to twice a day for 1 week, then if no symptoms of hypocalcemia, decrease to 1 tablet daily for 1 week.  At that point, she may choose to continue on calcium supplementation for bone health, or discontinue it entirely.  We  also discussed scar care, including massage with a topical emollient agent to minimize tethering and improve cosmesis.  She should also apply sunscreen for the next year to avoid altered pigmentation of the skin from ultraviolet exposure.  She may continue to use the infrared light therapy device as desired, as she feels that she is deriving benefit from it.  At this time, she does not seem to have any ongoing endocrine surgical needs and I will see her on an as-needed basis.

## 2020-08-06 DIAGNOSIS — E782 Mixed hyperlipidemia: Secondary | ICD-10-CM | POA: Diagnosis not present

## 2020-08-06 DIAGNOSIS — E042 Nontoxic multinodular goiter: Secondary | ICD-10-CM | POA: Diagnosis not present

## 2020-08-06 DIAGNOSIS — E1122 Type 2 diabetes mellitus with diabetic chronic kidney disease: Secondary | ICD-10-CM | POA: Diagnosis not present

## 2020-08-06 DIAGNOSIS — E213 Hyperparathyroidism, unspecified: Secondary | ICD-10-CM | POA: Diagnosis not present

## 2020-08-06 DIAGNOSIS — Z794 Long term (current) use of insulin: Secondary | ICD-10-CM | POA: Diagnosis not present

## 2020-08-06 DIAGNOSIS — N184 Chronic kidney disease, stage 4 (severe): Secondary | ICD-10-CM | POA: Diagnosis not present

## 2020-08-06 DIAGNOSIS — K219 Gastro-esophageal reflux disease without esophagitis: Secondary | ICD-10-CM | POA: Diagnosis not present

## 2020-08-06 DIAGNOSIS — M1A9XX0 Chronic gout, unspecified, without tophus (tophi): Secondary | ICD-10-CM | POA: Diagnosis not present

## 2020-08-13 DIAGNOSIS — I10 Essential (primary) hypertension: Secondary | ICD-10-CM | POA: Diagnosis not present

## 2020-08-13 DIAGNOSIS — N184 Chronic kidney disease, stage 4 (severe): Secondary | ICD-10-CM | POA: Diagnosis not present

## 2020-08-13 DIAGNOSIS — N2581 Secondary hyperparathyroidism of renal origin: Secondary | ICD-10-CM | POA: Diagnosis not present

## 2020-08-13 DIAGNOSIS — E11649 Type 2 diabetes mellitus with hypoglycemia without coma: Secondary | ICD-10-CM | POA: Diagnosis not present

## 2020-08-13 DIAGNOSIS — Z794 Long term (current) use of insulin: Secondary | ICD-10-CM | POA: Diagnosis not present

## 2020-08-13 DIAGNOSIS — E1122 Type 2 diabetes mellitus with diabetic chronic kidney disease: Secondary | ICD-10-CM | POA: Diagnosis not present

## 2020-11-08 ENCOUNTER — Other Ambulatory Visit: Payer: Self-pay | Admitting: Family Medicine

## 2020-11-08 DIAGNOSIS — Z1231 Encounter for screening mammogram for malignant neoplasm of breast: Secondary | ICD-10-CM

## 2020-11-09 DIAGNOSIS — E11649 Type 2 diabetes mellitus with hypoglycemia without coma: Secondary | ICD-10-CM | POA: Diagnosis not present

## 2020-11-09 DIAGNOSIS — Z794 Long term (current) use of insulin: Secondary | ICD-10-CM | POA: Diagnosis not present

## 2020-11-16 DIAGNOSIS — Z794 Long term (current) use of insulin: Secondary | ICD-10-CM | POA: Diagnosis not present

## 2020-11-16 DIAGNOSIS — N2581 Secondary hyperparathyroidism of renal origin: Secondary | ICD-10-CM | POA: Diagnosis not present

## 2020-11-16 DIAGNOSIS — N184 Chronic kidney disease, stage 4 (severe): Secondary | ICD-10-CM | POA: Diagnosis not present

## 2020-11-16 DIAGNOSIS — E1122 Type 2 diabetes mellitus with diabetic chronic kidney disease: Secondary | ICD-10-CM | POA: Diagnosis not present

## 2020-11-16 DIAGNOSIS — E042 Nontoxic multinodular goiter: Secondary | ICD-10-CM | POA: Diagnosis not present

## 2020-11-16 DIAGNOSIS — I10 Essential (primary) hypertension: Secondary | ICD-10-CM | POA: Diagnosis not present

## 2020-12-08 ENCOUNTER — Ambulatory Visit
Admission: RE | Admit: 2020-12-08 | Discharge: 2020-12-08 | Disposition: A | Discharge status: Home / Self Care | Payer: Medicare HMO | Source: Ambulatory Visit | Attending: Family Medicine | Admitting: Family Medicine

## 2020-12-08 ENCOUNTER — Other Ambulatory Visit: Payer: Self-pay

## 2020-12-08 DIAGNOSIS — Z1231 Encounter for screening mammogram for malignant neoplasm of breast: Secondary | ICD-10-CM | POA: Diagnosis not present

## 2020-12-15 ENCOUNTER — Other Ambulatory Visit: Payer: Self-pay | Admitting: Family Medicine

## 2020-12-15 DIAGNOSIS — R928 Other abnormal and inconclusive findings on diagnostic imaging of breast: Secondary | ICD-10-CM

## 2020-12-15 DIAGNOSIS — R921 Mammographic calcification found on diagnostic imaging of breast: Secondary | ICD-10-CM

## 2020-12-20 ENCOUNTER — Other Ambulatory Visit: Payer: Self-pay

## 2020-12-20 ENCOUNTER — Ambulatory Visit
Admission: RE | Admit: 2020-12-20 | Discharge: 2020-12-20 | Disposition: A | Discharge status: Home / Self Care | Payer: Medicare HMO | Source: Ambulatory Visit | Attending: Family Medicine | Admitting: Family Medicine

## 2020-12-20 DIAGNOSIS — R928 Other abnormal and inconclusive findings on diagnostic imaging of breast: Secondary | ICD-10-CM | POA: Diagnosis not present

## 2020-12-20 DIAGNOSIS — R922 Inconclusive mammogram: Secondary | ICD-10-CM | POA: Diagnosis not present

## 2020-12-20 DIAGNOSIS — R921 Mammographic calcification found on diagnostic imaging of breast: Secondary | ICD-10-CM | POA: Diagnosis not present

## 2020-12-23 ENCOUNTER — Other Ambulatory Visit: Payer: Self-pay | Admitting: Family Medicine

## 2020-12-23 DIAGNOSIS — R928 Other abnormal and inconclusive findings on diagnostic imaging of breast: Secondary | ICD-10-CM

## 2020-12-23 DIAGNOSIS — R921 Mammographic calcification found on diagnostic imaging of breast: Secondary | ICD-10-CM

## 2020-12-27 ENCOUNTER — Ambulatory Visit
Admission: RE | Admit: 2020-12-27 | Discharge: 2020-12-27 | Disposition: A | Discharge status: Home / Self Care | Payer: Medicare HMO | Source: Ambulatory Visit | Attending: Family Medicine | Admitting: Family Medicine

## 2020-12-27 ENCOUNTER — Other Ambulatory Visit: Payer: Self-pay

## 2020-12-27 DIAGNOSIS — D0512 Intraductal carcinoma in situ of left breast: Secondary | ICD-10-CM | POA: Diagnosis not present

## 2020-12-27 DIAGNOSIS — R921 Mammographic calcification found on diagnostic imaging of breast: Secondary | ICD-10-CM

## 2020-12-27 DIAGNOSIS — R928 Other abnormal and inconclusive findings on diagnostic imaging of breast: Secondary | ICD-10-CM

## 2020-12-27 HISTORY — PX: BREAST BIOPSY: SHX20

## 2020-12-28 DIAGNOSIS — E892 Postprocedural hypoparathyroidism: Secondary | ICD-10-CM | POA: Diagnosis not present

## 2020-12-28 DIAGNOSIS — Z23 Encounter for immunization: Secondary | ICD-10-CM | POA: Diagnosis not present

## 2020-12-28 DIAGNOSIS — N1832 Chronic kidney disease, stage 3b: Secondary | ICD-10-CM | POA: Diagnosis not present

## 2020-12-28 DIAGNOSIS — E1122 Type 2 diabetes mellitus with diabetic chronic kidney disease: Secondary | ICD-10-CM | POA: Diagnosis not present

## 2020-12-28 DIAGNOSIS — Z794 Long term (current) use of insulin: Secondary | ICD-10-CM | POA: Diagnosis not present

## 2020-12-28 LAB — SURGICAL PATHOLOGY

## 2020-12-30 ENCOUNTER — Other Ambulatory Visit: Payer: Self-pay | Admitting: General Surgery

## 2020-12-30 DIAGNOSIS — D0512 Intraductal carcinoma in situ of left breast: Secondary | ICD-10-CM | POA: Diagnosis not present

## 2020-12-30 DIAGNOSIS — R809 Proteinuria, unspecified: Secondary | ICD-10-CM | POA: Diagnosis not present

## 2020-12-30 DIAGNOSIS — E210 Primary hyperparathyroidism: Secondary | ICD-10-CM | POA: Diagnosis not present

## 2020-12-30 DIAGNOSIS — I10 Essential (primary) hypertension: Secondary | ICD-10-CM | POA: Diagnosis not present

## 2020-12-30 DIAGNOSIS — E1122 Type 2 diabetes mellitus with diabetic chronic kidney disease: Secondary | ICD-10-CM | POA: Diagnosis not present

## 2020-12-30 DIAGNOSIS — K439 Ventral hernia without obstruction or gangrene: Secondary | ICD-10-CM | POA: Diagnosis not present

## 2020-12-30 DIAGNOSIS — N184 Chronic kidney disease, stage 4 (severe): Secondary | ICD-10-CM | POA: Diagnosis not present

## 2020-12-30 NOTE — Progress Notes (Signed)
Subjective:     Patient ID: Alexis Greer is a 80 y.o. female.   HPI   The following portions of the patient's history were reviewed and updated as appropriate.   This a new patient is here today for: office visit. She is here for evaluation of left breast cancer referred by Al Pimple RN. She states she could not feel anything different, it was found on routine mammogram. She has a history of right breast cancer in 2016.   She is here with her daughter, [name].   Review of Systems  Constitutional: Negative for chills and fever.  Respiratory: Negative for cough.          Chief Complaint  Patient presents with   Breast Problem      BP 134/62   Pulse 92   Temp 36.8 C (98.2 F)   Ht 157.5 cm ('5\' 2"' )   Wt (!) 107 kg (236 lb)   LMP  (LMP Unknown)   SpO2 98%   BMI 43.16 kg/m        Past Medical History:  Diagnosis Date   Adrenal nodule (CMS-HCC) 10/2011    2.5 cm left, on CT   Allergic state     Anemia     Arthritis     Asthma without status asthmaticus, unspecified     B12 deficiency     Cataract cortical, senile     Complication of anesthesia     Dyspnea     GERD (gastroesophageal reflux disease)     History of gout     History of radiation therapy 2016   Hypercalcemia and hyperparathyroidism      Resolved, s/p right neck inferior parathyroid adenoma (surgeon Dr Celine Ahr, [facility]) on 07/14/2020   Hyperlipidemia     Hypertension     Pneumonia     Right breast cancer (CMS-HCC) 2016    s/p right breast lumpectomy and SLNBx, s/p radiation therapy   Stage 3 chronic kidney disease (CMS-HCC)     Stroke (CMS-HCC) 2016   Tubular adenoma of colon 06/13/2016   Type 2 diabetes mellitus (CMS-HCC)     Vitamin D deficiency             Past Surgical History:  Procedure Laterality Date   affirm breast biopsy Left 12/27/2020   CATARACT EXTRACTION Bilateral     CHOLECYSTECTOMY   2020   COLONOSCOPY   06/13/2016    Tubular adenoma of colon/Repeat 30yr/MUS    COLONOSCOPY   12/03/2019    Tubular adenomas/PHx CP/No Repeat due to age/TKT   history of breast biopsy Right 11/17/2014   HYSTERECTOMY   1979   lower extremity intervention Left 11/09/2016    Dr. DLucky Cowboy  MASTECTOMY PARTIAL / LUMPECTOMY Right 12/01/2014    Breast Cancer   OOPHORECTOMY   2014   PARATHYROIDECTOMY   07/14/2020   TONSILLECTOMY            OB History   No obstetric history on file.     Obstetric Comments  Age at first period Age of first pregnancy            Social History           Socioeconomic History   Marital status: Single  Tobacco Use   Smoking status: Former Smoker      Types: Cigarettes      Quit date: 08/25/1993      Years since quitting: 27.3   Smokeless tobacco: Never Used  Substance and  Sexual Activity   Alcohol use: No   Drug use: No   Sexual activity: Never  Social History Narrative    Prevnar: 2018             Allergies  Allergen Reactions   Oxycodone Other (See Comments)      It made the patient have shakes and jittery       Current Medications        Current Outpatient Medications  Medication Sig Dispense Refill   ACCU-CHEK AVIVA PLUS METER Misc Use to check blood sugars daily 1 kit 0   acetaminophen (TYLENOL) 650 MG ER tablet Take 1 tablet every 4-6 hours as needed for pain 30 tablet 0   allopurinoL (ZYLOPRIM) 100 MG tablet TAKE 1 TABLET TWICE DAILY 180 tablet 3   amLODIPine (NORVASC) 10 MG tablet TAKE 1 TABLET EVERY DAY 90 tablet 3   aspirin 81 MG EC tablet Take 81 mg by mouth once daily.       atorvastatin (LIPITOR) 40 MG tablet TAKE 1 TABLET EVERY NIGHT 90 tablet 3   blood glucose diagnostic (ACCU-CHEK AVIVA PLUS TEST STRP) test strip 2 (two) times daily Use as instructed. 200 strip 3   cholecalciferol, vitamin D3, (VITAMIN D3) 125 mcg (5,000 unit) tablet 1 tablet daily - dose unknown         DROPLET PEN NEEDLE 31 gauge x 3/16" needle USE TWICE DAILY 200 each 2   ezetimibe (ZETIA) 10 mg tablet TAKE 1 TABLET EVERY DAY 90  tablet 1   insulin NPH-REGULAR (HUMULIN 70/30) 100 unit/mL (70-30) injection Inject 20 units with breakfast and 36 units before dinner. If sugar is under 70 only take half dose. (Patient taking differently: Inject 20 units with breakfast and 38 units before dinner. If sugar is under 70 only take half dose.) 10 mL 0   losartan (COZAAR) 25 MG tablet TAKE 1 TABLET EVERY DAY 90 tablet 3   magnesium oxide (MAG-OX) 400 mg (241.3 mg magnesium) tablet TAKE 1 TABLET TWICE DAILY 180 tablet 1   metoprolol succinate (TOPROL-XL) 25 MG XL tablet Take 1 tablet (25 mg total) by mouth once daily 90 tablet 3   multivitamin tablet Take 1 tablet by mouth once daily.       omeprazole (PRILOSEC) 20 MG DR capsule TAKE 1 CAPSULE TWICE DAILY 180 capsule 3   pen needle, diabetic (BD ULTRA-FINE MINI PEN NEEDLE) 31 gauge x 3/16" needle 2 (two) times daily 200 each 1   VITAMIN B-12 1,000 mcg SL tablet Place 1,000 mcg under the tongue once daily         vitamin E 400 UNIT capsule Take 1 capsule by mouth once daily          No current facility-administered medications for this visit.             Family History  Problem Relation Age of Onset   Heart disease Mother     High blood pressure (Hypertension) Mother     Hyperlipidemia (Elevated cholesterol) Father     Coronary Artery Disease (Blocked arteries around heart) Father     Lung cancer Sister     Diabetes type II Brother     Colon cancer Brother     Thyroid disease Daughter     Breast cancer Neg Hx               Objective:   Physical Exam Exam conducted with a chaperone present.  Constitutional:  Appearance: Normal appearance.  Cardiovascular:     Rate and Rhythm: Normal rate. Rhythm irregular.     Pulses: Normal pulses.     Heart sounds: Normal heart sounds.  Pulmonary:     Effort: Pulmonary effort is normal.     Breath sounds: Normal breath sounds.  Chest:  Breasts:     Right: Normal.     Left: Normal.         Comments: Firm area superior  to the upper inner quadrant right excision site on the right consistent with prior MammoSite treatment. Abdominal:       Comments: Incision above the level of the umbilicus with a 8 cm mass of nonreducible omentum.  Possibly related to cholecystectomy in 2020.  Musculoskeletal:     Cervical back: Neck supple.  Lymphadenopathy:     Upper Body:     Right upper body: No supraclavicular or axillary adenopathy.     Left upper body: No supraclavicular or axillary adenopathy.  Skin:    General: Skin is warm and dry.  Neurological:     Mental Status: She is alert and oriented to person, place, and time.  Psychiatric:        Mood and Affect: Mood normal.        Behavior: Behavior normal.      Labs and Radiology:    December 27, 2020 pathology:   DIAGNOSIS:  A. BREAST ASYMMETRY WITH CALCIFICATIONS, LEFT UPPER OUTER; STEREOTACTIC  BIOPSY:  - HIGH-GRADE DUCTAL CARCINOMA IN SITU (DCIS), COMEDO TYPE WITH  ASSOCIATED CALCIFICATIONS.   Comment:  DCIS is present in 3 of 5 blocks. IHC for estrogen receptor is deferred  to an excision specimen.   This area measures about 8 mm in maximum diameter.  Some calcifications are evident.   Mammogram review:   Review of the September 14 through December 27, 2020 mammograms was completed.  Comparison was made to the December 05, 2019 and January 19, 2019 studies.  Developing density in the central portion of the left breast.   Postbiopsy mammogram show no residual density in the area of original biopsy.   Ultrasound:   Limited ultrasound was completed to determine if preoperative wire localization would be required.  After only 3 days there is no residual distortion in the area and wire localization will be required.     Laboratory review:   Aug 06, 2020:   Component Ref Range & Units 4 mo ago   WBC (White Blood Cell Count) 4.1 - 10.2 10^3/uL 6.1   RBC (Red Blood Cell Count) 4.04 - 5.48 10^6/uL 3.93 Low    Hemoglobin 12.0 - 15.0 gm/dL 11.7 Low     Hematocrit 35.0 - 47.0 % 36.6   MCV (Mean Corpuscular Volume) 80.0 - 100.0 fl 93.1   MCH (Mean Corpuscular Hemoglobin) 27.0 - 31.2 pg 29.8   MCHC (Mean Corpuscular Hemoglobin Concentration) 32.0 - 36.0 gm/dL 32.0   Platelet Count 150 - 450 10^3/uL 283   RDW-CV (Red Cell Distribution Width) 11.6 - 14.8 % 14.8   MPV (Mean Platelet Volume) 9.4 - 12.4 fl 9.2 Low    Neutrophils 1.50 - 7.80 10^3/uL 2.93   Lymphocytes 1.00 - 3.60 10^3/uL 2.14   Monocytes 0.00 - 1.50 10^3/uL 0.77   Eosinophils 0.00 - 0.55 10^3/uL 0.16   Basophils 0.00 - 0.09 10^3/uL 0.05   Neutrophil % 32.0 - 70.0 % 48.4   Lymphocyte % 10.0 - 50.0 % 35.3   Monocyte % 4.0 - 13.0 % 12.7  Eosinophil % 1.0 - 5.0 % 2.6   Basophil% 0.0 - 2.0 % 0.8   Immature Granulocyte % <=0.7 % 0.2   Immature Granulocyte Count <=0.06 10^3/L 0.01     November 09, 2020:   Glucose 70 - 110 mg/dL 175 High    Sodium 136 - 145 mmol/L 140   Potassium 3.6 - 5.1 mmol/L 5.0   Chloride 97 - 109 mmol/L 108   Carbon Dioxide (CO2) 22.0 - 32.0 mmol/L 25.5   Calcium 8.7 - 10.3 mg/dL 9.3   Urea Nitrogen (BUN) 7 - 25 mg/dL 33 High    Creatinine 0.6 - 1.1 mg/dL 2.0 High    Glomerular Filtration Rate (eGFR), MDRD Estimate >60 mL/min/1.73sq m 29 Low    BUN/Crea Ratio 6.0 - 20.0 16.5   Anion Gap w/K 6.0 - 16.0 11.5     Prior to parathyroidectomy calcium was running between 10.5 and 10.8.   PTH of Aug 06, 2020 remains elevated at 160.   Hemoglobin A1C 4.2 - 5.6 % 7.9 High      Average Blood Glucose (Calc) mg/dL 180       The patient reports that her morning blood sugars typically run between 130 and 150.      Assessment:     High-grade DCIS of the left breast.   Ventral hernia with incarcerated omentum, minimally symptomatic.    Plan:     The patient had a 5 mm triple negative tumor on the right breast in 2016 that did not require treatment besides surgery and radiation.  We reviewed the pathology of an in situ or DCIS cancer, and the  indications for surgical excision without node biopsy.  Likely candidate for postoperative radiation.   She did well with MammoSite treatment in 2016, potentially a candidate for same at this time.   The patient reports that her ventral hernia is gradually increasing in volume but remains asymptomatic.  With her hemoglobin A1c of 7.9, she would be at increased risk for recurrence or infectious complications, but potentially with a slightly better controlled this risk could be minimized.   Opportunity for second surgical opinion was reviewed.   The patient's daughter is taking her 48th anniversary cruise from her schooling the end of the month, and will likely postpone surgery until after that event.   They were encouraged to cal should they have they have any questions.  Informational brochure and website addresses were provided.    This note is partially prepared by Karie Fetch, RN, acting as a scribe in the presence of Dr. Hervey Ard, MD.  The documentation recorded by the scribe accurately reflects the service I personally performed and the decisions made by me.    Robert Bellow, MD FACS

## 2021-01-02 ENCOUNTER — Other Ambulatory Visit: Payer: Self-pay | Admitting: General Surgery

## 2021-01-02 DIAGNOSIS — D0512 Intraductal carcinoma in situ of left breast: Secondary | ICD-10-CM

## 2021-01-02 NOTE — Progress Notes (Signed)
Navigation initiated with patient, and daughter Jackelyn Poling.  Scheduled surgical consult with Dr. Bary Castilla.

## 2021-01-03 ENCOUNTER — Other Ambulatory Visit: Payer: Self-pay | Admitting: General Surgery

## 2021-01-03 DIAGNOSIS — D0512 Intraductal carcinoma in situ of left breast: Secondary | ICD-10-CM

## 2021-01-05 DIAGNOSIS — Z0100 Encounter for examination of eyes and vision without abnormal findings: Secondary | ICD-10-CM | POA: Diagnosis not present

## 2021-01-05 DIAGNOSIS — E7800 Pure hypercholesterolemia, unspecified: Secondary | ICD-10-CM | POA: Diagnosis not present

## 2021-01-05 DIAGNOSIS — I10 Essential (primary) hypertension: Secondary | ICD-10-CM | POA: Diagnosis not present

## 2021-01-05 DIAGNOSIS — H52229 Regular astigmatism, unspecified eye: Secondary | ICD-10-CM | POA: Diagnosis not present

## 2021-01-05 DIAGNOSIS — E109 Type 1 diabetes mellitus without complications: Secondary | ICD-10-CM | POA: Diagnosis not present

## 2021-01-14 ENCOUNTER — Encounter: Payer: Self-pay | Admitting: General Surgery

## 2021-01-26 ENCOUNTER — Other Ambulatory Visit: Payer: Self-pay | Admitting: General Surgery

## 2021-01-26 DIAGNOSIS — D0512 Intraductal carcinoma in situ of left breast: Secondary | ICD-10-CM

## 2021-01-27 ENCOUNTER — Other Ambulatory Visit: Payer: Self-pay

## 2021-01-27 ENCOUNTER — Other Ambulatory Visit
Admission: RE | Admit: 2021-01-27 | Discharge: 2021-01-27 | Disposition: A | Discharge status: Home / Self Care | Payer: Medicare HMO | Source: Ambulatory Visit | Attending: General Surgery | Admitting: General Surgery

## 2021-01-27 ENCOUNTER — Ambulatory Visit: Payer: Self-pay | Admitting: General Surgery

## 2021-01-27 HISTORY — DX: Personal history of other diseases of the digestive system: Z87.19

## 2021-01-27 NOTE — Patient Instructions (Addendum)
Your procedure is scheduled on: Friday 02/04/21 Report to the Registration Desk on the 1st floor of the [facility]. To find out your arrival time, please call [phone] between 1PM - 3PM on: Thursday 02/03/21  REMEMBER: Instructions that are not followed completely may result in serious medical risk, up to and including death; or upon the discretion of your surgeon and anesthesiologist your surgery may need to be rescheduled.  Do not eat food after midnight the night before surgery.  No gum chewing, lozengers or hard candies.  You may however, drink water up to 2 hours before you are scheduled to arrive for your surgery. Do not drink anything within 2 hours of your scheduled arrival time.  TAKE THESE MEDICATIONS THE MORNING OF SURGERY WITH A SIP OF WATER: omeprazole (PRILOSEC) 20 MG capsule (take one the night before and one on the morning of surgery - helps to prevent nausea after surgery.)  Dr Bary Castilla state that you can take your aspirin up to the day prior to surgery.  One week prior to surgery: Stop Anti-inflammatories (NSAIDS) such as Advil, Aleve, Ibuprofen, Motrin, Naproxen, Naprosyn and Aspirin based products such as Excedrin, Goodys Powder, BC Powder. Stop taking your Cholecalciferol (VITAMIN D) 125 MCG (5000 UT) CAPS, magnesium oxide (MAG-OX) 400 MG tablet, Multiple Vitamins-Minerals (MULTIVITAMIN WITH MINERALS) tablet, vitamin B-12 (CYANOCOBALAMIN) 1000 MCG tablet, vitamin E 400 UNIT capsule and ANY OVER THE COUNTER supplements until after surgery.  You may however, continue to take Tylenol if needed for pain up until the day of surgery.  No Alcohol for 24 hours before or after surgery.  No Smoking including e-cigarettes for 24 hours prior to surgery.  No chewable tobacco products for at least 6 hours prior to surgery.  No nicotine patches on the day of surgery.  Do not use any "recreational" drugs for at least a week prior to your surgery.  Please be advised that the  combination of cocaine and anesthesia may have negative outcomes, up to and including death. If you test positive for cocaine, your surgery will be cancelled.  On the morning of surgery brush your teeth with toothpaste and water, you may rinse your mouth with mouthwash if you wish. Do not swallow any toothpaste or mouthwash.  Use anti-bacterial soap the night before and day of surgery.  Do not wear jewelry, make-up, hairpins, clips or nail polish.  Do not wear lotions, powders, or perfumes.   Do not shave body from the neck down 48 hours prior to surgery just in case you cut yourself which could leave a site for infection.  Also, freshly shaved skin may become irritated if using the CHG soap.  Contact lenses, hearing aids and dentures may not be worn into surgery.  Do not bring valuables to the hospital. [facility] is not responsible for any missing/lost belongings or valuables.   Notify your doctor if there is any change in your medical condition (cold, fever, infection).  Wear comfortable clothing (specific to your surgery type) to the hospital.  After surgery, you can help prevent lung complications by doing breathing exercises.  Take deep breaths and cough every 1-2 hours.   If you are being discharged the day of surgery, you will not be allowed to drive home. You will need a responsible adult (18 years or older) to drive you home and stay with you that night.   If you are taking public transportation, you will need to have a responsible adult (18 years or older) with  you. Please confirm with your physician that it is acceptable to use public transportation.   Please call the [facility] [place] [facility] at [phone] if you have any questions about these instructions.  Surgery Visitation Policy:  Patients undergoing a surgery or procedure may have one family member or support person with them as long as that person is not COVID-19 positive or experiencing its symptoms.   That person may remain in the waiting area during the procedure and may rotate out with other people.  Inpatient Visitation:    Visiting hours are 7 a.m. to 8 p.m. Up to two visitors ages 16+ are allowed at one time in a patient room. The visitors may rotate out with other people during the day. Visitors must check out when they leave, or other visitors will not be allowed. One designated support person may remain overnight. The visitor must pass COVID-19 screenings, use hand sanitizer when entering and exiting the patient's room and wear a mask at all times, including in the patient's room. Patients must also wear a mask when staff or their visitor are in the room. Masking is required regardless of vaccination status.

## 2021-02-03 MED ORDER — CEFAZOLIN SODIUM-DEXTROSE 2-4 GM/100ML-% IV SOLN: 2.0000 g | INTRAVENOUS | Status: DC

## 2021-02-03 MED ORDER — CHLORHEXIDINE GLUCONATE CLOTH 2 % EX PADS: 6.0000 | MEDICATED_PAD | Freq: Once | CUTANEOUS | Status: DC

## 2021-02-03 MED ORDER — ORAL CARE MOUTH RINSE: 15.0000 mL | Freq: Once | OROMUCOSAL | Status: AC

## 2021-02-03 MED ORDER — CHLORHEXIDINE GLUCONATE 0.12 % MT SOLN: 15.0000 mL | Freq: Once | OROMUCOSAL | Status: AC

## 2021-02-03 MED ORDER — SODIUM CHLORIDE 0.9 % IV SOLN: INTRAVENOUS | Status: DC

## 2021-02-04 ENCOUNTER — Other Ambulatory Visit: Payer: Self-pay

## 2021-02-04 ENCOUNTER — Ambulatory Visit: Payer: Medicare HMO | Admitting: Anesthesiology

## 2021-02-04 ENCOUNTER — Ambulatory Visit
Admission: RE | Admit: 2021-02-04 | Discharge: 2021-02-04 | Disposition: A | Discharge status: Home / Self Care | Payer: Medicare HMO | Source: Ambulatory Visit | Attending: General Surgery | Admitting: General Surgery

## 2021-02-04 ENCOUNTER — Ambulatory Visit
Admission: RE | Admit: 2021-02-04 | Discharge: 2021-02-04 | Disposition: A | Discharge status: Home / Self Care | Payer: Medicare HMO | Attending: General Surgery | Admitting: General Surgery

## 2021-02-04 ENCOUNTER — Encounter
Admission: RE | Disposition: A | Discharge status: Home / Self Care | Payer: Self-pay | Source: Home / Self Care | Attending: General Surgery

## 2021-02-04 ENCOUNTER — Encounter: Payer: Self-pay | Admitting: General Surgery

## 2021-02-04 DIAGNOSIS — N183 Chronic kidney disease, stage 3 unspecified: Secondary | ICD-10-CM | POA: Diagnosis not present

## 2021-02-04 DIAGNOSIS — I69398 Other sequelae of cerebral infarction: Secondary | ICD-10-CM | POA: Insufficient documentation

## 2021-02-04 DIAGNOSIS — E1122 Type 2 diabetes mellitus with diabetic chronic kidney disease: Secondary | ICD-10-CM | POA: Diagnosis not present

## 2021-02-04 DIAGNOSIS — Z794 Long term (current) use of insulin: Secondary | ICD-10-CM | POA: Diagnosis not present

## 2021-02-04 DIAGNOSIS — Z419 Encounter for procedure for purposes other than remedying health state, unspecified: Secondary | ICD-10-CM

## 2021-02-04 DIAGNOSIS — Z87891 Personal history of nicotine dependence: Secondary | ICD-10-CM | POA: Diagnosis not present

## 2021-02-04 DIAGNOSIS — D0512 Intraductal carcinoma in situ of left breast: Secondary | ICD-10-CM

## 2021-02-04 DIAGNOSIS — I129 Hypertensive chronic kidney disease with stage 1 through stage 4 chronic kidney disease, or unspecified chronic kidney disease: Secondary | ICD-10-CM | POA: Diagnosis not present

## 2021-02-04 DIAGNOSIS — K219 Gastro-esophageal reflux disease without esophagitis: Secondary | ICD-10-CM | POA: Insufficient documentation

## 2021-02-04 DIAGNOSIS — R928 Other abnormal and inconclusive findings on diagnostic imaging of breast: Secondary | ICD-10-CM | POA: Diagnosis not present

## 2021-02-04 DIAGNOSIS — R531 Weakness: Secondary | ICD-10-CM | POA: Insufficient documentation

## 2021-02-04 DIAGNOSIS — Z853 Personal history of malignant neoplasm of breast: Secondary | ICD-10-CM | POA: Diagnosis not present

## 2021-02-04 DIAGNOSIS — C50912 Malignant neoplasm of unspecified site of left female breast: Secondary | ICD-10-CM | POA: Diagnosis not present

## 2021-02-04 DIAGNOSIS — Z6841 Body Mass Index (BMI) 40.0 and over, adult: Secondary | ICD-10-CM | POA: Diagnosis not present

## 2021-02-04 DIAGNOSIS — E785 Hyperlipidemia, unspecified: Secondary | ICD-10-CM | POA: Diagnosis not present

## 2021-02-04 HISTORY — PX: PARTIAL MASTECTOMY WITH NEEDLE LOCALIZATION: SHX6008

## 2021-02-04 LAB — GLUCOSE, CAPILLARY
Glucose-Capillary: 141 mg/dL — ABNORMAL HIGH (ref 70–99)
Glucose-Capillary: 157 mg/dL — ABNORMAL HIGH (ref 70–99)

## 2021-02-04 SURGERY — PARTIAL MASTECTOMY WITH NEEDLE LOCALIZATION
Anesthesia: General | Site: Breast | Laterality: Left

## 2021-02-04 MED ORDER — PROPOFOL 10 MG/ML IV BOLUS
INTRAVENOUS | Status: DC | PRN
  Administered 2021-02-04: 100 mg via INTRAVENOUS
  Administered 2021-02-04: 50 mg via INTRAVENOUS

## 2021-02-04 MED ORDER — STERILE WATER FOR IRRIGATION IR SOLN
Status: DC | PRN
  Administered 2021-02-04: 500 mL

## 2021-02-04 MED ORDER — TRAMADOL HCL 50 MG PO TABS: 50.0000 mg | ORAL_TABLET | Freq: Four times a day (QID) | ORAL | 0 refills | Status: DC | PRN

## 2021-02-04 MED ORDER — ROCURONIUM BROMIDE 10 MG/ML (PF) SYRINGE
PREFILLED_SYRINGE | INTRAVENOUS | Status: AC
  Filled 2021-02-04: qty 10

## 2021-02-04 MED ORDER — DEXAMETHASONE SODIUM PHOSPHATE 10 MG/ML IJ SOLN
INTRAMUSCULAR | Status: AC
  Filled 2021-02-04: qty 1

## 2021-02-04 MED ORDER — ONDANSETRON HCL 4 MG/2ML IJ SOLN
INTRAMUSCULAR | Status: DC | PRN
  Administered 2021-02-04: 4 mg via INTRAVENOUS

## 2021-02-04 MED ORDER — ACETAMINOPHEN 10 MG/ML IV SOLN
INTRAVENOUS | Status: AC
  Filled 2021-02-04: qty 100

## 2021-02-04 MED ORDER — LIDOCAINE HCL (CARDIAC) PF 100 MG/5ML IV SOSY
PREFILLED_SYRINGE | INTRAVENOUS | Status: DC | PRN
  Administered 2021-02-04: 100 mg via INTRAVENOUS

## 2021-02-04 MED ORDER — MORPHINE SULFATE (PF) 4 MG/ML IV SOLN: 1.0000 mg | INTRAVENOUS | Status: DC | PRN

## 2021-02-04 MED ORDER — FENTANYL CITRATE (PF) 100 MCG/2ML IJ SOLN: 25.0000 ug | INTRAMUSCULAR | Status: DC | PRN

## 2021-02-04 MED ORDER — DEXAMETHASONE SODIUM PHOSPHATE 10 MG/ML IJ SOLN
INTRAMUSCULAR | Status: DC | PRN
  Administered 2021-02-04: 5 mg via INTRAVENOUS

## 2021-02-04 MED ORDER — PHENYLEPHRINE HCL (PRESSORS) 10 MG/ML IV SOLN
INTRAVENOUS | Status: DC | PRN
Start: 2021-02-04 — End: 2021-02-04
  Administered 2021-02-04 (×3): 100 ug via INTRAVENOUS

## 2021-02-04 MED ORDER — ROCURONIUM BROMIDE 100 MG/10ML IV SOLN
INTRAVENOUS | Status: DC | PRN
  Administered 2021-02-04: 20 mg via INTRAVENOUS

## 2021-02-04 MED ORDER — SUGAMMADEX SODIUM 200 MG/2ML IV SOLN
INTRAVENOUS | Status: DC | PRN
  Administered 2021-02-04: 200 mg via INTRAVENOUS

## 2021-02-04 MED ORDER — CEFAZOLIN SODIUM-DEXTROSE 2-4 GM/100ML-% IV SOLN
INTRAVENOUS | Status: AC
  Filled 2021-02-04: qty 100

## 2021-02-04 MED ORDER — ONDANSETRON HCL 4 MG/2ML IJ SOLN
INTRAMUSCULAR | Status: AC
  Filled 2021-02-04: qty 2

## 2021-02-04 MED ORDER — BUPIVACAINE-EPINEPHRINE (PF) 0.5% -1:200000 IJ SOLN
INTRAMUSCULAR | Status: AC
  Filled 2021-02-04: qty 30

## 2021-02-04 MED ORDER — BUPIVACAINE-EPINEPHRINE 0.5% -1:200000 IJ SOLN
INTRAMUSCULAR | Status: DC | PRN
  Administered 2021-02-04: 10 mL

## 2021-02-04 MED ORDER — PROPOFOL 10 MG/ML IV BOLUS
INTRAVENOUS | Status: AC
  Filled 2021-02-04: qty 20

## 2021-02-04 MED ORDER — KETAMINE HCL 50 MG/5ML IJ SOSY
PREFILLED_SYRINGE | INTRAMUSCULAR | Status: AC
  Filled 2021-02-04: qty 5

## 2021-02-04 MED ORDER — EPHEDRINE SULFATE 50 MG/ML IJ SOLN
INTRAMUSCULAR | Status: DC | PRN
  Administered 2021-02-04: 5 mg via INTRAVENOUS
  Administered 2021-02-04 (×2): 10 mg via INTRAVENOUS

## 2021-02-04 MED ORDER — ONDANSETRON HCL 4 MG/2ML IJ SOLN: 4.0000 mg | Freq: Once | INTRAMUSCULAR | Status: DC | PRN

## 2021-02-04 MED ORDER — SUCCINYLCHOLINE CHLORIDE 200 MG/10ML IV SOSY
PREFILLED_SYRINGE | INTRAVENOUS | Status: DC | PRN
  Administered 2021-02-04: 120 mg via INTRAVENOUS

## 2021-02-04 MED ORDER — CHLORHEXIDINE GLUCONATE 0.12 % MT SOLN
OROMUCOSAL | Status: AC
  Administered 2021-02-04: 15 mL via OROMUCOSAL
  Filled 2021-02-04: qty 15

## 2021-02-04 MED ORDER — MIDAZOLAM HCL 2 MG/2ML IJ SOLN
INTRAMUSCULAR | Status: AC
  Filled 2021-02-04: qty 2

## 2021-02-04 MED ORDER — LIDOCAINE HCL (PF) 2 % IJ SOLN
INTRAMUSCULAR | Status: AC
  Filled 2021-02-04: qty 5

## 2021-02-04 MED ORDER — FENTANYL CITRATE (PF) 100 MCG/2ML IJ SOLN
INTRAMUSCULAR | Status: AC
  Filled 2021-02-04: qty 2

## 2021-02-04 MED ORDER — ACETAMINOPHEN 10 MG/ML IV SOLN
INTRAVENOUS | Status: DC | PRN
  Administered 2021-02-04: 1000 mg via INTRAVENOUS

## 2021-02-04 SURGICAL SUPPLY — 43 items
ADH SKN CLS APL DERMABOND .7 (GAUZE/BANDAGES/DRESSINGS) ×2
APL PRP STRL LF DISP 70% ISPRP (MISCELLANEOUS) ×2
BINDER BREAST XXLRG (GAUZE/BANDAGES/DRESSINGS) ×3 IMPLANT
BLADE SURG 15 STRL LF DISP TIS (BLADE) ×2 IMPLANT
BLADE SURG 15 STRL SS (BLADE) ×3
CHLORAPREP W/TINT 26 (MISCELLANEOUS) ×3 IMPLANT
DERMABOND ADVANCED (GAUZE/BANDAGES/DRESSINGS) ×1
DERMABOND ADVANCED .7 DNX12 (GAUZE/BANDAGES/DRESSINGS) ×2 IMPLANT
DEVICE DUBIN SPECIMEN MAMMOGRA (MISCELLANEOUS) ×3 IMPLANT
DRAPE LAPAROTOMY 77X122 PED (DRAPES) ×3 IMPLANT
ELECT CAUTERY BLADE 6.4 (BLADE) ×3 IMPLANT
ELECT REM PT RETURN 9FT ADLT (ELECTROSURGICAL) ×3
ELECTRODE REM PT RTRN 9FT ADLT (ELECTROSURGICAL) ×2 IMPLANT
GAUZE 4X4 16PLY ~~LOC~~+RFID DBL (SPONGE) ×3 IMPLANT
GLOVE SURG ENC MOIS LTX SZ6.5 (GLOVE) ×3 IMPLANT
GLOVE SURG UNDER POLY LF SZ6.5 (GLOVE) ×3 IMPLANT
GOWN STRL REUS W/ TWL LRG LVL3 (GOWN DISPOSABLE) ×4 IMPLANT
GOWN STRL REUS W/TWL LRG LVL3 (GOWN DISPOSABLE) ×6
KIT MARKER MARGIN INK (KITS) ×3 IMPLANT
KIT TURNOVER KIT A (KITS) ×3 IMPLANT
LABEL OR SOLS (LABEL) IMPLANT
MANIFOLD NEPTUNE II (INSTRUMENTS) ×3 IMPLANT
MARGIN MAP 10MM (MISCELLANEOUS) ×3 IMPLANT
MARKER MARGIN CORRECT CLIP (MARKER) ×3 IMPLANT
NEEDLE HYPO 25X1 1.5 SAFETY (NEEDLE) ×3 IMPLANT
PACK BASIN MINOR ARMC (MISCELLANEOUS) ×3 IMPLANT
RETRACTOR RING XSMALL (MISCELLANEOUS) ×2 IMPLANT
RTRCTR WOUND ALEXIS 13CM XS SH (MISCELLANEOUS) ×3
SLEVE PROBE SENORX GAMMA FIND (MISCELLANEOUS) ×3 IMPLANT
SPONGE T-LAP 18X18 ~~LOC~~+RFID (SPONGE) ×3 IMPLANT
SUT ETHILON 3-0 FS-10 30 BLK (SUTURE) ×3
SUT MNCRL 4-0 (SUTURE) ×3
SUT MNCRL 4-0 27XMFL (SUTURE) ×2
SUT SILK 2 0 SH (SUTURE) IMPLANT
SUT VIC AB 2-0 SH 27 (SUTURE) ×3
SUT VIC AB 2-0 SH 27XBRD (SUTURE) ×2 IMPLANT
SUT VIC AB 3-0 SH 27 (SUTURE) ×3
SUT VIC AB 3-0 SH 27X BRD (SUTURE) ×2 IMPLANT
SUTURE EHLN 3-0 FS-10 30 BLK (SUTURE) ×2 IMPLANT
SUTURE MNCRL 4-0 27XMF (SUTURE) ×2 IMPLANT
SYR 10ML LL (SYRINGE) ×3 IMPLANT
WATER STERILE IRR 1000ML POUR (IV SOLUTION) ×3 IMPLANT
WATER STERILE IRR 500ML POUR (IV SOLUTION) ×3 IMPLANT

## 2021-02-04 NOTE — Op Note (Signed)
Preoperative diagnosis: Left breast ductal carcinoma in situ.  Postoperative diagnosis: Same.   Procedure: Left needle-localized partial mastectomy.                      Anesthesia: GETA  Surgeon: Dr. Windell Moment  Wound Classification: Clean  Indications: Patient is a 80 y.o. female with a nonpalpable left breast mass noted on mammography with core biopsy demonstrating ductal carcinoma in situ requires needle localized partial mastectomy for treatment.   Findings: 1. Specimen mammography shows marker and needle on specimen 2. Pathology call refers gross examination of margins was negative 3. No other palpable mass or lymph node identified.   Description of procedure: Preoperative needle localization was performed by radiology. Localization studies were reviewed. The patient was taken to the operating room and placed supine on the operating table, and after general anesthesia the left chest and axilla were prepped and draped in the usual sterile fashion. A time-out was completed verifying correct patient, procedure, site, positioning, and implant(s) and/or special equipment prior to beginning this procedure.  By comparing the localization studies and interrogation with Localizer device, the probable trajectory and location of the mass was visualized. A circumareolar skin incision was planned in such a way as to minimize the amount of dissection to reach the mass.  The skin incision was made. Flaps were raised and the location of the tag was confirmed with Localizer device confirmed. A 2-0 silk figure-of-eight stay suture was placed and used for retraction. Dissection was then taken down circumferentially, taking care to include the entire localizing tag and a wide margin of grossly normal tissue. The specimen and entire localizing tag were removed. The specimen was oriented and sent to radiology with the localization studies. Confirmation was received that the entire target lesion had been  resected. The wound was irrigated. Hemostasis was checked.   The breast wound was then approached for closure.  Eliminate dead space a tissue transfer technique was utilized.  The breast and pectoralis fascia was elevated off the underlying muscle and the serratus muscle circumferentially for a distance of about 6 centimeters (36 sq cm).  The fascial layer was then approximated with interrupted 2-0 Vicryl sutures.  The superficial layer of the breast parenchyma was then approximated in a similar fashion.  This was done in a radial direction.  The skin flaps were then elevated circumferentially to remove a ripple noted superiorly and medially. The skin was closed with 4-0 Monocryl. Dermabond was applied.  The patient tolerated the procedure well and was taken to the postanesthesia care unit in stable condition.    Specimen: Left Breast mass                      Complications: None  Estimated Blood Loss: 5 mL

## 2021-02-04 NOTE — Anesthesia Preprocedure Evaluation (Addendum)
Anesthesia Evaluation  Patient identified by MRN, date of birth, ID band Patient awake    Reviewed: Allergy & Precautions, NPO status , Patient's Chart, lab work & pertinent test results, reviewed documented beta blocker date and time   History of Anesthesia Complications (+) history of anesthetic complications (CVA with breast surgery presumbed to be from hypoperfusion)  Airway Mallampati: II  TM Distance: >3 FB Neck ROM: Limited    Dental  (+) Upper Dentures, Lower Dentures   Pulmonary shortness of breath, neg sleep apnea (resolved), neg COPD, Not current smoker, former smoker,    breath sounds clear to auscultation       Cardiovascular hypertension, Pt. on medications and Pt. on home beta blockers (-) Past MI and (-) CHF (-) dysrhythmias (-) Valvular Problems/Murmurs Rhythm:Regular Rate:Normal     Neuro/Psych neg Seizures "both knees hurt" but this sounds more arthritic than post-CVA weakness or numbness CVA (weak knees, ), Residual Symptoms    GI/Hepatic Neg liver ROS, hiatal hernia, GERD  Medicated and Controlled,  Endo/Other  diabetes, Type 2, Insulin DependentMorbid obesityS/p parathyroidectomy   Renal/GU Renal InsufficiencyRenal disease (stage IV)     Musculoskeletal   Abdominal (+) + obese,   Peds  Hematology   Anesthesia Other Findings Left breast DCIS  S/p right breast lumpectomy, triple negative, with mammosite tx. Chemo with held due to CVA after lumpectomy   MRI BRAIN WITHOUT CONTRAST performed on 12/01/2014 1. Acute RIGHT frontotemporal infarct 2. Second area of acute LEFT occipital infarct is noted 3. No complicating hemorrhage 4. No visible vascular occlusion  CT HEAD WITHOUT CONTRAST performed on 12/01/2014 1. No evidence of acute intracranial abnormality including hemorrhage, infarct, mass lesion, mass-effect, midline shift, or abnormal extra-axial fluid collection 2. No hydrocephalus or  pneumocephalus 3. Imaged paranasal sinuses and mastoid sinuses are clear 4. Negative exam   BILATERAL CAROTID DOPPLER performed on 12/01/2014 1. RIGHT CAROTID ARTERY:  ? No focal plaque is identified.  ? Velocities and waveforms are within normal limits.  ? There is no evidence of right carotid stenosis in the neck. 2. RIGHT VERTEBRAL ARTERY:  ? Antegrade flow with normal waveform and velocity. 3. LEFT CAROTID ARTERY:  ? No focal plaque is identified.  ? Velocities and waveforms are normal.  ? There is no evidence of left carotid stenosis in the neck. 4. LEFT VERTEBRAL ARTERY:  ? Antegrade flow with normal waveform and velocity.  ECHOCARDIOGRAM performed on 12/01/2014 1. LVEF 60% 2. Left ventricular systolic function normal 3. Left ventricular cavity size normal 4. Doppler parameters are consistent with abnormal left-ventricular relaxation (grade 1 diastolic dysfunction) 5. Mild mitral valve regurgitation 6. Right atrium mildly dilated 7. No ASD or PFO identified   Reproductive/Obstetrics                             Anesthesia Physical  Anesthesia Plan  ASA: III  Anesthesia Plan: General   Post-op Pain Management:    Induction: Intravenous  PONV Risk Score and Plan: 3 and Propofol infusion, TIVA, Treatment may vary due to age or medical condition, Dexamethasone and Ondansetron  Airway Management Planned: LMA  Additional Equipment: None  Intra-op Plan:   Post-operative Plan:   Informed Consent:   Plan Discussed with:   Anesthesia Plan Comments: (Discussed risks of anesthesia with patient, including PONV, sore throat, lip/dental damage. Rare risks discussed as well, such as cardiorespiratory and neurological sequelae. Patient understands. Patient informed about increased incidence of above  perioperative risk due to high BMI. Patient understands. )        Anesthesia Quick Evaluation

## 2021-02-04 NOTE — Anesthesia Postprocedure Evaluation (Signed)
Anesthesia Post Note  Patient: Alexis Greer  Procedure(s) Performed: PARTIAL MASTECTOMY WITH NEEDLE LOCALIZATION (Left: Breast)  Patient location during evaluation: PACU Anesthesia Type: General Level of consciousness: awake and alert Pain management: pain level controlled Vital Signs Assessment: post-procedure vital signs reviewed and stable Respiratory status: spontaneous breathing, nonlabored ventilation, respiratory function stable and patient connected to nasal cannula oxygen Cardiovascular status: blood pressure returned to baseline and stable Postop Assessment: no apparent nausea or vomiting Anesthetic complications: no   No notable events documented.   Last Vitals:  Vitals:   02/04/21 1200 02/04/21 1212  BP: (!) 113/92 131/67  Pulse: 77 86  Resp: 16 18  Temp: (!) 36.2 C (!) 36.2 C  SpO2: 99% 100%    Last Pain:  Vitals:   02/04/21 1212  TempSrc: Temporal  PainSc: 0-No pain                 Brett Canales [place]

## 2021-02-04 NOTE — H&P (Signed)
Subjective:   Patient ID: Alexis Greer is a 80 y.o. female who comes today for surgical management of left breast DCIS. She was previously evaluated by Dr. Bary Greer and was ask to perform this surgery today. Patient denies any changes on history or physical exam since she was seen at the office.   HPI  The following portions of the patient's history were reviewed and updated as appropriate.  She states she could not feel anything different, it was found on routine mammogram. She has a history of right breast cancer in 2016.  She is here with her daughter, Alexis Greer.  Review of Systems  Constitutional: Negative for chills and fever.  Respiratory: Negative for cough.   Chief Complaint  Patient presents with   Breast Problem    BP 134/62  Pulse 92  Temp 36.8 C (98.2 F)  Ht 157.5 cm ('5\' 2"' )  Wt (!) 107 kg (236 lb)  LMP (LMP Unknown)  SpO2 98%  BMI 43.16 kg/m   Past Medical History:  Diagnosis Date   Adrenal nodule (CMS-HCC) 10/2011  2.5 cm left, on CT   Allergic state   Anemia   Arthritis   Asthma without status asthmaticus, unspecified   B12 deficiency   Cataract cortical, senile   Complication of anesthesia   Dyspnea   GERD (gastroesophageal reflux disease)   History of gout   History of radiation therapy 2016   Hypercalcemia and hyperparathyroidism  Resolved, s/p right neck inferior parathyroid adenoma (surgeon Dr Alexis Greer, [facility]) on 07/14/2020   Hyperlipidemia   Hypertension   Pneumonia   Right breast cancer (CMS-HCC) 2016  s/p right breast lumpectomy and SLNBx, s/p radiation therapy   Stage 3 chronic kidney disease (CMS-HCC)   Stroke (CMS-HCC) 2016   Tubular adenoma of colon 06/13/2016   Type 2 diabetes mellitus (CMS-HCC)   Vitamin D deficiency    Past Surgical History:  Procedure Laterality Date   affirm breast biopsy Left 12/27/2020   CATARACT EXTRACTION Bilateral   CHOLECYSTECTOMY 2020   COLONOSCOPY 06/13/2016  Tubular adenoma of  colon/Repeat 84yr/MUS   COLONOSCOPY 12/03/2019  Tubular adenomas/PHx CP/No Repeat due to age/TKT   history of breast biopsy Right 11/17/2014   HYSTERECTOMY 1979   lower extremity intervention Left 11/09/2016  Dr. DLucky Greer  MASTECTOMY PARTIAL / LUMPECTOMY Right 12/01/2014  Breast Cancer   OOPHORECTOMY 2014   PARATHYROIDECTOMY 07/14/2020   TONSILLECTOMY    OB History  No obstetric history on file.  Obstetric Comments  Age at first period Age of first pregnancy     Social History   Socioeconomic History   Marital status: Single  Tobacco Use   Smoking status: Former Smoker  Types: Cigarettes  Quit date: 08/25/1993  Years since quitting: 27.3   Smokeless tobacco: Never Used  Substance and Sexual Activity   Alcohol use: No   Drug use: No   Sexual activity: Never  Social History Narrative  Prevnar: 2018    Allergies  Allergen Reactions   Oxycodone Other (See Comments)  It made the patient have shakes and jittery   Current Outpatient Medications  Medication Sig Dispense Refill   ACCU-CHEK AVIVA PLUS METER Misc Use to check blood sugars daily 1 kit 0   acetaminophen (TYLENOL) 650 MG ER tablet Take 1 tablet every 4-6 hours as needed for pain 30 tablet 0   allopurinoL (ZYLOPRIM) 100 MG tablet TAKE 1 TABLET TWICE DAILY 180 tablet 3   amLODIPine (NORVASC) 10 MG tablet TAKE 1 TABLET EVERY  DAY 90 tablet 3   aspirin 81 MG EC tablet Take 81 mg by mouth once daily.   atorvastatin (LIPITOR) 40 MG tablet TAKE 1 TABLET EVERY NIGHT 90 tablet 3   blood glucose diagnostic (ACCU-CHEK AVIVA PLUS TEST STRP) test strip 2 (two) times daily Use as instructed. 200 strip 3   cholecalciferol, vitamin D3, (VITAMIN D3) 125 mcg (5,000 unit) tablet 1 tablet daily - dose unknown   DROPLET PEN NEEDLE 31 gauge x 3/16" needle USE TWICE DAILY 200 each 2   ezetimibe (ZETIA) 10 mg tablet TAKE 1 TABLET EVERY DAY 90 tablet 1   insulin NPH-REGULAR (HUMULIN 70/30) 100 unit/mL (70-30) injection Inject 20 units  with breakfast and 36 units before dinner. If sugar is under 70 only take half dose. (Patient taking differently: Inject 20 units with breakfast and 38 units before dinner. If sugar is under 70 only take half dose.) 10 mL 0   losartan (COZAAR) 25 MG tablet TAKE 1 TABLET EVERY DAY 90 tablet 3   magnesium oxide (MAG-OX) 400 mg (241.3 mg magnesium) tablet TAKE 1 TABLET TWICE DAILY 180 tablet 1   metoprolol succinate (TOPROL-XL) 25 MG XL tablet Take 1 tablet (25 mg total) by mouth once daily 90 tablet 3   multivitamin tablet Take 1 tablet by mouth once daily.   omeprazole (PRILOSEC) 20 MG DR capsule TAKE 1 CAPSULE TWICE DAILY 180 capsule 3   pen needle, diabetic (BD ULTRA-FINE MINI PEN NEEDLE) 31 gauge x 3/16" needle 2 (two) times daily 200 each 1   VITAMIN B-12 1,000 mcg SL tablet Place 1,000 mcg under the tongue once daily   vitamin E 400 UNIT capsule Take 1 capsule by mouth once daily   No current facility-administered medications for this visit.   Family History  Problem Relation Age of Onset   Heart disease Mother   High blood pressure (Hypertension) Mother   Hyperlipidemia (Elevated cholesterol) Father   Coronary Artery Disease (Blocked arteries around heart) Father   Lung cancer Sister   Diabetes type II Brother   Colon cancer Brother   Thyroid disease Daughter   Breast cancer Neg Hx    Objective:  Physical Exam Exam conducted with a chaperone present.  Constitutional:  Appearance: Normal appearance.  Cardiovascular:  Rate and Rhythm: Normal rate. Rhythm irregular.  Pulses: Normal pulses.  Heart sounds: Normal heart sounds.  Pulmonary:  Effort: Pulmonary effort is normal.  Breath sounds: Normal breath sounds.  Chest:  Breasts:  Right: Normal.  Left: Normal.   Comments: Firm area superior to the upper inner quadrant right excision site on the right consistent with prior MammoSite treatment. Abdominal:   Comments: Incision above the level of the umbilicus with a 8 cm  mass of nonreducible omentum. Possibly related to cholecystectomy in 2020.  Musculoskeletal:  Cervical back: Neck supple.  Lymphadenopathy:  Upper Body:  Right upper body: No supraclavicular or axillary adenopathy.  Left upper body: No supraclavicular or axillary adenopathy.  Skin: General: Skin is warm and dry.  Neurological:  Mental Status: She is alert and oriented to person, place, and time.  Psychiatric:  Mood and Affect: Mood normal.  Behavior: Behavior normal.   Labs and Radiology:   December 27, 2020 pathology:  DIAGNOSIS:  A. BREAST ASYMMETRY WITH CALCIFICATIONS, LEFT UPPER OUTER; STEREOTACTIC  BIOPSY:  - HIGH-GRADE DUCTAL CARCINOMA IN SITU (DCIS), COMEDO TYPE WITH  ASSOCIATED CALCIFICATIONS.   Comment:  DCIS is present in 3 of 5 blocks. IHC for estrogen receptor is  deferred  to an excision specimen.  This area measures about 8 mm in maximum diameter. Some calcifications are evident.  Mammogram review:  Review of the September 14 through December 27, 2020 mammograms was completed. Comparison was made to the December 05, 2019 and January 19, 2019 studies. Developing density in the central portion of the left breast.  Postbiopsy mammogram show no residual density in the area of original biopsy.  Ultrasound:  Limited ultrasound was completed to determine if preoperative wire localization would be required. After only 3 days there is no residual distortion in the area and wire localization will be required.  Laboratory review:  Aug 06, 2020:  Component Ref Range & Units 4 mo ago  WBC (White Blood Cell Count) 4.1 - 10.2 10^3/uL 6.1  RBC (Red Blood Cell Count) 4.04 - 5.48 10^6/uL 3.93 Low   Hemoglobin 12.0 - 15.0 gm/dL 11.7 Low   Hematocrit 35.0 - 47.0 % 36.6  MCV (Mean Corpuscular Volume) 80.0 - 100.0 fl 93.1  MCH (Mean Corpuscular Hemoglobin) 27.0 - 31.2 pg 29.8  MCHC (Mean Corpuscular Hemoglobin Concentration) 32.0 - 36.0 gm/dL 32.0  Platelet Count 150 - 450  10^3/uL 283  RDW-CV (Red Cell Distribution Width) 11.6 - 14.8 % 14.8  MPV (Mean Platelet Volume) 9.4 - 12.4 fl 9.2 Low   Neutrophils 1.50 - 7.80 10^3/uL 2.93  Lymphocytes 1.00 - 3.60 10^3/uL 2.14  Monocytes 0.00 - 1.50 10^3/uL 0.77  Eosinophils 0.00 - 0.55 10^3/uL 0.16  Basophils 0.00 - 0.09 10^3/uL 0.05  Neutrophil % 32.0 - 70.0 % 48.4  Lymphocyte % 10.0 - 50.0 % 35.3  Monocyte % 4.0 - 13.0 % 12.7  Eosinophil % 1.0 - 5.0 % 2.6  Basophil% 0.0 - 2.0 % 0.8  Immature Granulocyte % <=0.7 % 0.2  Immature Granulocyte Count <=0.06 10^3/L 0.01   November 09, 2020:  Glucose 70 - 110 mg/dL 175 High   Sodium 136 - 145 mmol/L 140  Potassium 3.6 - 5.1 mmol/L 5.0  Chloride 97 - 109 mmol/L 108  Carbon Dioxide (CO2) 22.0 - 32.0 mmol/L 25.5  Calcium 8.7 - 10.3 mg/dL 9.3  Urea Nitrogen (BUN) 7 - 25 mg/dL 33 High   Creatinine 0.6 - 1.1 mg/dL 2.0 High   Glomerular Filtration Rate (eGFR), MDRD Estimate >60 mL/min/1.73sq m 29 Low   BUN/Crea Ratio 6.0 - 20.0 16.5  Anion Gap w/K 6.0 - 16.0 11.5   Prior to parathyroidectomy calcium was running between 10.5 and 10.8.  PTH of Aug 06, 2020 remains elevated at 160.  Hemoglobin A1C 4.2 - 5.6 % 7.9 High    Average Blood Glucose (Calc) mg/dL 180   The patient reports that her morning blood sugars typically run between 130 and 150.   Assessment:   High-grade DCIS of the left breast.    Plan:   Needle localized partial mastectomy

## 2021-02-04 NOTE — Discharge Instructions (Addendum)

## 2021-02-04 NOTE — Anesthesia Procedure Notes (Signed)
Procedure Name: Intubation Date/Time: 02/04/2021 10:27 AM Performed by: Zetta Bills, CRNA Pre-anesthesia Checklist: Patient identified, Emergency Drugs available, Suction available, Patient being monitored and Timeout performed Patient Re-evaluated:Patient Re-evaluated prior to induction Oxygen Delivery Method: Circle system utilized Preoxygenation: Pre-oxygenation with 100% oxygen Induction Type: IV induction Ventilation: Mask ventilation without difficulty Laryngoscope Size: McGraph and 3 Grade View: Grade I Tube type: Oral Tube size: 7.0 mm Number of attempts: 2 Airway Equipment and Method: Stylet Placement Confirmation: ETT inserted through vocal cords under direct vision Secured at: 21 cm Tube secured with: Tape Dental Injury: Teeth and Oropharynx as per pre-operative assessment  Comments: Failed attempt to seat LMA with edentulous oral cavity- precede to intubate without issue

## 2021-02-04 NOTE — Transfer of Care (Signed)
Immediate Anesthesia Transfer of Care Note  Patient: Alexis Greer  Procedure(s) Performed: PARTIAL MASTECTOMY WITH NEEDLE LOCALIZATION (Left: Breast)  Patient Location: PACU  Anesthesia Type:General  Level of Consciousness: drowsy  Airway & Oxygen Therapy: Patient Spontanous Breathing  Post-op Assessment: Report given to RN and Post -op Vital signs reviewed and stable  Post vital signs: Reviewed and stable  Last Vitals:  Vitals Value Taken Time  BP 114/57 02/04/21 1135  Temp    Pulse 80 02/04/21 1138  Resp 23 02/04/21 1138  SpO2 96 % 02/04/21 1138  Vitals shown include unvalidated device data.  Last Pain:  Vitals:   02/04/21 0901  TempSrc: Oral  PainSc: 0-No pain         Complications: No notable events documented.

## 2021-02-05 ENCOUNTER — Encounter: Payer: Self-pay | Admitting: General Surgery

## 2021-02-08 ENCOUNTER — Other Ambulatory Visit: Payer: Self-pay | Admitting: Anatomic Pathology & Clinical Pathology

## 2021-02-09 LAB — SURGICAL PATHOLOGY

## 2021-02-15 DIAGNOSIS — D0512 Intraductal carcinoma in situ of left breast: Secondary | ICD-10-CM | POA: Diagnosis not present

## 2021-02-21 ENCOUNTER — Other Ambulatory Visit: Payer: Self-pay

## 2021-02-21 ENCOUNTER — Encounter: Payer: Self-pay | Admitting: Radiation Oncology

## 2021-02-21 ENCOUNTER — Ambulatory Visit
Admission: RE | Admit: 2021-02-21 | Discharge: 2021-02-21 | Disposition: A | Discharge status: Home / Self Care | Payer: Medicare HMO | Source: Ambulatory Visit | Attending: Radiation Oncology | Admitting: Radiation Oncology

## 2021-02-21 VITALS — BP 160/64 | HR 82 | Temp 97.2°F | Resp 16 | Wt 236.0 lb

## 2021-02-21 DIAGNOSIS — Z923 Personal history of irradiation: Secondary | ICD-10-CM | POA: Insufficient documentation

## 2021-02-21 DIAGNOSIS — M129 Arthropathy, unspecified: Secondary | ICD-10-CM | POA: Insufficient documentation

## 2021-02-21 DIAGNOSIS — E559 Vitamin D deficiency, unspecified: Secondary | ICD-10-CM | POA: Insufficient documentation

## 2021-02-21 DIAGNOSIS — K219 Gastro-esophageal reflux disease without esophagitis: Secondary | ICD-10-CM | POA: Insufficient documentation

## 2021-02-21 DIAGNOSIS — Z8673 Personal history of transient ischemic attack (TIA), and cerebral infarction without residual deficits: Secondary | ICD-10-CM | POA: Diagnosis not present

## 2021-02-21 DIAGNOSIS — E213 Hyperparathyroidism, unspecified: Secondary | ICD-10-CM | POA: Insufficient documentation

## 2021-02-21 DIAGNOSIS — E538 Deficiency of other specified B group vitamins: Secondary | ICD-10-CM | POA: Insufficient documentation

## 2021-02-21 DIAGNOSIS — N184 Chronic kidney disease, stage 4 (severe): Secondary | ICD-10-CM | POA: Insufficient documentation

## 2021-02-21 DIAGNOSIS — I129 Hypertensive chronic kidney disease with stage 1 through stage 4 chronic kidney disease, or unspecified chronic kidney disease: Secondary | ICD-10-CM | POA: Diagnosis not present

## 2021-02-21 DIAGNOSIS — D0512 Intraductal carcinoma in situ of left breast: Secondary | ICD-10-CM | POA: Diagnosis not present

## 2021-02-21 DIAGNOSIS — Z853 Personal history of malignant neoplasm of breast: Secondary | ICD-10-CM | POA: Insufficient documentation

## 2021-02-21 DIAGNOSIS — E785 Hyperlipidemia, unspecified: Secondary | ICD-10-CM | POA: Insufficient documentation

## 2021-02-21 DIAGNOSIS — E119 Type 2 diabetes mellitus without complications: Secondary | ICD-10-CM | POA: Diagnosis not present

## 2021-02-21 DIAGNOSIS — Z171 Estrogen receptor negative status [ER-]: Secondary | ICD-10-CM | POA: Diagnosis not present

## 2021-02-21 NOTE — Consult Note (Signed)
NEW PATIENT EVALUATION  Name: Alexis Greer  MRN: [id number]  Date:   02/21/2021     DOB: May 13, 1940   This 80 y.o. female patient presents to the clinic for initial evaluation of DCIS of the left breast in patient previously treated with accelerated partial breast radiation to her right breast for stage I invasive mammary carcinoma.  REFERRING PHYSICIAN: Juluis Pitch, MD  CHIEF COMPLAINT:  Chief Complaint  Patient presents with   Breast Cancer    OPNA, left breast     DIAGNOSIS: The primary encounter diagnosis was Ductal carcinoma in situ (DCIS) of left breast. A diagnosis of Malignant neoplasm of upper-outer quadrant of right breast in female, estrogen receptor negative ([place]) was also pertinent to this visit.   PREVIOUS INVESTIGATIONS:  Pathology report reviewed Clinical notes reviewed Mammogram and ultrasound reviewed  HPI: Patient is a 80 year old female well-known to our department having been treated back in 2016 for stage I invasive mammary carcinoma the right breast status post accelerated partial breast irradiation.  She has done well from breast standpoint without complaints.  Recent mammograms showed left upper outer breast quadrant with a group of indeterminate calcifications.  She underwent stereotactic core biopsy showing ductal carcinoma in situ high-grade with comedonecrosis and associated calcifications.  ER/PR status is pending.  She has been seen by Dr. Tollie Pizza who performed ultrasound showing good cavity suitable for MammoSite balloon catheter placement.  She is seen today for evaluation as she is doing well she specifically denies breast tenderness cough or bone pain.  PLANNED TREATMENT REGIMEN: Accelerated partial breast radiation of the left breast  PAST MEDICAL HISTORY:  has a past medical history of Anemia, Arthritis, Asthma, B12 deficiency, Breast cancer ([place]) (11/17/2014), CKD (chronic kidney disease), stage IV ([place]), Complication of anesthesia,  Diabetes mellitus without complication ([place]), Dyspnea, GERD (gastroesophageal reflux disease), Gout, History of hiatal hernia, Hyperlipidemia, Hyperparathyroidism ([place]), Hypertension, Malignant neoplasm ([place]), Morbid obesity ([place]), Personal history of radiation therapy (2016), Pneumonia, Stroke ([place]) (2016), and Vitamin D deficiency.    PAST SURGICAL HISTORY:  Past Surgical History:  Procedure Laterality Date   ABDOMINAL HYSTERECTOMY  1979   BREAST BIOPSY Right 11/17/2014   Invasive mammory carcinoma   BREAST BIOPSY Right 12/01/2014   Procedure: BREAST BIOPSY WITH NEEDLE LOCALIZATION;  Surgeon: Christene Lye, MD;  Location: [facility] ORS;  Service: General;  Laterality: Right;   BREAST BIOPSY Left 12/27/2020   Affirm bx-asymmetry/calcs "Ribbon" clip-path pending   BREAST LUMPECTOMY Right 2016   INVASIVE MAMMARY CARCINOMA    BREAST LUMPECTOMY WITH SENTINEL LYMPH NODE BIOPSY Right 12/01/2014   Procedure: BREAST LUMPECTOMY WITH SENTINEL LYMPH NODE BX;  Surgeon: Christene Lye, MD;  Location: [facility] ORS;  Service: General;  Laterality: Right;   CHOLECYSTECTOMY  2020   COLONOSCOPY WITH PROPOFOL N/A 06/13/2016   Procedure: COLONOSCOPY WITH PROPOFOL;  Surgeon: Lollie Sails, MD;  Location: [facility] ENDOSCOPY;  Service: Endoscopy;  Laterality: N/A;   COLONOSCOPY WITH PROPOFOL N/A 12/03/2019   Procedure: COLONOSCOPY WITH PROPOFOL;  Surgeon: Toledo, Benay Pike, MD;  Location: [facility] ENDOSCOPY;  Service: Gastroenterology;  Laterality: N/A;   EYE SURGERY Bilateral    CATARACT REMOVAL   LOWER EXTREMITY INTERVENTION Left 11/09/2016   Procedure: LOWER EXTREMITY INTERVENTION;  Surgeon: Algernon Huxley, MD;  Location: [place] [facility];  Service: Cardiovascular;  Laterality: Left;   MASTECTOMY Right    lumpectomy    OOPHORECTOMY  2014   PARATHYROIDECTOMY N/A 07/14/2020   Procedure: PARATHYROIDECTOMY, RNFA to assist;  Surgeon:  Fredirick Maudlin, MD;  Location: [facility] ORS;  Service: General;  Laterality:  N/A;   PARTIAL MASTECTOMY WITH NEEDLE LOCALIZATION Left 02/04/2021   Procedure: PARTIAL MASTECTOMY WITH NEEDLE LOCALIZATION;  Surgeon: Herbert Pun, MD;  Location: [facility] ORS;  Service: General;  Laterality: Left;   TONSILLECTOMY      FAMILY HISTORY: family history includes CAD in her father; Heart disease in her mother; Hyperlipidemia in her father; Lung cancer in her sister; Thyroid disease in her daughter and daughter.  SOCIAL HISTORY:  reports that she quit smoking about 27 years ago. Her smoking use included cigarettes. She has never used smokeless tobacco. She reports that she does not drink alcohol and does not use drugs.  ALLERGIES: Oxycodone  MEDICATIONS:  Current Outpatient Medications  Medication Sig Dispense Refill   ACCU-CHEK AVIVA PLUS test strip      acetaminophen (TYLENOL) 325 MG tablet Take 650 mg by mouth every 6 (six) hours as needed for moderate pain or headache.     allopurinol (ZYLOPRIM) 100 MG tablet Take 100 mg by mouth 2 (two) times daily.      amLODipine (NORVASC) 10 MG tablet Take 10 mg by mouth daily.     aspirin EC 81 MG tablet Take 81 mg by mouth daily.     atorvastatin (LIPITOR) 40 MG tablet Take 40 mg by mouth daily at 6 PM.     Cholecalciferol (VITAMIN D) 125 MCG (5000 UT) CAPS Take 5,000 Units by mouth daily.     diphenhydrAMINE (BENADRYL) 25 MG tablet Take 50 mg by mouth every 6 (six) hours as needed for allergies.     DROPLET PEN NEEDLES 31G X 5 MM MISC      ezetimibe (ZETIA) 10 MG tablet Take 10 mg by mouth daily.     Insulin Isophane & Regular Human (NOVOLIN 70/30 FLEXPEN) (70-30) 100 UNIT/ML PEN Inject 5 Units into the skin 2 (two) times daily. (Patient taking differently: Inject 20-40 Units into the skin See admin instructions. Inject 20 units in the morning with breakfast and 40 units in the evening with dinner (half the dose if blood sugar is under 70)) 15 mL 11   losartan (COZAAR) 25 MG tablet Take 25 mg by mouth daily.      magnesium oxide  (MAG-OX) 400 MG tablet Take 800 mg by mouth 2 (two) times daily.     metoprolol succinate (TOPROL-XL) 25 MG 24 hr tablet Take 25 mg by mouth at bedtime.     Multiple Vitamins-Minerals (MULTIVITAMIN WITH MINERALS) tablet Take 1 tablet by mouth daily.     omeprazole (PRILOSEC) 20 MG capsule Take 20 mg by mouth 2 (two) times daily.     vitamin B-12 (CYANOCOBALAMIN) 1000 MCG tablet Take 1,000 mcg by mouth daily.     vitamin E 400 UNIT capsule Take 400 Units by mouth daily.      No current facility-administered medications for this encounter.    ECOG PERFORMANCE STATUS:  0 - Asymptomatic  REVIEW OF SYSTEMS: Patient denies any weight loss, fatigue, weakness, fever, chills or night sweats. Patient denies any loss of vision, blurred vision. Patient denies any ringing  of the ears or hearing loss. No irregular heartbeat. Patient denies heart murmur or history of fainting. Patient denies any chest pain or pain radiating to her upper extremities. Patient denies any shortness of breath, difficulty breathing at night, cough or hemoptysis. Patient denies any swelling in the lower legs. Patient denies any nausea vomiting, vomiting of blood, or coffee ground material  in the vomitus. Patient denies any stomach pain. Patient states has had normal bowel movements no significant constipation or diarrhea. Patient denies any dysuria, hematuria or significant nocturia. Patient denies any problems walking, swelling in the joints or loss of balance. Patient denies any skin changes, loss of hair or loss of weight. Patient denies any excessive worrying or anxiety or significant depression. Patient denies any problems with insomnia. Patient denies excessive thirst, polyuria, polydipsia. Patient denies any swollen glands, patient denies easy bruising or easy bleeding. Patient denies any recent infections, allergies or URI. Patient "s visual fields have not changed significantly in recent time.   PHYSICAL EXAM: BP (!) 160/64 (BP  Location: Right Arm, Patient Position: Sitting)   Pulse 82   Temp (!) 97.2 F (36.2 C) (Tympanic)   Resp 16   Wt 236 lb (107 kg)   BMI 43.16 kg/m  Left breast is wide local excision site which is well-healed no dominant masses noted in either breast except for some scarring in the region of the previous MammoSite of the right breast.  No axillary or supraclavicular adenopathy is appreciated.  Well-developed well-nourished patient in NAD. HEENT reveals PERLA, EOMI, discs not visualized.  Oral cavity is clear. No oral mucosal lesions are identified. Neck is clear without evidence of cervical or supraclavicular adenopathy. Lungs are clear to A&P. Cardiac examination is essentially unremarkable with regular rate and rhythm without murmur rub or thrill. Abdomen is benign with no organomegaly or masses noted. Motor sensory and DTR levels are equal and symmetric in the upper and lower extremities. Cranial nerves II through XII are grossly intact. Proprioception is intact. No peripheral adenopathy or edema is identified. No motor or sensory levels are noted. Crude visual fields are within normal range.  LABORATORY DATA: Pathology report reviewed    RADIOLOGY RESULTS: Mammogram and ultrasound reviewed compatible with above-stated findings   IMPRESSION: Stage 0 (Tis N0 M0) high-grade ductal carcinoma in situ of the left breast status post wide local excision and patient previously treated to her right breast for stage I invasive mammary carcinoma with accelerated partial breast radiation.  PLAN: At this time elect to go ahead with accelerated partial breast radiation if MammoSite catheter can be placed to her left breast.  I would then plan on 34 Gray in 10 fractions at 3.4 Gray per fraction twice daily using high-dose-rate remote afterloading.  Risks and benefits of treatment including permanent scarring of the lumpectomy site possible fatigue possible small area of skin reaction all were reviewed in detail  with the patient.  She seems to comprehend my treatment plan well.  We will coordinate with Dr. Tollie Pizza placement of the MammoSite catheter and her simulation.  Patient comprehends my recommendations well.  Depending on her ER/PR status determination with medical oncology will be made for possible aromatase inhibitor therapy.  I would like to take this opportunity to thank you for allowing me to participate in the care of your patient.Noreene Filbert, MD

## 2021-03-08 DIAGNOSIS — D0512 Intraductal carcinoma in situ of left breast: Secondary | ICD-10-CM | POA: Diagnosis not present

## 2021-03-10 ENCOUNTER — Ambulatory Visit
Admission: RE | Admit: 2021-03-10 | Discharge: 2021-03-10 | Disposition: A | Discharge status: Home / Self Care | Payer: Medicare HMO | Source: Ambulatory Visit | Attending: Radiation Oncology | Admitting: Radiation Oncology

## 2021-03-10 DIAGNOSIS — D0512 Intraductal carcinoma in situ of left breast: Secondary | ICD-10-CM | POA: Diagnosis not present

## 2021-03-11 ENCOUNTER — Ambulatory Visit
Admission: RE | Admit: 2021-03-11 | Discharge: 2021-03-11 | Disposition: A | Discharge status: Home / Self Care | Payer: Medicare HMO | Source: Ambulatory Visit | Attending: Radiation Oncology | Admitting: Radiation Oncology

## 2021-03-11 DIAGNOSIS — D0512 Intraductal carcinoma in situ of left breast: Secondary | ICD-10-CM | POA: Diagnosis not present

## 2021-03-14 ENCOUNTER — Ambulatory Visit
Admission: RE | Admit: 2021-03-14 | Discharge: 2021-03-14 | Disposition: A | Discharge status: Home / Self Care | Payer: Medicare HMO | Source: Ambulatory Visit | Attending: Radiation Oncology | Admitting: Radiation Oncology

## 2021-03-14 DIAGNOSIS — D0512 Intraductal carcinoma in situ of left breast: Secondary | ICD-10-CM | POA: Diagnosis not present

## 2021-03-15 ENCOUNTER — Ambulatory Visit
Admission: RE | Admit: 2021-03-15 | Discharge: 2021-03-15 | Disposition: A | Discharge status: Home / Self Care | Payer: Medicare HMO | Source: Ambulatory Visit | Attending: Radiation Oncology | Admitting: Radiation Oncology

## 2021-03-15 DIAGNOSIS — Z794 Long term (current) use of insulin: Secondary | ICD-10-CM | POA: Diagnosis not present

## 2021-03-15 DIAGNOSIS — N184 Chronic kidney disease, stage 4 (severe): Secondary | ICD-10-CM | POA: Diagnosis not present

## 2021-03-15 DIAGNOSIS — E1122 Type 2 diabetes mellitus with diabetic chronic kidney disease: Secondary | ICD-10-CM | POA: Diagnosis not present

## 2021-03-15 DIAGNOSIS — D0512 Intraductal carcinoma in situ of left breast: Secondary | ICD-10-CM | POA: Diagnosis not present

## 2021-03-16 ENCOUNTER — Ambulatory Visit
Admission: RE | Admit: 2021-03-16 | Discharge: 2021-03-16 | Disposition: A | Discharge status: Home / Self Care | Payer: Medicare HMO | Source: Ambulatory Visit | Attending: Radiation Oncology | Admitting: Radiation Oncology

## 2021-03-16 DIAGNOSIS — D0512 Intraductal carcinoma in situ of left breast: Secondary | ICD-10-CM | POA: Diagnosis not present

## 2021-03-17 ENCOUNTER — Ambulatory Visit
Admission: RE | Admit: 2021-03-17 | Discharge: 2021-03-17 | Disposition: A | Discharge status: Home / Self Care | Payer: Medicare HMO | Source: Ambulatory Visit | Attending: Radiation Oncology | Admitting: Radiation Oncology

## 2021-03-17 DIAGNOSIS — D0512 Intraductal carcinoma in situ of left breast: Secondary | ICD-10-CM | POA: Diagnosis not present

## 2021-03-22 DIAGNOSIS — N184 Chronic kidney disease, stage 4 (severe): Secondary | ICD-10-CM | POA: Diagnosis not present

## 2021-03-22 DIAGNOSIS — E1129 Type 2 diabetes mellitus with other diabetic kidney complication: Secondary | ICD-10-CM | POA: Diagnosis not present

## 2021-03-22 DIAGNOSIS — E042 Nontoxic multinodular goiter: Secondary | ICD-10-CM | POA: Diagnosis not present

## 2021-03-22 DIAGNOSIS — E1122 Type 2 diabetes mellitus with diabetic chronic kidney disease: Secondary | ICD-10-CM | POA: Diagnosis not present

## 2021-03-22 DIAGNOSIS — N2581 Secondary hyperparathyroidism of renal origin: Secondary | ICD-10-CM | POA: Diagnosis not present

## 2021-03-22 DIAGNOSIS — R809 Proteinuria, unspecified: Secondary | ICD-10-CM | POA: Diagnosis not present

## 2021-03-22 DIAGNOSIS — I10 Essential (primary) hypertension: Secondary | ICD-10-CM | POA: Diagnosis not present

## 2021-03-22 DIAGNOSIS — Z794 Long term (current) use of insulin: Secondary | ICD-10-CM | POA: Diagnosis not present

## 2021-04-27 ENCOUNTER — Other Ambulatory Visit: Payer: Self-pay

## 2021-04-27 ENCOUNTER — Encounter: Payer: Self-pay | Admitting: Radiation Oncology

## 2021-04-27 ENCOUNTER — Ambulatory Visit
Admission: RE | Admit: 2021-04-27 | Discharge: 2021-04-27 | Disposition: A | Discharge status: Home / Self Care | Payer: Medicare HMO | Source: Ambulatory Visit | Attending: Radiation Oncology | Admitting: Radiation Oncology

## 2021-04-27 ENCOUNTER — Other Ambulatory Visit: Payer: Self-pay | Admitting: *Deleted

## 2021-04-27 DIAGNOSIS — C50411 Malignant neoplasm of upper-outer quadrant of right female breast: Secondary | ICD-10-CM

## 2021-04-27 DIAGNOSIS — Z923 Personal history of irradiation: Secondary | ICD-10-CM | POA: Insufficient documentation

## 2021-04-27 DIAGNOSIS — D0512 Intraductal carcinoma in situ of left breast: Secondary | ICD-10-CM | POA: Diagnosis not present

## 2021-04-27 DIAGNOSIS — Z170 Estrogen receptor positive status [ER+]: Secondary | ICD-10-CM | POA: Insufficient documentation

## 2021-04-27 DIAGNOSIS — Z171 Estrogen receptor negative status [ER-]: Secondary | ICD-10-CM

## 2021-04-27 DIAGNOSIS — Z79811 Long term (current) use of aromatase inhibitors: Secondary | ICD-10-CM | POA: Diagnosis not present

## 2021-04-27 NOTE — Progress Notes (Signed)
Radiation Oncology Follow up Note  Name: GIOVANNI BATH   Date:   04/27/2021 MRN:  [id number] DOB: [birth year]    This 81 y.o. female presents to the clinic today for 1 month follow-up status post accelerated partial breast radiation with high-dose-rate remote afterloading to the right breast for stage I invasive mammary carcinoma.  REFERRING PROVIDER: Juluis Pitch, MD  HPI: Patient is a 81 year old female now at 1 month having completed accelerated partial breast radiation with high-dose-rate remote afterloading for a ductal carcinoma in situ of the left breast status post wide local excision seen today in routine follow-up she is doing well.  She specifically denies breast tenderness cough or bone pain.  She has not yet been started on antiestrogen therapy..  COMPLICATIONS OF TREATMENT: none  FOLLOW UP COMPLIANCE: keeps appointments   PHYSICAL EXAM:  BP (!) (P) 154/76 (BP Location: Right Arm)    Pulse (P) 99    Temp (!) (P) 96.6 F (35.9 C) (Tympanic)    Resp (P) 20    Wt (P) 235 lb 14.4 oz (107 kg)    BMI (P) 43.15 kg/m  Lungs are clear to A&P cardiac examination essentially unremarkable with regular rate and rhythm. No dominant mass or nodularity is noted in either breast in 2 positions examined. Incision is well-healed. No axillary or supraclavicular adenopathy is appreciated. Cosmetic result is excellent.  Well-developed well-nourished patient in NAD. HEENT reveals PERLA, EOMI, discs not visualized.  Oral cavity is clear. No oral mucosal lesions are identified. Neck is clear without evidence of cervical or supraclavicular adenopathy. Lungs are clear to A&P. Cardiac examination is essentially unremarkable with regular rate and rhythm without murmur rub or thrill. Abdomen is benign with no organomegaly or masses noted. Motor sensory and DTR levels are equal and symmetric in the upper and lower extremities. Cranial nerves II through XII are grossly intact. Proprioception is intact. No  peripheral adenopathy or edema is identified. No motor or sensory levels are noted. Crude visual fields are within normal range.  RADIOLOGY RESULTS: No current films to review  PLAN: Present time patient is doing well very low side effect profile from her recent high-dose-rate remote afterloading.  I am pleased with her overall progress.  I am referring her to medical oncology for consideration of antiestrogen therapy.  I will see her back in 4 to 5 months for follow-up.  Patient knows to call with any concerns.  I would like to take this opportunity to thank you for allowing me to participate in the care of your patient.Noreene Filbert, MD

## 2021-05-03 ENCOUNTER — Inpatient Hospital Stay: Payer: Medicare HMO | Attending: Oncology | Admitting: Oncology

## 2021-05-03 ENCOUNTER — Inpatient Hospital Stay: Payer: Medicare HMO

## 2021-05-03 ENCOUNTER — Other Ambulatory Visit: Payer: Self-pay

## 2021-05-03 ENCOUNTER — Encounter: Payer: Self-pay | Admitting: Oncology

## 2021-05-03 VITALS — BP 169/72 | HR 92 | Temp 96.9°F | Resp 20 | Ht 62.0 in | Wt 236.6 lb

## 2021-05-03 DIAGNOSIS — Z7189 Other specified counseling: Secondary | ICD-10-CM

## 2021-05-03 DIAGNOSIS — Z87891 Personal history of nicotine dependence: Secondary | ICD-10-CM | POA: Diagnosis not present

## 2021-05-03 DIAGNOSIS — N189 Chronic kidney disease, unspecified: Secondary | ICD-10-CM | POA: Insufficient documentation

## 2021-05-03 DIAGNOSIS — E1122 Type 2 diabetes mellitus with diabetic chronic kidney disease: Secondary | ICD-10-CM | POA: Insufficient documentation

## 2021-05-03 DIAGNOSIS — Z853 Personal history of malignant neoplasm of breast: Secondary | ICD-10-CM | POA: Insufficient documentation

## 2021-05-03 DIAGNOSIS — Z794 Long term (current) use of insulin: Secondary | ICD-10-CM | POA: Diagnosis not present

## 2021-05-03 DIAGNOSIS — M1990 Unspecified osteoarthritis, unspecified site: Secondary | ICD-10-CM | POA: Diagnosis not present

## 2021-05-03 DIAGNOSIS — I129 Hypertensive chronic kidney disease with stage 1 through stage 4 chronic kidney disease, or unspecified chronic kidney disease: Secondary | ICD-10-CM | POA: Diagnosis not present

## 2021-05-03 DIAGNOSIS — Z801 Family history of malignant neoplasm of trachea, bronchus and lung: Secondary | ICD-10-CM | POA: Insufficient documentation

## 2021-05-03 DIAGNOSIS — J45909 Unspecified asthma, uncomplicated: Secondary | ICD-10-CM | POA: Diagnosis not present

## 2021-05-03 DIAGNOSIS — Z923 Personal history of irradiation: Secondary | ICD-10-CM | POA: Diagnosis not present

## 2021-05-03 DIAGNOSIS — Z79899 Other long term (current) drug therapy: Secondary | ICD-10-CM | POA: Diagnosis not present

## 2021-05-03 DIAGNOSIS — D0512 Intraductal carcinoma in situ of left breast: Secondary | ICD-10-CM | POA: Diagnosis not present

## 2021-05-03 DIAGNOSIS — C50211 Malignant neoplasm of upper-inner quadrant of right female breast: Secondary | ICD-10-CM

## 2021-05-03 LAB — CBC WITH DIFFERENTIAL/PLATELET
Abs Immature Granulocytes: 0.01 10*3/uL (ref 0.00–0.07)
Basophils Absolute: 0 10*3/uL (ref 0.0–0.1)
Basophils Relative: 1 %
Eosinophils Absolute: 0.2 10*3/uL (ref 0.0–0.5)
Eosinophils Relative: 4 %
HCT: 35.8 % — ABNORMAL LOW (ref 36.0–46.0)
Hemoglobin: 11.5 g/dL — ABNORMAL LOW (ref 12.0–15.0)
Immature Granulocytes: 0 %
Lymphocytes Relative: 24 %
Lymphs Abs: 1 10*3/uL (ref 0.7–4.0)
MCH: 29.3 pg (ref 26.0–34.0)
MCHC: 32.1 g/dL (ref 30.0–36.0)
MCV: 91.1 fL (ref 80.0–100.0)
Monocytes Absolute: 0.6 10*3/uL (ref 0.1–1.0)
Monocytes Relative: 14 %
Neutro Abs: 2.5 10*3/uL (ref 1.7–7.7)
Neutrophils Relative %: 57 %
Platelets: 241 10*3/uL (ref 150–400)
RBC: 3.93 MIL/uL (ref 3.87–5.11)
RDW: 15.9 % — ABNORMAL HIGH (ref 11.5–15.5)
WBC: 4.3 10*3/uL (ref 4.0–10.5)
nRBC: 0 % (ref 0.0–0.2)

## 2021-05-03 LAB — COMPREHENSIVE METABOLIC PANEL
ALT: 20 U/L (ref 0–44)
AST: 22 U/L (ref 15–41)
Albumin: 4 g/dL (ref 3.5–5.0)
Alkaline Phosphatase: 99 U/L (ref 38–126)
Anion gap: 10 (ref 5–15)
BUN: 35 mg/dL — ABNORMAL HIGH (ref 8–23)
CO2: 19 mmol/L — ABNORMAL LOW (ref 22–32)
Calcium: 9.1 mg/dL (ref 8.9–10.3)
Chloride: 108 mmol/L (ref 98–111)
Creatinine, Ser: 2.01 mg/dL — ABNORMAL HIGH (ref 0.44–1.00)
GFR, Estimated: 25 mL/min — ABNORMAL LOW (ref >60)
Glucose, Bld: 135 mg/dL — ABNORMAL HIGH (ref 70–99)
Potassium: 4.5 mmol/L (ref 3.5–5.1)
Sodium: 137 mmol/L (ref 135–145)
Total Bilirubin: 0.2 mg/dL — ABNORMAL LOW (ref 0.3–1.2)
Total Protein: 7.3 g/dL (ref 6.5–8.1)

## 2021-05-03 MED ORDER — ANASTROZOLE 1 MG PO TABS: 1.0000 mg | ORAL_TABLET | Freq: Every day | ORAL | 0 refills | Status: DC

## 2021-05-03 NOTE — Progress Notes (Signed)
Hematology/Oncology Consult note [phone] [phone]         Patient Care Team: Juluis Pitch, MD as PCP - General (Family Medicine) Theodore Demark, RN as Oncology Nurse Navigator Earlie Server, MD as Consulting Physician (Oncology) Noreene Filbert, MD as Consulting Physician (Radiation Oncology) Gabriel Carina Betsey Holiday, MD as Consulting Physician (Endocrinology) Bary Castilla Forest Gleason, MD as Consulting Physician (General Surgery)  REFERRING PROVIDER: Noreene Filbert, MD  CHIEF COMPLAINTS/REASON FOR VISIT:  Evaluation of breast cancer  HISTORY OF PRESENTING ILLNESS:   Alexis Greer is a  81 y.o.  female with PMH listed below was seen in consultation at the request of  Noreene Filbert, MD  for evaluation of breast cancer  Patient has a history of breast cancer in 2016.  Managed by surgery and radiation oncology. 11/17/2014, patient underwent right breast biopsy which showed invasive mammary carcinoma, grade 3, tumor spans up to 5 mm no special type, ER negative, PR negative, HER2 IHC equivocal 2+, FISH negative. Patient underwent right breast lumpectomy and sentinel lymph node biopsy.  No residual malignancy found on her lumpectomy specimen.  Sclerosing adenosis with apocrine metaplasia.  Stromal hemorrhage. She received radiation adjuvantly.  12/13/2020, bilateral breast screening mammogram showed left breast calcification warrants further evaluation.  Right breast no findings suspicious for malignancy. 12/20/2020, diagnostic left mammogram showed left breast upper outer quadrant indeterminate group of calcification. 12/27/2020, left breast upper outer stereotactic biopsy showed high-grade DCIS, comedo type with associated calcifications. 02/04/2021, patient had left breast lumpectomy which showed focal residual DCIS, low-grade.  No evidence of invasive carcinoma.  All margins were negative for DCIS.  pTis,pNx, estrogen receptor positive,> 90%  Patient was seen by  radiation oncology Dr. Huey Bienenstock and she received adjuvant radiation. Patient was referred to establish care with oncology for evaluation.  Patient was accompanied by daughter.  Patient has a history of arthritis, asthma, CKD, diabetes, GERD, hypertension and obesity.  Family history is negative for breast cancer.  Positive for lung cancer in sister.  Review of Systems  Constitutional:  Positive for fatigue. Negative for appetite change, chills and fever.  HENT:   Negative for hearing loss and voice change.   Eyes:  Negative for eye problems.  Respiratory:  Negative for chest tightness and cough.   Cardiovascular:  Negative for chest pain.  Gastrointestinal:  Negative for abdominal distention, abdominal pain and blood in stool.  Endocrine: Negative for hot flashes.  Genitourinary:  Negative for difficulty urinating and frequency.   Musculoskeletal:  Positive for arthralgias.  Skin:  Negative for itching and rash.  Neurological:  Negative for extremity weakness.  Hematological:  Negative for adenopathy.  Psychiatric/Behavioral:  Negative for confusion.    MEDICAL HISTORY:  Past Medical History:  Diagnosis Date   Anemia    Arthritis    Asthma    B12 deficiency    Breast cancer ([place]) 11/17/2014   right breast lumpectomy, triple negative, with mammosite tx. Chemo with held due to CVA after lumpectomy   CKD (chronic kidney disease), stage IV (HCC)    Complication of anesthesia    had a stroke during the breast lumpectomy   Diabetes mellitus without complication ([place])    Dyspnea    GERD (gastroesophageal reflux disease)    Gout    History of hiatal hernia    Hyperlipidemia    Hyperparathyroidism ([place])    Hypertension    Malignant neoplasm ([place])    Morbid obesity ([place])    Personal history of radiation therapy 2016  mammosite radiation-RIGHT lumpectomy   Pneumonia    Stroke [facility]) 2016   post lumpectomy   Vitamin D deficiency     SURGICAL HISTORY: Past Surgical History:   Procedure Laterality Date   ABDOMINAL HYSTERECTOMY  1979   BREAST BIOPSY Right 11/17/2014   Invasive mammory carcinoma   BREAST BIOPSY Right 12/01/2014   Procedure: BREAST BIOPSY WITH NEEDLE LOCALIZATION;  Surgeon: Christene Lye, MD;  Location: [facility] ORS;  Service: General;  Laterality: Right;   BREAST BIOPSY Left 12/27/2020   Affirm bx-asymmetry/calcs "Ribbon" clip-path pending   BREAST LUMPECTOMY Right 2016   INVASIVE MAMMARY CARCINOMA    BREAST LUMPECTOMY WITH SENTINEL LYMPH NODE BIOPSY Right 12/01/2014   Procedure: BREAST LUMPECTOMY WITH SENTINEL LYMPH NODE BX;  Surgeon: Christene Lye, MD;  Location: [facility] ORS;  Service: General;  Laterality: Right;   CHOLECYSTECTOMY  2020   COLONOSCOPY WITH PROPOFOL N/A 06/13/2016   Procedure: COLONOSCOPY WITH PROPOFOL;  Surgeon: Lollie Sails, MD;  Location: [facility] ENDOSCOPY;  Service: Endoscopy;  Laterality: N/A;   COLONOSCOPY WITH PROPOFOL N/A 12/03/2019   Procedure: COLONOSCOPY WITH PROPOFOL;  Surgeon: Toledo, Benay Pike, MD;  Location: [facility] ENDOSCOPY;  Service: Gastroenterology;  Laterality: N/A;   EYE SURGERY Bilateral    CATARACT REMOVAL   LOWER EXTREMITY INTERVENTION Left 11/09/2016   Procedure: LOWER EXTREMITY INTERVENTION;  Surgeon: Algernon Huxley, MD;  Location: [facility] CV LAB;  Service: Cardiovascular;  Laterality: Left;   MASTECTOMY Right    lumpectomy    OOPHORECTOMY  2014   PARATHYROIDECTOMY N/A 07/14/2020   Procedure: PARATHYROIDECTOMY, RNFA to assist;  Surgeon: Fredirick Maudlin, MD;  Location: [facility] ORS;  Service: General;  Laterality: N/A;   PARTIAL MASTECTOMY WITH NEEDLE LOCALIZATION Left 02/04/2021   Procedure: PARTIAL MASTECTOMY WITH NEEDLE LOCALIZATION;  Surgeon: Herbert Pun, MD;  Location: [facility] ORS;  Service: General;  Laterality: Left;   TONSILLECTOMY      SOCIAL HISTORY: Social History   Socioeconomic History   Marital status: Divorced    Spouse name: Not on file   Number of children: 2    Years of education: 12   Highest education level: High school graduate  Occupational History   Not on file  Tobacco Use   Smoking status: Former    Types: Cigarettes    Quit date: 08/25/1993    Years since quitting: 27.7   Smokeless tobacco: Never  Vaping Use   Vaping Use: Never used  Substance and Sexual Activity   Alcohol use: No    Alcohol/week: 0.0 standard drinks   Drug use: No   Sexual activity: Not Currently  Other Topics Concern   Not on file  Social History Narrative   Patient is a Sales promotion account executive Witness   Social Determinants of Radio broadcast assistant Strain: Not on file  Food Insecurity: Not on file  Transportation Needs: Not on file  Physical Activity: Not on file  Stress: Not on file  Social Connections: Not on file  Intimate Partner Violence: Not on file    FAMILY HISTORY: Family History  Problem Relation Age of Onset   CAD Father    Lung cancer Sister        smoker   Thyroid disease Daughter    Thyroid disease Daughter    Breast cancer Neg Hx     ALLERGIES:  is allergic to oxycodone.  MEDICATIONS:  Current Outpatient Medications  Medication Sig Dispense Refill   ACCU-CHEK AVIVA PLUS test strip      acetaminophen (  TYLENOL) 325 MG tablet Take 650 mg by mouth every 6 (six) hours as needed for moderate pain or headache.     allopurinol (ZYLOPRIM) 100 MG tablet Take 100 mg by mouth 2 (two) times daily.      amLODipine (NORVASC) 10 MG tablet Take 10 mg by mouth daily.     anastrozole (ARIMIDEX) 1 MG tablet Take 1 tablet (1 mg total) by mouth daily. 30 tablet 0   aspirin EC 81 MG tablet Take 81 mg by mouth daily.     atorvastatin (LIPITOR) 40 MG tablet Take 40 mg by mouth daily at 6 PM.     Cholecalciferol (VITAMIN D) 125 MCG (5000 UT) CAPS Take 5,000 Units by mouth daily.     Cholecalciferol (VITAMIN D3) 125 MCG (5000 UT) CAPS Take by mouth.     diphenhydrAMINE (BENADRYL) 25 MG tablet Take 50 mg by mouth every 6 (six) hours as needed for allergies.      DROPLET PEN NEEDLES 31G X 5 MM MISC      ezetimibe (ZETIA) 10 MG tablet Take 10 mg by mouth daily.     Insulin Isophane & Regular Human (NOVOLIN 70/30 FLEXPEN) (70-30) 100 UNIT/ML PEN Inject 5 Units into the skin 2 (two) times daily. (Patient taking differently: Inject 22-38 Units into the skin See admin instructions. Inject 22 units in the morning with breakfast and 38 units in the evening with dinner (half the dose if blood sugar is under 70)) 15 mL 11   losartan (COZAAR) 25 MG tablet Take 25 mg by mouth daily.      magnesium oxide (MAG-OX) 400 MG tablet Take 800 mg by mouth 2 (two) times daily.     metoprolol succinate (TOPROL-XL) 50 MG 24 hr tablet Take 50 mg by mouth at bedtime.     Multiple Vitamins-Minerals (MULTIVITAMIN WITH MINERALS) tablet Take 1 tablet by mouth daily.     omeprazole (PRILOSEC) 20 MG capsule Take 20 mg by mouth 2 (two) times daily.     vitamin B-12 (CYANOCOBALAMIN) 1000 MCG tablet Take 1,000 mcg by mouth daily.     vitamin E 400 UNIT capsule Take 400 Units by mouth daily.      No current facility-administered medications for this visit.     PHYSICAL EXAMINATION: ECOG PERFORMANCE STATUS: 1 - Symptomatic but completely ambulatory Vitals:   05/03/21 1023  BP: (!) 169/72  Pulse: 92  Resp: 20  Temp: (!) 96.9 F (36.1 C)  SpO2: 97%   Filed Weights   05/03/21 1023  Weight: 236 lb 9.6 oz (107.3 kg)    Physical Exam Constitutional:      General: She is not in acute distress.    Appearance: She is obese.  HENT:     Head: Normocephalic and atraumatic.  Eyes:     General: No scleral icterus. Cardiovascular:     Rate and Rhythm: Normal rate and regular rhythm.     Heart sounds: Normal heart sounds.  Pulmonary:     Effort: Pulmonary effort is normal. No respiratory distress.     Breath sounds: No wheezing.  Abdominal:     General: Bowel sounds are normal. There is no distension.     Palpations: Abdomen is soft.  Musculoskeletal:        General: No  deformity. Normal range of motion.     Cervical back: Normal range of motion and neck supple.  Skin:    General: Skin is warm and dry.     Findings: No  erythema or rash.  Neurological:     Mental Status: She is alert and oriented to person, place, and time. Mental status is at baseline.     Cranial Nerves: No cranial nerve deficit.     Coordination: Coordination normal.  Psychiatric:        Mood and Affect: Mood normal.    LABORATORY DATA:  I have reviewed the data as listed Lab Results  Component Value Date   WBC 4.3 05/03/2021   HGB 11.5 (L) 05/03/2021   HCT 35.8 (L) 05/03/2021   MCV 91.1 05/03/2021   PLT 241 05/03/2021   Recent Labs    07/12/20 0914 05/03/21 1105  NA 139 137  K 4.2 4.5  CL 109 108  CO2 21* 19*  GLUCOSE 186* 135*  BUN 27* 35*  CREATININE 1.78* 2.01*  CALCIUM 9.9 9.1  GFRNONAA 29* 25*  PROT  --  7.3  ALBUMIN  --  4.0  AST  --  22  ALT  --  20  ALKPHOS  --  99  BILITOT  --  0.2*   Iron/TIBC/Ferritin/ %Sat No results found for: IRON, TIBC, FERRITIN, IRONPCTSAT    RADIOGRAPHIC STUDIES: I have personally reviewed the radiological images as listed and agreed with the findings in the report. No results found.    ASSESSMENT & PLAN:  1. Ductal carcinoma in situ (DCIS) of left breast   2. History of right breast cancer   3. Goals of care, counseling/discussion    #DCIS of the left breast.  ER positive.  Status postlumpectomy and adjuvant radiation. Rationale of using aromatase inhibitor -Arimidex  discussed with patient.  Side effects of Arimidex including but not limited to hot flush, joint pain, fatigue, mood swing, osteoporosis discussed with patient. Patient and daughter are undecided.  I sent patient a prescription of 1 month supply of Arimidex if she decides to take patient and I asked patient to update me if she starts the treatment. Patient has bone density done which was normal in 2021.  Plan to repeat this year.  Rectal Patient to take  calcium and vitamin D supplementation.  Recommend patient to follow-up with me in 6 months or earlier if she decides to take Arimidex and we will set her up for a visit 4 to 6 weeks after starting the Arimidex for evaluation of tolerability.  Check CBC and CMP today.  Normocytic anemia, hemoglobin 11.5.  Likely secondary to chronic kidney disease.  Orders Placed This Encounter  Procedures   CBC with Differential/Platelet    Standing Status:   Future    Number of Occurrences:   1    Standing Expiration Date:   05/03/2022   Comprehensive metabolic panel    Standing Status:   Future    Number of Occurrences:   1    Standing Expiration Date:   05/03/2022    All questions were answered. The patient knows to call the clinic with any problems questions or concerns.  cc Noreene Filbert, MD    Thank you for this kind referral and the opportunity to participate in the care of this patient. A copy of today's note is routed to referring provider   Earlie Server, MD, PhD [facility] Hematology Oncology 05/03/2021

## 2021-05-03 NOTE — Progress Notes (Signed)
New pt and daughter in for visit for pt with left breast cancer.

## 2021-05-04 ENCOUNTER — Other Ambulatory Visit: Payer: Self-pay | Admitting: Oncology

## 2021-05-04 ENCOUNTER — Telehealth: Payer: Self-pay | Admitting: *Deleted

## 2021-05-04 NOTE — Telephone Encounter (Signed)
Patient daughter called stating that patient has decided that she will try the Anastrozole and has picked up prescription from pharmacy and wanted you to know

## 2021-05-05 ENCOUNTER — Other Ambulatory Visit: Payer: Self-pay

## 2021-05-05 DIAGNOSIS — D0512 Intraductal carcinoma in situ of left breast: Secondary | ICD-10-CM

## 2021-05-05 NOTE — Telephone Encounter (Signed)
Please schedule lab/MD in 6 weeks (cbc,cmp). Keep other future appts scheduled.

## 2021-05-05 NOTE — Telephone Encounter (Signed)
Spoke to pt, agreeable with appts

## 2021-06-10 ENCOUNTER — Other Ambulatory Visit: Payer: Self-pay | Admitting: *Deleted

## 2021-06-10 MED ORDER — ANASTROZOLE 1 MG PO TABS: 1.0000 mg | ORAL_TABLET | Freq: Every day | ORAL | 0 refills | Status: DC

## 2021-06-17 ENCOUNTER — Inpatient Hospital Stay: Payer: Medicare HMO | Admitting: Oncology

## 2021-06-17 ENCOUNTER — Encounter: Payer: Self-pay | Admitting: Oncology

## 2021-06-17 ENCOUNTER — Other Ambulatory Visit: Payer: Medicare HMO

## 2021-06-17 ENCOUNTER — Inpatient Hospital Stay: Payer: Medicare HMO | Attending: Oncology

## 2021-06-17 ENCOUNTER — Other Ambulatory Visit: Payer: Self-pay

## 2021-06-17 ENCOUNTER — Ambulatory Visit: Payer: Medicare HMO | Admitting: Oncology

## 2021-06-17 VITALS — BP 157/68 | HR 82 | Temp 98.7°F | Resp 18 | Wt 234.6 lb

## 2021-06-17 DIAGNOSIS — Z79899 Other long term (current) drug therapy: Secondary | ICD-10-CM | POA: Diagnosis not present

## 2021-06-17 DIAGNOSIS — D649 Anemia, unspecified: Secondary | ICD-10-CM | POA: Diagnosis not present

## 2021-06-17 DIAGNOSIS — Z923 Personal history of irradiation: Secondary | ICD-10-CM | POA: Insufficient documentation

## 2021-06-17 DIAGNOSIS — I129 Hypertensive chronic kidney disease with stage 1 through stage 4 chronic kidney disease, or unspecified chronic kidney disease: Secondary | ICD-10-CM | POA: Diagnosis not present

## 2021-06-17 DIAGNOSIS — Z853 Personal history of malignant neoplasm of breast: Secondary | ICD-10-CM | POA: Insufficient documentation

## 2021-06-17 DIAGNOSIS — Z79811 Long term (current) use of aromatase inhibitors: Secondary | ICD-10-CM

## 2021-06-17 DIAGNOSIS — D0512 Intraductal carcinoma in situ of left breast: Secondary | ICD-10-CM | POA: Insufficient documentation

## 2021-06-17 DIAGNOSIS — Z87891 Personal history of nicotine dependence: Secondary | ICD-10-CM | POA: Diagnosis not present

## 2021-06-17 DIAGNOSIS — K219 Gastro-esophageal reflux disease without esophagitis: Secondary | ICD-10-CM | POA: Insufficient documentation

## 2021-06-17 DIAGNOSIS — E1122 Type 2 diabetes mellitus with diabetic chronic kidney disease: Secondary | ICD-10-CM | POA: Insufficient documentation

## 2021-06-17 DIAGNOSIS — N189 Chronic kidney disease, unspecified: Secondary | ICD-10-CM | POA: Insufficient documentation

## 2021-06-17 LAB — COMPREHENSIVE METABOLIC PANEL
ALT: 17 U/L (ref 0–44)
AST: 22 U/L (ref 15–41)
Albumin: 3.9 g/dL (ref 3.5–5.0)
Alkaline Phosphatase: 96 U/L (ref 38–126)
Anion gap: 6 (ref 5–15)
BUN: 50 mg/dL — ABNORMAL HIGH (ref 8–23)
CO2: 21 mmol/L — ABNORMAL LOW (ref 22–32)
Calcium: 8.9 mg/dL (ref 8.9–10.3)
Chloride: 109 mmol/L (ref 98–111)
Creatinine, Ser: 2.15 mg/dL — ABNORMAL HIGH (ref 0.44–1.00)
GFR, Estimated: 23 mL/min — ABNORMAL LOW (ref >60)
Glucose, Bld: 167 mg/dL — ABNORMAL HIGH (ref 70–99)
Potassium: 4.8 mmol/L (ref 3.5–5.1)
Sodium: 136 mmol/L (ref 135–145)
Total Bilirubin: 0.4 mg/dL (ref 0.3–1.2)
Total Protein: 7.3 g/dL (ref 6.5–8.1)

## 2021-06-17 LAB — CBC WITH DIFFERENTIAL/PLATELET
Abs Immature Granulocytes: 0.01 10*3/uL (ref 0.00–0.07)
Basophils Absolute: 0.1 10*3/uL (ref 0.0–0.1)
Basophils Relative: 1 %
Eosinophils Absolute: 0.2 10*3/uL (ref 0.0–0.5)
Eosinophils Relative: 4 %
HCT: 34.8 % — ABNORMAL LOW (ref 36.0–46.0)
Hemoglobin: 11.3 g/dL — ABNORMAL LOW (ref 12.0–15.0)
Immature Granulocytes: 0 %
Lymphocytes Relative: 28 %
Lymphs Abs: 1.4 10*3/uL (ref 0.7–4.0)
MCH: 29.7 pg (ref 26.0–34.0)
MCHC: 32.5 g/dL (ref 30.0–36.0)
MCV: 91.3 fL (ref 80.0–100.0)
Monocytes Absolute: 0.7 10*3/uL (ref 0.1–1.0)
Monocytes Relative: 13 %
Neutro Abs: 2.9 10*3/uL (ref 1.7–7.7)
Neutrophils Relative %: 54 %
Platelets: 245 10*3/uL (ref 150–400)
RBC: 3.81 MIL/uL — ABNORMAL LOW (ref 3.87–5.11)
RDW: 15.6 % — ABNORMAL HIGH (ref 11.5–15.5)
WBC: 5.2 10*3/uL (ref 4.0–10.5)
nRBC: 0 % (ref 0.0–0.2)

## 2021-06-17 MED ORDER — ANASTROZOLE 1 MG PO TABS: 1.0000 mg | ORAL_TABLET | Freq: Every day | ORAL | 2 refills | Status: DC

## 2021-06-17 NOTE — Progress Notes (Signed)
Pt here for follow up. No new concerns voiced.   

## 2021-06-17 NOTE — Progress Notes (Signed)
?Hematology/Oncology Consult note ?[phone] [phone] ?  ? ?   ? ? ?Patient Care Team: ?Juluis Pitch, MD as PCP - General (Family Medicine) ?Theodore Demark, RN as Oncology Nurse Navigator ?Earlie Server, MD as Consulting Physician (Oncology) ?Noreene Filbert, MD as Consulting Physician (Radiation Oncology) ?Solum, Betsey Holiday, MD as Consulting Physician (Endocrinology) ?Robert Bellow, MD as Consulting Physician (General Surgery) ? ?REFERRING PROVIDER: ?Juluis Pitch, MD  ?CHIEF COMPLAINTS/REASON FOR VISIT:  ?Follow up breast cancer ? ?HISTORY OF PRESENTING ILLNESS:  ? ?Alexis Greer is a  81 y.o.  female with PMH listed below was seen in consultation at the request of  Juluis Pitch, MD  for evaluation of breast cancer ? ?Patient has a history of breast cancer in 2016.  Managed by surgery and radiation oncology. ?11/17/2014, patient underwent right breast biopsy which showed invasive mammary carcinoma, grade 3, tumor spans up to 5 mm no special type, ER negative, PR negative, HER2 IHC equivocal 2+, FISH negative. ?Patient underwent right breast lumpectomy and sentinel lymph node biopsy.  No residual malignancy found on her lumpectomy specimen.  Sclerosing adenosis with apocrine metaplasia.  Stromal hemorrhage. ?She received radiation adjuvantly. ? ?12/13/2020, bilateral breast screening mammogram showed left breast calcification warrants further evaluation.  Right breast no findings suspicious for malignancy. ?12/20/2020, diagnostic left mammogram showed left breast upper outer quadrant indeterminate group of calcification. ?12/27/2020, left breast upper outer stereotactic biopsy showed high-grade DCIS, comedo type with associated calcifications. ?02/04/2021, patient had left breast lumpectomy which showed focal residual DCIS, low-grade.  No evidence of invasive carcinoma.  All margins were negative for DCIS.  pTis,pNx, estrogen receptor positive,> 90% ? ?Patient was seen by  radiation oncology Dr. Huey Bienenstock and she received adjuvant radiation. ?Patient was referred to establish care with oncology for evaluation. ? ?Patient was accompanied by daughter.  Patient has a history of arthritis, asthma, CKD, diabetes, GERD, hypertension and obesity.  Family history is negative for breast cancer.  Positive for lung cancer in sister. ? ? ?INTERVAL HISTORY ?CHRISHA VOGEL is a 81 y.o. female who has above history reviewed by me today presents for follow up visit for left breast high-grade DCIS ? ?Patient reports feeling well.  She takes Arimidex 1 mg daily.  Overall tolerates well with some side effects.  Chronic joint pain. ?Review of Systems  ?Constitutional:  Positive for fatigue. Negative for appetite change, chills and fever.  ?HENT:   Negative for hearing loss and voice change.   ?Eyes:  Negative for eye problems.  ?Respiratory:  Negative for chest tightness and cough.   ?Cardiovascular:  Negative for chest pain.  ?Gastrointestinal:  Negative for abdominal distention, abdominal pain and blood in stool.  ?Endocrine: Negative for hot flashes.  ?Genitourinary:  Negative for difficulty urinating and frequency.   ?Musculoskeletal:  Positive for arthralgias.  ?Skin:  Negative for itching and rash.  ?Neurological:  Negative for extremity weakness.  ?Hematological:  Negative for adenopathy.  ?Psychiatric/Behavioral:  Negative for confusion.   ? ?MEDICAL HISTORY:  ?Past Medical History:  ?Diagnosis Date  ? Anemia   ? Arthritis   ? Asthma   ? B12 deficiency   ? Breast cancer ([place]) 11/17/2014  ? right breast lumpectomy, triple negative, with mammosite tx. Chemo with held due to CVA after lumpectomy  ? CKD (chronic kidney disease), stage IV ([place])   ? Complication of anesthesia   ? had a stroke during the breast lumpectomy  ? Diabetes mellitus without complication ([place])   ?  Dyspnea   ? GERD (gastroesophageal reflux disease)   ? Gout   ? History of hiatal hernia   ? Hyperlipidemia   ?  Hyperparathyroidism ([place])   ? Hypertension   ? Malignant neoplasm ([place])   ? Morbid obesity ([place])   ? Personal history of radiation therapy 2016  ?  mammosite radiation-RIGHT lumpectomy  ? Pneumonia   ? Stroke [facility]) 2016  ? post lumpectomy  ? Vitamin D deficiency   ? ? ?SURGICAL HISTORY: ?Past Surgical History:  ?Procedure Laterality Date  ? ABDOMINAL HYSTERECTOMY  1979  ? BREAST BIOPSY Right 11/17/2014  ? Invasive mammory carcinoma  ? BREAST BIOPSY Right 12/01/2014  ? Procedure: BREAST BIOPSY WITH NEEDLE LOCALIZATION;  Surgeon: Christene Lye, MD;  Location: [facility] ORS;  Service: General;  Laterality: Right;  ? BREAST BIOPSY Left 12/27/2020  ? Affirm bx-asymmetry/calcs "Ribbon" clip-path pending  ? BREAST LUMPECTOMY Right 2016  ? INVASIVE MAMMARY CARCINOMA   ? BREAST LUMPECTOMY WITH SENTINEL LYMPH NODE BIOPSY Right 12/01/2014  ? Procedure: BREAST LUMPECTOMY WITH SENTINEL LYMPH NODE BX;  Surgeon: Christene Lye, MD;  Location: [facility] ORS;  Service: General;  Laterality: Right;  ? CHOLECYSTECTOMY  2020  ? COLONOSCOPY WITH PROPOFOL N/A 06/13/2016  ? Procedure: COLONOSCOPY WITH PROPOFOL;  Surgeon: Lollie Sails, MD;  Location: [facility] ENDOSCOPY;  Service: Endoscopy;  Laterality: N/A;  ? COLONOSCOPY WITH PROPOFOL N/A 12/03/2019  ? Procedure: COLONOSCOPY WITH PROPOFOL;  Surgeon: Toledo, Benay Pike, MD;  Location: [facility] ENDOSCOPY;  Service: Gastroenterology;  Laterality: N/A;  ? EYE SURGERY Bilateral   ? CATARACT REMOVAL  ? LOWER EXTREMITY INTERVENTION Left 11/09/2016  ? Procedure: LOWER EXTREMITY INTERVENTION;  Surgeon: Algernon Huxley, MD;  Location: [facility] CV LAB;  Service: Cardiovascular;  Laterality: Left;  ? MASTECTOMY Right   ? lumpectomy   ? OOPHORECTOMY  2014  ? PARATHYROIDECTOMY N/A 07/14/2020  ? Procedure: PARATHYROIDECTOMY, RNFA to assist;  Surgeon: Fredirick Maudlin, MD;  Location: [facility] ORS;  Service: General;  Laterality: N/A;  ? PARTIAL MASTECTOMY WITH NEEDLE LOCALIZATION Left 02/04/2021  ?  Procedure: PARTIAL MASTECTOMY WITH NEEDLE LOCALIZATION;  Surgeon: Herbert Pun, MD;  Location: [facility] ORS;  Service: General;  Laterality: Left;  ? TONSILLECTOMY    ? ? ?SOCIAL HISTORY: ?Social History  ? ?Socioeconomic History  ? Marital status: Divorced  ?  Spouse name: Not on file  ? Number of children: 2  ? Years of education: 83  ? Highest education level: High school graduate  ?Occupational History  ? Not on file  ?Tobacco Use  ? Smoking status: Former  ?  Types: Cigarettes  ?  Quit date: 08/25/1993  ?  Years since quitting: 27.8  ? Smokeless tobacco: Never  ?Vaping Use  ? Vaping Use: Never used  ?Substance and Sexual Activity  ? Alcohol use: No  ?  Alcohol/week: 0.0 standard drinks  ? Drug use: No  ? Sexual activity: Not Currently  ?Other Topics Concern  ? Not on file  ?Social History Narrative  ? Patient is a Sales promotion account executive Witness  ? ?Social Determinants of Health  ? ?Financial Resource Strain: Not on file  ?Food Insecurity: Not on file  ?Transportation Needs: Not on file  ?Physical Activity: Not on file  ?Stress: Not on file  ?Social Connections: Not on file  ?Intimate Partner Violence: Not on file  ? ? ?FAMILY HISTORY: ?Family History  ?Problem Relation Age of Onset  ? CAD Father   ? Lung cancer Sister   ?  smoker  ? Thyroid disease Daughter   ? Thyroid disease Daughter   ? Breast cancer Neg Hx   ? ? ?ALLERGIES:  is allergic to oxycodone. ? ?MEDICATIONS:  ?Current Outpatient Medications  ?Medication Sig Dispense Refill  ? ACCU-CHEK AVIVA PLUS test strip     ? acetaminophen (TYLENOL) 325 MG tablet Take 650 mg by mouth every 6 (six) hours as needed for moderate pain or headache.    ? allopurinol (ZYLOPRIM) 100 MG tablet Take 100 mg by mouth 2 (two) times daily.     ? amLODipine (NORVASC) 10 MG tablet Take 10 mg by mouth daily.    ? aspirin EC 81 MG tablet Take 81 mg by mouth daily.    ? atorvastatin (LIPITOR) 40 MG tablet Take 40 mg by mouth daily at 6 PM.    ? Cholecalciferol (VITAMIN D) 125 MCG (5000  UT) CAPS Take 5,000 Units by mouth daily.    ? Cholecalciferol (VITAMIN D3) 125 MCG (5000 UT) CAPS Take by mouth.    ? diphenhydrAMINE (BENADRYL) 25 MG tablet Take 50 mg by mouth every 6 (six) hours as needed for allergies.    ? D

## 2021-06-20 ENCOUNTER — Telehealth: Payer: Self-pay

## 2021-06-20 DIAGNOSIS — D0512 Intraductal carcinoma in situ of left breast: Secondary | ICD-10-CM

## 2021-06-20 NOTE — Telephone Encounter (Signed)
06/20/2021 ?Mammo scheduled for 11/29/21, pt confirmed appt srw ?

## 2021-06-20 NOTE — Telephone Encounter (Signed)
-----   Message from Earlie Server, MD sent at 06/17/2021  7:03 PM EDT ----- ?Please obtain bilateral diagnostic mammogram prior to next visit.  Thank you ? ?

## 2021-06-20 NOTE — Telephone Encounter (Signed)
Orders entered. Please schedule Mammo prior to next visit. Looks like last one was in Sept. Ok to move out appts until after mammo. Please inform pt of appts. Thanks.  ?

## 2021-06-21 ENCOUNTER — Other Ambulatory Visit: Payer: Self-pay | Admitting: *Deleted

## 2021-06-21 MED ORDER — ANASTROZOLE 1 MG PO TABS: 1.0000 mg | ORAL_TABLET | Freq: Every day | ORAL | 2 refills | Status: DC

## 2021-06-29 DIAGNOSIS — E785 Hyperlipidemia, unspecified: Secondary | ICD-10-CM | POA: Diagnosis not present

## 2021-06-29 DIAGNOSIS — E669 Obesity, unspecified: Secondary | ICD-10-CM | POA: Diagnosis not present

## 2021-06-29 DIAGNOSIS — M109 Gout, unspecified: Secondary | ICD-10-CM | POA: Diagnosis not present

## 2021-06-29 DIAGNOSIS — Z0000 Encounter for general adult medical examination without abnormal findings: Secondary | ICD-10-CM | POA: Diagnosis not present

## 2021-06-29 DIAGNOSIS — Z853 Personal history of malignant neoplasm of breast: Secondary | ICD-10-CM | POA: Diagnosis not present

## 2021-06-29 DIAGNOSIS — E119 Type 2 diabetes mellitus without complications: Secondary | ICD-10-CM | POA: Diagnosis not present

## 2021-06-29 DIAGNOSIS — D649 Anemia, unspecified: Secondary | ICD-10-CM | POA: Diagnosis not present

## 2021-06-29 DIAGNOSIS — K219 Gastro-esophageal reflux disease without esophagitis: Secondary | ICD-10-CM | POA: Diagnosis not present

## 2021-06-29 DIAGNOSIS — I10 Essential (primary) hypertension: Secondary | ICD-10-CM | POA: Diagnosis not present

## 2021-07-12 DIAGNOSIS — N184 Chronic kidney disease, stage 4 (severe): Secondary | ICD-10-CM | POA: Diagnosis not present

## 2021-07-12 DIAGNOSIS — R809 Proteinuria, unspecified: Secondary | ICD-10-CM | POA: Diagnosis not present

## 2021-07-12 DIAGNOSIS — E210 Primary hyperparathyroidism: Secondary | ICD-10-CM | POA: Diagnosis not present

## 2021-07-12 DIAGNOSIS — I10 Essential (primary) hypertension: Secondary | ICD-10-CM | POA: Diagnosis not present

## 2021-07-12 DIAGNOSIS — E1122 Type 2 diabetes mellitus with diabetic chronic kidney disease: Secondary | ICD-10-CM | POA: Diagnosis not present

## 2021-07-19 DIAGNOSIS — R600 Localized edema: Secondary | ICD-10-CM | POA: Diagnosis not present

## 2021-07-19 DIAGNOSIS — N184 Chronic kidney disease, stage 4 (severe): Secondary | ICD-10-CM | POA: Diagnosis not present

## 2021-07-19 DIAGNOSIS — E1122 Type 2 diabetes mellitus with diabetic chronic kidney disease: Secondary | ICD-10-CM | POA: Diagnosis not present

## 2021-07-19 DIAGNOSIS — E210 Primary hyperparathyroidism: Secondary | ICD-10-CM | POA: Diagnosis not present

## 2021-07-19 DIAGNOSIS — R809 Proteinuria, unspecified: Secondary | ICD-10-CM | POA: Diagnosis not present

## 2021-07-19 DIAGNOSIS — I10 Essential (primary) hypertension: Secondary | ICD-10-CM | POA: Diagnosis not present

## 2021-08-03 DIAGNOSIS — Z794 Long term (current) use of insulin: Secondary | ICD-10-CM | POA: Diagnosis not present

## 2021-08-03 DIAGNOSIS — E042 Nontoxic multinodular goiter: Secondary | ICD-10-CM | POA: Diagnosis not present

## 2021-08-03 DIAGNOSIS — E1122 Type 2 diabetes mellitus with diabetic chronic kidney disease: Secondary | ICD-10-CM | POA: Diagnosis not present

## 2021-08-03 DIAGNOSIS — N184 Chronic kidney disease, stage 4 (severe): Secondary | ICD-10-CM | POA: Diagnosis not present

## 2021-08-11 DIAGNOSIS — Z794 Long term (current) use of insulin: Secondary | ICD-10-CM | POA: Diagnosis not present

## 2021-08-11 DIAGNOSIS — E1122 Type 2 diabetes mellitus with diabetic chronic kidney disease: Secondary | ICD-10-CM | POA: Diagnosis not present

## 2021-08-11 DIAGNOSIS — I10 Essential (primary) hypertension: Secondary | ICD-10-CM | POA: Diagnosis not present

## 2021-08-11 DIAGNOSIS — N2581 Secondary hyperparathyroidism of renal origin: Secondary | ICD-10-CM | POA: Diagnosis not present

## 2021-08-11 DIAGNOSIS — N184 Chronic kidney disease, stage 4 (severe): Secondary | ICD-10-CM | POA: Diagnosis not present

## 2021-08-11 DIAGNOSIS — E042 Nontoxic multinodular goiter: Secondary | ICD-10-CM | POA: Diagnosis not present

## 2021-08-11 DIAGNOSIS — R809 Proteinuria, unspecified: Secondary | ICD-10-CM | POA: Diagnosis not present

## 2021-08-11 DIAGNOSIS — E1129 Type 2 diabetes mellitus with other diabetic kidney complication: Secondary | ICD-10-CM | POA: Diagnosis not present

## 2021-10-04 DIAGNOSIS — N184 Chronic kidney disease, stage 4 (severe): Secondary | ICD-10-CM | POA: Diagnosis not present

## 2021-10-06 ENCOUNTER — Ambulatory Visit
Admission: RE | Admit: 2021-10-06 | Discharge: 2021-10-06 | Disposition: A | Discharge status: Home / Self Care | Payer: Medicare HMO | Source: Ambulatory Visit | Attending: Radiation Oncology | Admitting: Radiation Oncology

## 2021-10-06 ENCOUNTER — Encounter: Payer: Self-pay | Admitting: Radiation Oncology

## 2021-10-06 VITALS — BP 154/84 | HR 70 | Temp 98.7°F | Resp 20 | Wt 234.0 lb

## 2021-10-06 DIAGNOSIS — Z170 Estrogen receptor positive status [ER+]: Secondary | ICD-10-CM | POA: Diagnosis not present

## 2021-10-06 DIAGNOSIS — Z79810 Long term (current) use of selective estrogen receptor modulators (SERMs): Secondary | ICD-10-CM | POA: Diagnosis not present

## 2021-10-06 DIAGNOSIS — Z08 Encounter for follow-up examination after completed treatment for malignant neoplasm: Secondary | ICD-10-CM | POA: Diagnosis not present

## 2021-10-06 DIAGNOSIS — C50211 Malignant neoplasm of upper-inner quadrant of right female breast: Secondary | ICD-10-CM | POA: Diagnosis not present

## 2021-10-06 DIAGNOSIS — Z86000 Personal history of in-situ neoplasm of breast: Secondary | ICD-10-CM | POA: Diagnosis not present

## 2021-10-06 DIAGNOSIS — Z923 Personal history of irradiation: Secondary | ICD-10-CM | POA: Diagnosis not present

## 2021-10-06 DIAGNOSIS — D0512 Intraductal carcinoma in situ of left breast: Secondary | ICD-10-CM

## 2021-10-06 NOTE — Progress Notes (Signed)
Radiation Oncology Follow up Note  Name: Alexis Greer   Date:   10/06/2021 MRN:  [id number] DOB: [birth year]    This 81 y.o. female presents to the clinic today for 42-monthfollow-up status post accelerated partial breast radiation to her right breast for stage I invasive mammary carcinoma.  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  She specifically denies breast tenderness cough or bone pain.  She is currently on tamoxifen tolerating it well.  She is scheduled for mammogram next month..  COMPLICATIONS OF TREATMENT: none  FOLLOW UP COMPLIANCE: keeps appointments   PHYSICAL EXAM:  BP (!) 154/84 (BP Location: Right Wrist, Patient Position: Sitting, Cuff Size: Small)   Pulse 70   Temp 98.7 F (37.1 C) (Tympanic)   Resp 20   Wt 234 lb (106.1 kg)   BMI 42.80 kg/m  Lungs are clear to A&P cardiac examination essentially unremarkable with regular rate and rhythm. No dominant mass or nodularity is noted in either breast in 2 positions examined. Incision is well-healed. No axillary or supraclavicular adenopathy is appreciated. Cosmetic result is excellent.  Well-developed well-nourished patient in NAD. HEENT reveals PERLA, EOMI, discs not visualized.  Oral cavity is clear. No oral mucosal lesions are identified. Neck is clear without evidence of cervical or supraclavicular adenopathy. Lungs are clear to A&P. Cardiac examination is essentially unremarkable with regular rate and rhythm without murmur rub or thrill. Abdomen is benign with no organomegaly or masses noted. Motor sensory and DTR levels are equal and symmetric in the upper and lower extremities. Cranial nerves II through XII are grossly intact. Proprioception is intact. No peripheral  adenopathy or edema is identified. No motor or sensory levels are noted. Crude visual fields are within normal range.  RADIOLOGY RESULTS: No current films for review  PLAN: Present time patient is doing well with no evidence of disease now out 6 months.  I am pleased with her overall progress.  I have asked to see her back in 6 months for follow-up and then will start once year follow-up visits.  PatientKnows to call with any concerns.  I would like to take this opportunity to thank you for allowing me to participate in the care of your patient..Noreene Filbert MD

## 2021-10-26 ENCOUNTER — Other Ambulatory Visit: Payer: Self-pay

## 2021-10-26 NOTE — Patient Outreach (Signed)
[facility]) Care Management  10/26/2021  Alexis Greer Feb 25, 1941 [id number]   Telephone Screen Initial Assessment   Outreach call to patient to introduce [facility] services and assess care needs as part of benefit of PCP office and insurance plan. Successful call with patient.    Main healthcare issue/concern today: None reported by patient. She is managing and doing well.   Health Maintenance/Care Gaps:  -DM Eye Exam: pt reports she goes annually-unsure of next appt -DM Foot Exam:-pt reports she does not see podiatrist but regular MD checks/manages her foot care -A1C level:7.5-completed on 08/03/21  Conditions: Per chart review, patient has PMH that includes but not limited to DM, CKD, HTN, breast CA s/p right lumpectomy, thyroid nodule.   Medications Reviewed Today     Reviewed by Alexis Pedro, RN (Registered Nurse) on 10/26/21 at [place] List Status: <None>   Medication Order Taking? Sig Documenting Provider Last Dose Status Informant  ACCU-CHEK AVIVA PLUS test strip [id number] No  [provider] Taking Active Self  acetaminophen (TYLENOL) 325 MG tablet [id number] No Take 650 mg by mouth every 6 (six) hours as needed for moderate pain or headache. [provider] Taking Active Self  allopurinol (ZYLOPRIM) 100 MG tablet [id number] No Take 100 mg by mouth 2 (two) times daily.  [provider] Taking Active Self           Med Note Laurance Flatten, Jerelyn Scott   Wed Nov 01, 2016  1:39 PM)    amLODipine (NORVASC) 10 MG tablet [id number] No Take 10 mg by mouth daily. [provider] Taking Active Self           Med Note Laurance Flatten, Jerelyn Scott   Wed Nov 01, 2016  1:39 PM)    anastrozole (ARIMIDEX) 1 MG tablet [id number] No Take 1 tablet (1 mg total) by mouth daily. Earlie Server, MD Taking Active   aspirin EC 81 MG tablet [id number] No Take 81 mg by mouth daily. [provider] Taking Active Self           Med Note Laurance Flatten,  Jerelyn Scott   Wed Nov 01, 2016  1:39 PM)    atorvastatin (LIPITOR) 40 MG tablet [id number] No Take 40 mg by mouth daily at 6 PM. [provider] Taking Active Self  Cholecalciferol (VITAMIN D) 125 MCG (5000 UT) CAPS [id number] No Take 5,000 Units by mouth daily. [provider] Taking Active Self  Cholecalciferol (VITAMIN D3) 125 MCG (5000 UT) CAPS [id number] No Take by mouth. [provider] Taking Active   diphenhydrAMINE (BENADRYL) 25 MG tablet [id number] No Take 50 mg by mouth every 6 (six) hours as needed for allergies. [provider] Taking Active Self  DROPLET PEN NEEDLES 31G X 5 MM [place] [id number] No  [provider] Taking Active Self  ezetimibe (ZETIA) 10 MG tablet [id number] No Take 10 mg by mouth daily. [provider] Taking Active Self  Insulin Isophane & Regular Human (NOVOLIN 70/30 FLEXPEN) (70-30) 100 UNIT/ML PEN [id number] No Inject 5 Units into the skin 2 (two) times daily.  Patient taking differently: Inject 22-38 Units into the skin See admin instructions. Inject 22 units in the morning with breakfast and 38 units in the evening with dinner (half the dose if blood sugar is under 70)   Mody, Sital, MD Taking Active   losartan (COZAAR) 25 MG tablet [id number] No Take 25 mg by mouth daily.  [provider] Taking Active Self  magnesium  oxide (MAG-OX) 400 MG tablet [id number] No Take 800 mg by mouth 2 (two) times daily. [provider] Taking Active Self           Med Note Jilda Roche A   Thu Jun 24, 2020 12:44 PM)    metoprolol succinate (TOPROL-XL) 50 MG 24 hr tablet [id number] No Take 50 mg by mouth at bedtime. [provider] Taking Active Self  Multiple Vitamins-Minerals (MULTIVITAMIN WITH MINERALS) tablet [id number] No Take 1 tablet by mouth daily. [provider] Taking Active Self  omeprazole (PRILOSEC) 20 MG capsule [id number] No Take 20 mg by mouth 2 (two) times daily. [provider]  Taking Active Self           Med Note Laurance Flatten, Jerelyn Scott   Wed Nov 01, 2016  1:40 PM)    vitamin B-12 (CYANOCOBALAMIN) 1000 MCG tablet [id number] No Take 1,000 mcg by mouth daily. [provider] Taking Active Self  vitamin E 400 UNIT capsule [id number] No Take 400 Units by mouth daily.  [provider] Taking Active Self           Med Note Carrolyn Leigh Nov 01, 2016  1:40 PM)                 04/27/2021    8:53 AM 05/03/2021   10:13 AM 06/17/2021    9:42 AM 10/06/2021    8:59 AM 10/26/2021    1:15 PM  Fall Risk  Falls in the past year?     0  Was there an injury with Fall?     0  Fall Risk Category Calculator     0  Fall Risk Category     Low  Patient Fall Risk Level High fall risk High fall risk High fall risk High fall risk Moderate fall risk  Patient at Risk for Falls Due to     Impaired balance/gait;Medication side effect;Impaired vision  Fall risk Follow up     Falls evaluation completed;Education provided       10/26/2021    1:09 PM 06/29/2016    9:01 AM 07/01/2015    8:49 AM 01/28/2015    2:59 PM  Depression screen PHQ 2/9  Decreased Interest 0 0 0 0  Down, Depressed, Hopeless 0 0 0 0  PHQ - 2 Score 0 0 0 0    SDOH Screenings   Alcohol Screen: Not on file  Depression (PHQ2-9): Low Risk  (10/26/2021)   Depression (PHQ2-9)    PHQ-2 Score: 0  Financial Resource Strain: Low Risk  (10/30/2018)   Overall Financial Resource Strain (CARDIA)    Difficulty of Paying Living Expenses: Not hard at all  Food Insecurity: No Food Insecurity (10/26/2021)   Hunger Vital Sign    Worried About Running Out of Food in the Last Year: Never true    Ran Out of Food in the Last Year: Never true  Housing: Not on file  Physical Activity: Inactive (10/30/2018)   Exercise Vital Sign    Days of Exercise per Week: 0 days    Minutes of Exercise per Session: 0 min  Social Connections: Moderately Integrated (10/30/2018)   Social Connection and Isolation Panel [NHANES]    Frequency of  Communication with Friends and Family: More than three times a week    Frequency of Social Gatherings with Friends and Family: More than three times a week    Attends Religious Services: More than 4 times per year  Active Member of Clubs or Organizations: Yes    Attends Archivist Meetings: More than 4 times per year    Marital Status: Divorced  Stress: No Stress Concern Present (10/30/2018)   [place]    Feeling of Stress : Only a little  Tobacco Use: Medium Risk (10/26/2021)   Patient History    Smoking Tobacco Use: Former    Smokeless Tobacco Use: Never    Passive Exposure: Not on file  Transportation Needs: No Transportation Needs (10/26/2021)   PRAPARE - Hydrologist (Medical): No    Lack of Transportation (Non-Medical): No    Care Plan : Consulting civil engineer POC  Updates made by Alexis Pedro, RN since 10/26/2021 12:00 AM     Problem: Chronic Disease Mgmt of Chronic Condition-DM   Priority: High     Long-Range Goal: Development of POC for Mgmt of Chronic Condition-DM   Start Date: 10/26/2021  Expected End Date: 10/27/2022  Priority: High  Note:    Current Barriers:  Chronic Disease Management support and education needs related to DMII   RNCM Clinical Goal(s):  Patient will verbalize understanding of plan for management of DMII as evidenced by mgmt of chronic condition demonstrate Ongoing health management independence as evidenced by A!C level within normal parameters  through collaboration with Consulting civil engineer, provider, and care team.   Interventions: 1:1 collaboration with primary care provider regarding development and update of comprehensive plan of care as evidenced by provider attestation and co-signature Inter-disciplinary care team collaboration (see longitudinal plan of care) Evaluation of current treatment plan related to  self management and patient's  adherence to plan as established by provider   Diabetes Interventions:  (Status:  New goal.) Long Term Goal Assessed patient's understanding of A1c goal: <7% Provided education to patient about basic DM disease process Reviewed medications with patient and discussed importance of medication adherence Assessed social determinant of health barriers Lab Results  Component Value Date   HGBA1C 6.7 (H) 10/31/2018   Patient Goals/Self-Care Activities: Take all medications as prescribed Attend all scheduled provider appointments Call provider office for new concerns or questions  keep appointment with eye doctor check feet daily for cuts, sores or redness  Follow Up Plan:  Telephone follow up appointment with care management team member scheduled for:  within the month of Oct The patient has been provided with contact information for the care management team and has been advised to call with any health related questions or concerns.          Consent:[facility] services reviewed and discussed with patient. Verbal consent for services given.   Plan: RN CM discussed with patient next outreach within the month of Oct. Patient agrees to care plan and follow up. RN CM will send barriers letter and route encounter to PCP. RN CM will send welcome letter to patient.  Enzo Montgomery, RN,BSN,CCM [facility] Telephonic Care Management Coordinator Direct Phone: [phone] Toll Free: [phone] Fax: [phone]

## 2021-10-31 ENCOUNTER — Other Ambulatory Visit: Payer: Medicare HMO

## 2021-10-31 ENCOUNTER — Telehealth: Payer: Self-pay | Admitting: Oncology

## 2021-10-31 ENCOUNTER — Ambulatory Visit: Payer: Medicare HMO | Admitting: Oncology

## 2021-10-31 NOTE — Telephone Encounter (Signed)
Handled by scheduler

## 2021-10-31 NOTE — Telephone Encounter (Signed)
Pt came to center today, she was unaware that her appt had been rescheduled for tomorrow. However, in past telephone notes, MD appt was to be made to follow mammo appts. I rescheduled MD appt accordingly. Pt called to the center after leaving stating she wanted to be seen today or tomorrow. I am not sure why mammo was rescheduled from 9/5 to 9/15. She did not seem to be agreeable to be seen on 12/16/21. I informed her I would send Dr. Tasia Catchings team a message and make them aware.

## 2021-11-24 ENCOUNTER — Other Ambulatory Visit: Payer: Self-pay

## 2021-11-24 NOTE — Patient Outreach (Signed)
[facility]) Care Management  11/24/2021  Alexis Greer [birth year] [id number]   Case Transfer  Pt will be followed for care coordination by [facility] [place] [facility]. Assigned RN CM will outreach to patient.   Enzo Montgomery, RN,BSN,CCM [place] [facility] Telephonic Care Management Coordinator Direct Phone: [phone] Toll Free: [phone] Fax: [phone]

## 2021-11-29 ENCOUNTER — Other Ambulatory Visit: Payer: Medicare HMO

## 2021-12-02 ENCOUNTER — Ambulatory Visit: Payer: Medicare HMO | Admitting: Oncology

## 2021-12-02 ENCOUNTER — Other Ambulatory Visit: Payer: Medicare HMO

## 2021-12-02 DIAGNOSIS — D0512 Intraductal carcinoma in situ of left breast: Secondary | ICD-10-CM

## 2021-12-02 NOTE — Progress Notes (Signed)
Survivorship Care Plan visit completed.  Treatment summary reviewed and given to patient.  ASCO answers booklet reviewed and given to patient.  CARE program and Cancer Transitions discussed with patient along with other resources cancer center offers to patients and caregivers.  Patient verbalized understanding.    

## 2021-12-06 DIAGNOSIS — E210 Primary hyperparathyroidism: Secondary | ICD-10-CM | POA: Diagnosis not present

## 2021-12-06 DIAGNOSIS — R600 Localized edema: Secondary | ICD-10-CM | POA: Diagnosis not present

## 2021-12-06 DIAGNOSIS — I10 Essential (primary) hypertension: Secondary | ICD-10-CM | POA: Diagnosis not present

## 2021-12-06 DIAGNOSIS — R809 Proteinuria, unspecified: Secondary | ICD-10-CM | POA: Diagnosis not present

## 2021-12-06 DIAGNOSIS — N184 Chronic kidney disease, stage 4 (severe): Secondary | ICD-10-CM | POA: Diagnosis not present

## 2021-12-06 DIAGNOSIS — E1122 Type 2 diabetes mellitus with diabetic chronic kidney disease: Secondary | ICD-10-CM | POA: Diagnosis not present

## 2021-12-09 ENCOUNTER — Ambulatory Visit
Admission: RE | Admit: 2021-12-09 | Discharge: 2021-12-09 | Disposition: A | Discharge status: Home / Self Care | Payer: Medicare HMO | Source: Ambulatory Visit | Attending: Oncology | Admitting: Oncology

## 2021-12-09 ENCOUNTER — Other Ambulatory Visit: Payer: Self-pay | Admitting: Oncology

## 2021-12-09 ENCOUNTER — Other Ambulatory Visit: Payer: Medicare HMO

## 2021-12-09 DIAGNOSIS — R928 Other abnormal and inconclusive findings on diagnostic imaging of breast: Secondary | ICD-10-CM | POA: Diagnosis not present

## 2021-12-09 DIAGNOSIS — D0512 Intraductal carcinoma in situ of left breast: Secondary | ICD-10-CM

## 2021-12-09 DIAGNOSIS — N63 Unspecified lump in unspecified breast: Secondary | ICD-10-CM

## 2021-12-09 DIAGNOSIS — N6001 Solitary cyst of right breast: Secondary | ICD-10-CM | POA: Diagnosis not present

## 2021-12-12 ENCOUNTER — Ambulatory Visit: Payer: Medicare HMO | Admitting: Oncology

## 2021-12-12 ENCOUNTER — Other Ambulatory Visit: Payer: Medicare HMO

## 2021-12-13 DIAGNOSIS — R809 Proteinuria, unspecified: Secondary | ICD-10-CM | POA: Diagnosis not present

## 2021-12-13 DIAGNOSIS — E1122 Type 2 diabetes mellitus with diabetic chronic kidney disease: Secondary | ICD-10-CM | POA: Diagnosis not present

## 2021-12-13 DIAGNOSIS — N184 Chronic kidney disease, stage 4 (severe): Secondary | ICD-10-CM | POA: Diagnosis not present

## 2021-12-13 DIAGNOSIS — I10 Essential (primary) hypertension: Secondary | ICD-10-CM | POA: Diagnosis not present

## 2021-12-16 ENCOUNTER — Inpatient Hospital Stay (HOSPITAL_BASED_OUTPATIENT_CLINIC_OR_DEPARTMENT_OTHER): Payer: Medicare HMO | Admitting: Oncology

## 2021-12-16 ENCOUNTER — Inpatient Hospital Stay: Payer: Medicare HMO | Attending: Oncology

## 2021-12-16 ENCOUNTER — Encounter: Payer: Self-pay | Admitting: Oncology

## 2021-12-16 VITALS — BP 148/64 | HR 86 | Temp 98.2°F | Resp 18 | Wt 235.0 lb

## 2021-12-16 DIAGNOSIS — Z79899 Other long term (current) drug therapy: Secondary | ICD-10-CM | POA: Diagnosis not present

## 2021-12-16 DIAGNOSIS — D0512 Intraductal carcinoma in situ of left breast: Secondary | ICD-10-CM | POA: Diagnosis not present

## 2021-12-16 DIAGNOSIS — D0511 Intraductal carcinoma in situ of right breast: Secondary | ICD-10-CM | POA: Insufficient documentation

## 2021-12-16 DIAGNOSIS — Z853 Personal history of malignant neoplasm of breast: Secondary | ICD-10-CM | POA: Insufficient documentation

## 2021-12-16 LAB — COMPREHENSIVE METABOLIC PANEL
ALT: 17 U/L (ref 0–44)
AST: 21 U/L (ref 15–41)
Albumin: 4.1 g/dL (ref 3.5–5.0)
Alkaline Phosphatase: 100 U/L (ref 38–126)
Anion gap: 7 (ref 5–15)
BUN: 45 mg/dL — ABNORMAL HIGH (ref 8–23)
CO2: 23 mmol/L (ref 22–32)
Calcium: 8.9 mg/dL (ref 8.9–10.3)
Chloride: 109 mmol/L (ref 98–111)
Creatinine, Ser: 2.56 mg/dL — ABNORMAL HIGH (ref 0.44–1.00)
GFR, Estimated: 18 mL/min — ABNORMAL LOW (ref >60)
Glucose, Bld: 130 mg/dL — ABNORMAL HIGH (ref 70–99)
Potassium: 5 mmol/L (ref 3.5–5.1)
Sodium: 139 mmol/L (ref 135–145)
Total Bilirubin: 0.3 mg/dL (ref 0.3–1.2)
Total Protein: 7.8 g/dL (ref 6.5–8.1)

## 2021-12-16 LAB — CBC WITH DIFFERENTIAL/PLATELET
Abs Immature Granulocytes: 0.01 10*3/uL (ref 0.00–0.07)
Basophils Absolute: 0.1 10*3/uL (ref 0.0–0.1)
Basophils Relative: 1 %
Eosinophils Absolute: 0.2 10*3/uL (ref 0.0–0.5)
Eosinophils Relative: 3 %
HCT: 35.3 % — ABNORMAL LOW (ref 36.0–46.0)
Hemoglobin: 11.2 g/dL — ABNORMAL LOW (ref 12.0–15.0)
Immature Granulocytes: 0 %
Lymphocytes Relative: 32 %
Lymphs Abs: 2 10*3/uL (ref 0.7–4.0)
MCH: 29.1 pg (ref 26.0–34.0)
MCHC: 31.7 g/dL (ref 30.0–36.0)
MCV: 91.7 fL (ref 80.0–100.0)
Monocytes Absolute: 1 10*3/uL (ref 0.1–1.0)
Monocytes Relative: 16 %
Neutro Abs: 2.9 10*3/uL (ref 1.7–7.7)
Neutrophils Relative %: 48 %
Platelets: 269 10*3/uL (ref 150–400)
RBC: 3.85 MIL/uL — ABNORMAL LOW (ref 3.87–5.11)
RDW: 15.9 % — ABNORMAL HIGH (ref 11.5–15.5)
WBC: 6.2 10*3/uL (ref 4.0–10.5)
nRBC: 0 % (ref 0.0–0.2)

## 2021-12-16 NOTE — Progress Notes (Signed)
Hematology/Oncology Progress note [phone] [phone]            Patient Care Team: Juluis Pitch, MD as PCP - General (Family Medicine) Theodore Demark, RN (Inactive) as Oncology Nurse Navigator Earlie Server, MD as Consulting Physician (Oncology) Noreene Filbert, MD as Consulting Physician (Radiation Oncology) Gabriel Carina Betsey Holiday, MD as Consulting Physician (Endocrinology) Bary Castilla, Forest Gleason, MD as Consulting Physician (General Surgery)  ASSESSMENT & PLAN:   Cancer Staging  DCIS (ductal carcinoma in situ) of breast Staging form: Breast, AJCC 7th Edition - Clinical: Stage 0 (Tis (DCIS), N0, M0) - Signed by Earlie Server, MD on 05/03/2021   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 Labs reviewed and discussed with patient Continue Arimidex 1 mg daily. Recommend calcium and vitamin D supplementation. Recommend DEXA every 2 years. Patient prefers her PCP to order.   I recommend genetic testing. 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  Right breast lesion 39m, proceed with [facility] biopsy   Orders Placed This Encounter  Procedures   CBC with Differential/Platelet    Standing Status:   Future    Standing Expiration Date:   12/17/2022   Comprehensive metabolic panel    Standing Status:   Future    Standing Expiration Date:   12/16/2022   Follow up in 6 months, or earlier if her right breast biopsy is positive All questions were answered. The patient knows to call the clinic with any problems, questions or concerns.  ZEarlie Server MD, PhD [facility] H[facility] Oncology 12/16/2021   CHIEF COMPLAINTS/REASON FOR VISIT:  Follow up breast cancer  HISTORY OF PRESENTING ILLNESS:   Alexis RICEis  a  81y.o.  female with PMH listed below was seen in consultation at the request of  BJuluis Pitch MD  for evaluation of breast cancer  Patient has a history of breast cancer in 2016.  Managed by surgery and radiation oncology. 11/17/2014, patient underwent right breast biopsy which showed invasive mammary carcinoma, grade 3, tumor spans up to 5 mm no special type, ER negative, PR negative, HER2 IHC equivocal 2+, FISH negative. Patient underwent right breast lumpectomy and sentinel lymph node biopsy.  No residual malignancy found on her lumpectomy specimen.  Sclerosing adenosis with apocrine metaplasia.  Stromal hemorrhage. She received radiation adjuvantly.  12/13/2020, bilateral breast screening mammogram showed left breast calcification warrants further evaluation.  Right breast no findings suspicious for malignancy. 12/20/2020, diagnostic left mammogram showed left breast upper outer quadrant indeterminate group of calcification. 12/27/2020, left breast upper outer stereotactic biopsy showed high-grade DCIS, comedo type with associated calcifications. 02/04/2021, patient had left breast lumpectomy which showed focal residual DCIS, low-grade.  No evidence of invasive carcinoma.  All margins were negative for DCIS.  pTis,pNx, estrogen receptor positive,> 90%  Patient was seen by radiation oncology Dr. CHuey Bienenstockand she received adjuvant radiation. Patient was referred to establish care with oncology for evaluation.  Patient was accompanied by daughter.  Patient has a history of arthritis, asthma, CKD, diabetes, GERD, hypertension and obesity.  Family history is negative for breast cancer.  Positive for lung cancer in sister.   INTERVAL HISTORY Alexis WEILLis a 81y.o. female who has above history reviewed by me today presents  for follow up visit for left breast high-grade DCIS [2022], right breast invasive carcinoma [2016] Patient reports feeling well.  She takes Arimidex 1 mg daily.   Overall tolerates well with some side effects.  Chronic joint pain. Recent mammogram showed suspicious right breast lesion, she has biopsy scheduled on 12/27/21  Review of Systems  Constitutional:  Positive for fatigue. Negative for appetite change, chills and fever.  HENT:   Negative for hearing loss and voice change.   Eyes:  Negative for eye problems.  Respiratory:  Negative for chest tightness and cough.   Cardiovascular:  Negative for chest pain.  Gastrointestinal:  Negative for abdominal distention, abdominal pain and blood in stool.  Endocrine: Negative for hot flashes.  Genitourinary:  Negative for difficulty urinating and frequency.   Musculoskeletal:  Positive for arthralgias.  Skin:  Negative for itching and rash.  Neurological:  Negative for extremity weakness.  Hematological:  Negative for adenopathy.  Psychiatric/Behavioral:  Negative for confusion.     MEDICAL HISTORY:  Past Medical History:  Diagnosis Date   Anemia    Arthritis    Asthma    B12 deficiency    Breast cancer ([place]) 11/17/2014   right breast lumpectomy, triple negative, with mammosite tx. Chemo with held due to CVA after lumpectomy   CKD (chronic kidney disease), stage IV (HCC)    Complication of anesthesia    had a stroke during the breast lumpectomy   Diabetes mellitus without complication (HCC)    Dyspnea    GERD (gastroesophageal reflux disease)    Gout    History of hiatal hernia    Hyperlipidemia    Hyperparathyroidism ([place])    Hypertension    Malignant neoplasm ([place])    Morbid obesity ([place])    Personal history of radiation therapy 2016    mammosite radiation-RIGHT lumpectomy   Pneumonia    Stroke ([place]) 2016   post lumpectomy   Vitamin D deficiency     SURGICAL HISTORY: Past Surgical History:  Procedure Laterality Date   ABDOMINAL HYSTERECTOMY  1979   BREAST BIOPSY Right 11/17/2014   Invasive mammory carcinoma   BREAST BIOPSY Right 12/01/2014   Procedure: BREAST BIOPSY WITH  NEEDLE LOCALIZATION;  Surgeon: Christene Lye, MD;  Location: [facility] ORS;  Service: General;  Laterality: Right;   BREAST BIOPSY Left 12/27/2020   Affirm bx-asymmetry/calcs "Ribbon" clip-path pending   BREAST LUMPECTOMY Right 2016   INVASIVE MAMMARY CARCINOMA    BREAST LUMPECTOMY WITH SENTINEL LYMPH NODE BIOPSY Right 12/01/2014   Procedure: BREAST LUMPECTOMY WITH SENTINEL LYMPH NODE BX;  Surgeon: Christene Lye, MD;  Location: [facility] ORS;  Service: General;  Laterality: Right;   CHOLECYSTECTOMY  2020   COLONOSCOPY WITH PROPOFOL N/A 06/13/2016   Procedure: COLONOSCOPY WITH PROPOFOL;  Surgeon: Lollie Sails, MD;  Location: [facility] ENDOSCOPY;  Service: Endoscopy;  Laterality: N/A;   COLONOSCOPY WITH PROPOFOL N/A 12/03/2019   Procedure: COLONOSCOPY WITH PROPOFOL;  Surgeon: Toledo, Benay Pike, MD;  Location: [facility] ENDOSCOPY;  Service: Gastroenterology;  Laterality: N/A;   EYE SURGERY Bilateral    CATARACT REMOVAL   LOWER EXTREMITY INTERVENTION Left 11/09/2016   Procedure: LOWER EXTREMITY INTERVENTION;  Surgeon: Algernon Huxley, MD;  Location: [facility] CV LAB;  Service: Cardiovascular;  Laterality: Left;   MASTECTOMY Right    lumpectomy    OOPHORECTOMY  2014   PARATHYROIDECTOMY N/A 07/14/2020   Procedure: PARATHYROIDECTOMY, RNFA to assist;  Surgeon: Fredirick Maudlin, MD;  Location: [facility] ORS;  Service: General;  Laterality:  N/A;   PARTIAL MASTECTOMY WITH NEEDLE LOCALIZATION Left 02/04/2021   Procedure: PARTIAL MASTECTOMY WITH NEEDLE LOCALIZATION;  Surgeon: Herbert Pun, MD;  Location: [facility] ORS;  Service: General;  Laterality: Left;   TONSILLECTOMY      SOCIAL HISTORY: Social History   Socioeconomic History   Marital status: Divorced    Spouse name: Not on file   Number of children: 2   Years of education: 12   Highest education level: High school graduate  Occupational History   Not on file  Tobacco Use   Smoking status: Former    Types: Cigarettes    Quit date:  08/25/1993    Years since quitting: 28.3   Smokeless tobacco: Never  Vaping Use   Vaping Use: Never used  Substance and Sexual Activity   Alcohol use: No    Alcohol/week: 0.0 standard drinks of alcohol   Drug use: No   Sexual activity: Not Currently  Other Topics Concern   Not on file  Social History Narrative   Patient is a Sales promotion account executive Witness   Social Determinants of Health   Financial Resource Strain: Low Risk  (10/30/2018)   Overall Financial Resource Strain (CARDIA)    Difficulty of Paying Living Expenses: Not hard at all  Food Insecurity: No Food Insecurity (10/26/2021)   Hunger Vital Sign    Worried About Running Out of Food in the Last Year: Never true    Ran Out of Food in the Last Year: Never true  Transportation Needs: No Transportation Needs (10/26/2021)   PRAPARE - Hydrologist (Medical): No    Lack of Transportation (Non-Medical): No  Physical Activity: Inactive (10/30/2018)   Exercise Vital Sign    Days of Exercise per Week: 0 days    Minutes of Exercise per Session: 0 min  Stress: No Stress Concern Present (10/30/2018)   [place]    Feeling of Stress : Only a little  Social Connections: Moderately Integrated (10/30/2018)   Social Connection and Isolation Panel [NHANES]    Frequency of Communication with Friends and Family: More than three times a week    Frequency of Social Gatherings with Friends and Family: More than three times a week    Attends Religious Services: More than 4 times per year    Active Member of [employer] or Organizations: Yes    Attends Music therapist: More than 4 times per year    Marital Status: Divorced  Intimate Partner Violence: Not At Risk (10/30/2018)   Humiliation, Afraid, Rape, and Kick questionnaire    Fear of Current or Ex-Partner: No    Emotionally Abused: No    Physically Abused: No    Sexually Abused: No    FAMILY  HISTORY: Family History  Problem Relation Age of Onset   CAD Father    Lung cancer Sister        smoker   Thyroid disease Daughter    Thyroid disease Daughter    Breast cancer Neg Hx     ALLERGIES:  is allergic to oxycodone.  MEDICATIONS:  Current Outpatient Medications  Medication Sig Dispense Refill   ACCU-CHEK AVIVA PLUS test strip      acetaminophen (TYLENOL) 325 MG tablet Take 650 mg by mouth every 6 (six) hours as needed for moderate pain or headache.     allopurinol (ZYLOPRIM) 100 MG tablet Take 100 mg by mouth 2 (two) times daily.  amLODipine (NORVASC) 10 MG tablet Take 10 mg by mouth daily.     anastrozole (ARIMIDEX) 1 MG tablet Take 1 tablet (1 mg total) by mouth daily. 90 tablet 2   aspirin EC 81 MG tablet Take 81 mg by mouth daily.     atorvastatin (LIPITOR) 40 MG tablet Take 40 mg by mouth daily at 6 PM.     Cholecalciferol (VITAMIN D) 125 MCG (5000 UT) CAPS Take 5,000 Units by mouth daily.     diphenhydrAMINE (BENADRYL) 25 MG tablet Take 50 mg by mouth every 6 (six) hours as needed for allergies.     DROPLET PEN NEEDLES 31G X 5 MM MISC      ezetimibe (ZETIA) 10 MG tablet Take 10 mg by mouth daily.     Insulin Isophane & Regular Human (NOVOLIN 70/30 FLEXPEN) (70-30) 100 UNIT/ML PEN Inject 5 Units into the skin 2 (two) times daily. (Patient taking differently: Inject 22-38 Units into the skin See admin instructions. Inject 22 units in the morning with breakfast and 38 units in the evening with dinner (half the dose if blood sugar is under 70)) 15 mL 11   losartan (COZAAR) 25 MG tablet Take 25 mg by mouth daily.      magnesium oxide (MAG-OX) 400 MG tablet Take 800 mg by mouth 2 (two) times daily.     metoprolol succinate (TOPROL-XL) 50 MG 24 hr tablet Take 50 mg by mouth at bedtime.     Multiple Vitamins-Minerals (MULTIVITAMIN WITH MINERALS) tablet Take 1 tablet by mouth daily.     omeprazole (PRILOSEC) 20 MG capsule Take 20 mg by mouth 2 (two) times daily.      torsemide (DEMADEX) 20 MG tablet TAKE 1 TABLET BY MOUTH ONCE DAILY AS DIRECTED FOR LEG EDEMA     vitamin B-12 (CYANOCOBALAMIN) 1000 MCG tablet Take 1,000 mcg by mouth daily.     vitamin E 400 UNIT capsule Take 400 Units by mouth daily.      No current facility-administered medications for this visit.     PHYSICAL EXAMINATION: ECOG PERFORMANCE STATUS: 1 - Symptomatic but completely ambulatory Vitals:   12/16/21 1016  BP: (!) 148/64  Pulse: 86  Resp: 18  Temp: 98.2 F (36.8 C)   Filed Weights   12/16/21 1016  Weight: 235 lb (106.6 kg)    Physical Exam Constitutional:      General: She is not in acute distress.    Appearance: She is obese.  HENT:     Head: Normocephalic and atraumatic.  Eyes:     General: No scleral icterus. Cardiovascular:     Rate and Rhythm: Normal rate and regular rhythm.     Heart sounds: Normal heart sounds.  Pulmonary:     Effort: Pulmonary effort is normal. No respiratory distress.     Breath sounds: No wheezing.  Abdominal:     General: Bowel sounds are normal. There is no distension.     Palpations: Abdomen is soft.  Musculoskeletal:        General: No deformity. Normal range of motion.     Cervical back: Normal range of motion and neck supple.  Skin:    General: Skin is warm and dry.     Findings: No erythema or rash.  Neurological:     Mental Status: She is alert and oriented to person, place, and time. Mental status is at baseline.     Cranial Nerves: No cranial nerve deficit.     Coordination: Coordination normal.  Psychiatric:        Mood and Affect: Mood normal.   Breast exam was performed in seated and lying down position. No palpable breast masses bilaterally. No palpable axillary adenopathy bilaterally.   LABORATORY DATA:  I have reviewed the data as listed    Latest Ref Rng & Units 12/16/2021   10:01 AM 06/17/2021    9:28 AM 05/03/2021   11:05 AM  CBC  WBC 4.0 - 10.5 K/uL 6.2  5.2  4.3   Hemoglobin 12.0 - 15.0 g/dL 11.2   11.3  11.5   Hematocrit 36.0 - 46.0 % 35.3  34.8  35.8   Platelets 150 - 400 K/uL 269  245  241       Latest Ref Rng & Units 12/16/2021   10:01 AM 06/17/2021    9:28 AM 05/03/2021   11:05 AM  CMP  Glucose 70 - 99 mg/dL 130  167  135   BUN 8 - 23 mg/dL 45  50  35   Creatinine 0.44 - 1.00 mg/dL 2.56  2.15  2.01   Sodium 135 - 145 mmol/L 139  136  137   Potassium 3.5 - 5.1 mmol/L 5.0  4.8  4.5   Chloride 98 - 111 mmol/L 109  109  108   CO2 22 - 32 mmol/L '23  21  19   ' Calcium 8.9 - 10.3 mg/dL 8.9  8.9  9.1   Total Protein 6.5 - 8.1 g/dL 7.8  7.3  7.3   Total Bilirubin 0.3 - 1.2 mg/dL 0.3  0.4  0.2   Alkaline Phos 38 - 126 U/L 100  96  99   AST 15 - 41 U/L '21  22  22   ' ALT 0 - 44 U/L '17  17  20     ' RADIOGRAPHIC STUDIES: I have personally reviewed the radiological images as listed and agreed with the findings in the report. MM DIAG BREAST TOMO BILATERAL  Result Date: 12/09/2021 CLINICAL DATA:  81 year old female presenting for routine annual surveillance status post left breast lumpectomy in 2022. She also had a right breast lumpectomy in 2016. EXAM: DIGITAL DIAGNOSTIC BILATERAL MAMMOGRAM WITH TOMOSYNTHESIS; ULTRASOUND RIGHT BREAST LIMITED TECHNIQUE: Bilateral digital diagnostic mammography and breast tomosynthesis was performed.; Targeted ultrasound examination of the right breast was performed COMPARISON:  Previous exam(s). ACR Breast Density Category b: There are scattered areas of fibroglandular density. FINDINGS: In the left breast at the lumpectomy site in the lateral posterior breast, there is a seroma measuring approximately 3.6 cm. Otherwise expected surgical changes noted at the surgical site. At the lumpectomy site in the superior right breast, there is ill-defined increased density compared with prior mammograms. No other suspicious calcifications, masses or areas of distortion are seen in the bilateral breasts. Ultrasound targeted to the right breast at 12 o'clock, 12 cm from the  nipple demonstrates a few small oil cysts, which is adjacent to a suspicious hypoechoic mass measuring 9 x 6 x 7 mm. Ultrasound of the right axilla demonstrates multiple normal-appearing lymph nodes. IMPRESSION: 1. There is a suspicious 9 mm mass at the lumpectomy site in the right breast at 12 o'clock. 2.  No evidence of right axillary lymphadenopathy. 3. Surgical changes at the lumpectomy site in the lateral left breast including a 3.6 cm seroma. No evidence of left breast malignancy. RECOMMENDATION: Ultrasound guided biopsy is recommended for the right breast mass. We will schedule the patient at her earliest convenience. I have discussed the findings and recommendations  with the patient. If applicable, a reminder letter will be sent to the patient regarding the next appointment. BI-RADS CATEGORY  4: Suspicious. Electronically Signed   By: Ammie Ferrier M.D.   On: 12/09/2021 10:48  US BREAST LTD UNI RIGHT INC AXILLA  Result Date: 12/09/2021 CLINICAL DATA:  81 year old female presenting for routine annual surveillance status post left breast lumpectomy in 2022. She also had a right breast lumpectomy in 2016. EXAM: DIGITAL DIAGNOSTIC BILATERAL MAMMOGRAM WITH TOMOSYNTHESIS; ULTRASOUND RIGHT BREAST LIMITED TECHNIQUE: Bilateral digital diagnostic mammography and breast tomosynthesis was performed.; Targeted ultrasound examination of the right breast was performed COMPARISON:  Previous exam(s). ACR Breast Density Category b: There are scattered areas of fibroglandular density. FINDINGS: In the left breast at the lumpectomy site in the lateral posterior breast, there is a seroma measuring approximately 3.6 cm. Otherwise expected surgical changes noted at the surgical site. At the lumpectomy site in the superior right breast, there is ill-defined increased density compared with prior mammograms. No other suspicious calcifications, masses or areas of distortion are seen in the bilateral breasts. Ultrasound  targeted to the right breast at 12 o'clock, 12 cm from the nipple demonstrates a few small oil cysts, which is adjacent to a suspicious hypoechoic mass measuring 9 x 6 x 7 mm. Ultrasound of the right axilla demonstrates multiple normal-appearing lymph nodes. IMPRESSION: 1. There is a suspicious 9 mm mass at the lumpectomy site in the right breast at 12 o'clock. 2.  No evidence of right axillary lymphadenopathy. 3. Surgical changes at the lumpectomy site in the lateral left breast including a 3.6 cm seroma. No evidence of left breast malignancy. RECOMMENDATION: Ultrasound guided biopsy is recommended for the right breast mass. We will schedule the patient at her earliest convenience. I have discussed the findings and recommendations with the patient. If applicable, a reminder letter will be sent to the patient regarding the next appointment. BI-RADS CATEGORY  4: Suspicious. Electronically Signed   By: Ammie Ferrier M.D.   On: 12/09/2021 10:48

## 2021-12-16 NOTE — Assessment & Plan Note (Addendum)
Left DCIS ER+,  S/p lumpectomy [12/27/2020] and adjuvant radiation. Labs reviewed and discussed with patient Continue Arimidex 1 mg daily. Recommend calcium and vitamin D supplementation. Recommend DEXA every 2 years. Patient prefers her PCP to order.   I recommend genetic testing. Patient is undecided and she will update me if she decides to proceed

## 2021-12-16 NOTE — Assessment & Plan Note (Addendum)
11/17/14 right breast, invasive carcinoma, T1a N0  ER-, PR-, HER2 2+, FISH - Continue imaging surveillance  Continue mammogram surveillance.  Right breast lesion 41m, proceed with [place]oreaguided biopsy

## 2021-12-27 ENCOUNTER — Ambulatory Visit
Admission: RE | Admit: 2021-12-27 | Discharge: 2021-12-27 | Disposition: A | Discharge status: Home / Self Care | Payer: Medicare HMO | Source: Ambulatory Visit | Attending: Oncology | Admitting: Oncology

## 2021-12-27 DIAGNOSIS — N63 Unspecified lump in unspecified breast: Secondary | ICD-10-CM | POA: Insufficient documentation

## 2021-12-27 DIAGNOSIS — R928 Other abnormal and inconclusive findings on diagnostic imaging of breast: Secondary | ICD-10-CM | POA: Insufficient documentation

## 2021-12-27 DIAGNOSIS — N6311 Unspecified lump in the right breast, upper outer quadrant: Secondary | ICD-10-CM | POA: Diagnosis not present

## 2021-12-27 DIAGNOSIS — N641 Fat necrosis of breast: Secondary | ICD-10-CM | POA: Diagnosis not present

## 2021-12-27 HISTORY — PX: BREAST BIOPSY: SHX20

## 2021-12-28 LAB — SURGICAL PATHOLOGY

## 2021-12-30 DIAGNOSIS — Z853 Personal history of malignant neoplasm of breast: Secondary | ICD-10-CM | POA: Diagnosis not present

## 2021-12-30 DIAGNOSIS — I10 Essential (primary) hypertension: Secondary | ICD-10-CM | POA: Diagnosis not present

## 2022-01-10 DIAGNOSIS — Z853 Personal history of malignant neoplasm of breast: Secondary | ICD-10-CM | POA: Diagnosis not present

## 2022-01-10 DIAGNOSIS — Z87891 Personal history of nicotine dependence: Secondary | ICD-10-CM | POA: Diagnosis not present

## 2022-01-10 DIAGNOSIS — N1832 Chronic kidney disease, stage 3b: Secondary | ICD-10-CM | POA: Diagnosis not present

## 2022-01-10 DIAGNOSIS — E042 Nontoxic multinodular goiter: Secondary | ICD-10-CM | POA: Diagnosis not present

## 2022-01-10 DIAGNOSIS — I129 Hypertensive chronic kidney disease with stage 1 through stage 4 chronic kidney disease, or unspecified chronic kidney disease: Secondary | ICD-10-CM | POA: Diagnosis not present

## 2022-01-10 DIAGNOSIS — Z794 Long term (current) use of insulin: Secondary | ICD-10-CM | POA: Diagnosis not present

## 2022-01-10 DIAGNOSIS — E1122 Type 2 diabetes mellitus with diabetic chronic kidney disease: Secondary | ICD-10-CM | POA: Diagnosis not present

## 2022-01-10 DIAGNOSIS — D631 Anemia in chronic kidney disease: Secondary | ICD-10-CM | POA: Diagnosis not present

## 2022-01-10 DIAGNOSIS — E782 Mixed hyperlipidemia: Secondary | ICD-10-CM | POA: Diagnosis not present

## 2022-01-19 ENCOUNTER — Other Ambulatory Visit: Payer: Self-pay | Admitting: General Surgery

## 2022-01-19 DIAGNOSIS — K432 Incisional hernia without obstruction or gangrene: Secondary | ICD-10-CM | POA: Diagnosis not present

## 2022-01-23 ENCOUNTER — Ambulatory Visit: Payer: Medicare HMO

## 2022-01-26 ENCOUNTER — Ambulatory Visit
Admission: RE | Admit: 2022-01-26 | Discharge: 2022-01-26 | Disposition: A | Discharge status: Home / Self Care | Payer: Medicare HMO | Source: Ambulatory Visit | Attending: General Surgery | Admitting: General Surgery

## 2022-01-26 DIAGNOSIS — K432 Incisional hernia without obstruction or gangrene: Secondary | ICD-10-CM | POA: Insufficient documentation

## 2022-01-26 DIAGNOSIS — N289 Disorder of kidney and ureter, unspecified: Secondary | ICD-10-CM | POA: Diagnosis not present

## 2022-01-26 DIAGNOSIS — K5730 Diverticulosis of large intestine without perforation or abscess without bleeding: Secondary | ICD-10-CM | POA: Diagnosis not present

## 2022-02-15 DIAGNOSIS — E042 Nontoxic multinodular goiter: Secondary | ICD-10-CM | POA: Diagnosis not present

## 2022-02-15 DIAGNOSIS — N1832 Chronic kidney disease, stage 3b: Secondary | ICD-10-CM | POA: Diagnosis not present

## 2022-02-15 DIAGNOSIS — C50211 Malignant neoplasm of upper-inner quadrant of right female breast: Secondary | ICD-10-CM | POA: Diagnosis not present

## 2022-02-15 DIAGNOSIS — Z794 Long term (current) use of insulin: Secondary | ICD-10-CM | POA: Diagnosis not present

## 2022-02-15 DIAGNOSIS — E782 Mixed hyperlipidemia: Secondary | ICD-10-CM | POA: Diagnosis not present

## 2022-02-15 DIAGNOSIS — E538 Deficiency of other specified B group vitamins: Secondary | ICD-10-CM | POA: Diagnosis not present

## 2022-02-15 DIAGNOSIS — M1A9XX0 Chronic gout, unspecified, without tophus (tophi): Secondary | ICD-10-CM | POA: Diagnosis not present

## 2022-02-15 DIAGNOSIS — I10 Essential (primary) hypertension: Secondary | ICD-10-CM | POA: Diagnosis not present

## 2022-02-15 DIAGNOSIS — E1122 Type 2 diabetes mellitus with diabetic chronic kidney disease: Secondary | ICD-10-CM | POA: Diagnosis not present

## 2022-02-23 ENCOUNTER — Ambulatory Visit: Payer: Self-pay | Admitting: General Surgery

## 2022-02-23 DIAGNOSIS — E1122 Type 2 diabetes mellitus with diabetic chronic kidney disease: Secondary | ICD-10-CM | POA: Diagnosis not present

## 2022-02-23 DIAGNOSIS — Z794 Long term (current) use of insulin: Secondary | ICD-10-CM | POA: Diagnosis not present

## 2022-02-23 DIAGNOSIS — R809 Proteinuria, unspecified: Secondary | ICD-10-CM | POA: Diagnosis not present

## 2022-02-23 DIAGNOSIS — Z79811 Long term (current) use of aromatase inhibitors: Secondary | ICD-10-CM | POA: Diagnosis not present

## 2022-02-23 DIAGNOSIS — N184 Chronic kidney disease, stage 4 (severe): Secondary | ICD-10-CM | POA: Diagnosis not present

## 2022-02-23 DIAGNOSIS — K432 Incisional hernia without obstruction or gangrene: Secondary | ICD-10-CM | POA: Diagnosis not present

## 2022-02-23 DIAGNOSIS — E1129 Type 2 diabetes mellitus with other diabetic kidney complication: Secondary | ICD-10-CM | POA: Diagnosis not present

## 2022-02-23 DIAGNOSIS — I10 Essential (primary) hypertension: Secondary | ICD-10-CM | POA: Diagnosis not present

## 2022-02-23 DIAGNOSIS — Z780 Asymptomatic menopausal state: Secondary | ICD-10-CM | POA: Diagnosis not present

## 2022-02-23 DIAGNOSIS — N2581 Secondary hyperparathyroidism of renal origin: Secondary | ICD-10-CM | POA: Diagnosis not present

## 2022-02-23 NOTE — H&P (View-Only) (Signed)
HISTORY OF PRESENT ILLNESS:    Alexis Greer is a 81 y.o.female patient who comes for discussion of incisional hernia repair.  Patient with history of cholecystectomy now with incisional hernia in the supraumbilical wound.  This has been growing slowly.  Now is getting very tender on palpation and getting more difficult to reduce.  I was unable to reduce the hernia at bedside today due to pain.  She denies any episode of abdominal distention, nausea or vomiting.  Pain is localized to the anterior mid abdomen.  No radiation.  Aggravating factors applying pressure.  No elevating factors.  I ordered a CT scan for evaluation of complex hernia.  This showed a 6 x 6 cm incisional hernia.  There is small bowel component and the hernia content.  No sign of obstruction.  I personally evaluated the images.      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, DCIS s/p surgery and radiation   Cataract cortical, senile    Complication of anesthesia    Dyspnea    GERD (gastroesophageal reflux disease)    History of gout    History of radiation therapy 2016   History of radiation therapy 2022   left breast mammosite placed 03-08-21   Hypercalcemia and hyperparathyroidism    Resolved, s/p right neck inferior parathyroid adenoma (surgeon Dr Celine Ahr, [facility]) on 07/14/2020   Hyperlipidemia    Hypertension    Pneumonia    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, Mammosite radiation; s/p right breast lumpectomy and SLNBx, s/p radiation therapy   Stage 3 chronic kidney disease (CMS-HCC)    Stroke (CMS-HCC) 2016   Tubular adenoma of colon 06/13/2016   Type 2 diabetes mellitus (CMS-HCC)    Vitamin D deficiency         PAST SURGICAL HISTORY:   Past Surgical History:   Procedure Laterality Date   HYSTERECTOMY  1979   OOPHORECTOMY  2014   history of breast biopsy Right 11/17/2014   MASTECTOMY PARTIAL / LUMPECTOMY Right 12/01/2014   Breast Cancer   COLONOSCOPY  06/13/2016   Tubular adenoma of colon/Repeat 6yr/MUS   lower extremity intervention Left 11/09/2016   Dr. DLucky Cowboy  CHOLECYSTECTOMY  2020   COLONOSCOPY  12/03/2019   Tubular adenomas/PHx CP/No Repeat due to age/TKT   PARATHYROIDECTOMY  07/14/2020   affirm breast biopsy Left 12/27/2020   MASTECTOMY PARTIAL Left 02/04/2021   Dr ELesli Albeew/ [place]   Mammosite Left 03/08/2021   Treatment left breast DCIS   CATARACT EXTRACTION Bilateral    TONSILLECTOMY           MEDICATIONS:  Outpatient Encounter Medications as of 02/23/2022  Medication Sig Dispense Refill   ACCU-CHEK AVIVA PLUS TEST STRP test strip TEST BLOOD SUGAR TWICE DAILY AS DIRECTED 200 strip 3   acetaminophen (TYLENOL) 650 MG ER tablet Take 1 tablet every 4-6 hours as needed for pain 30 tablet 0   allopurinoL (ZYLOPRIM) 100 MG tablet TAKE 1 TABLET TWICE DAILY 180 tablet 3   amLODIPine (NORVASC) 10 MG tablet TAKE 1 TABLET EVERY DAY 90 tablet 3   anastrozole (ARIMIDEX) 1 mg tablet Take by mouth     aspirin 81 MG  EC tablet Take 81 mg by mouth once daily.     atorvastatin (LIPITOR) 40 MG tablet TAKE 1 TABLET EVERY NIGHT 90 tablet 3   cholecalciferol, vitamin D3, (VITAMIN D3) 125 mcg (5,000 unit) tablet 1 tablet daily - dose unknown       diphenhydrAMINE (BENADRYL) 50 MG capsule Take by mouth every 6 (six) hours as needed     DROPLET PEN NEEDLE 31 gauge x 3/16" needle USE TWICE DAILY 200 each 2   ezetimibe (ZETIA) 10 mg tablet TAKE 1 TABLET EVERY DAY 90 tablet 3   insulin NPH hum/reg insulin hm (NOVOLIN 70-30 FLEXPEN U-100 SUBQ) Inject subcutaneously Inject 22 units with breakfast and 38 units before dinner     losartan (COZAAR) 25 MG tablet Take 2 tablets (50 mg total) by mouth once daily 180 tablet 1   magnesium oxide (MAG-OX) 400 mg  (241.3 mg magnesium) tablet TAKE 1 TABLET TWICE DAILY 180 tablet 1   metoprolol succinate (TOPROL-XL) 50 MG XL tablet Take 2 tablets (100 mg total) by mouth once daily 180 tablet 1   multivitamin tablet Take 1 tablet by mouth once daily.     omeprazole (PRILOSEC) 20 MG DR capsule Take 2 capsules (40 mg total) by mouth once daily 180 capsule 3   pen needle, diabetic (BD ULTRA-FINE MINI PEN NEEDLE) 31 gauge x 3/16" needle 2 (two) times daily 200 each 1   TORsemide (DEMADEX) 20 MG tablet TAKE 1 TABLET BY MOUTH ONCE DAILY AS DIRECTED FOR LEG EDEMA     VITAMIN B-12 1,000 mcg SL tablet Place 1,000 mcg under the tongue once daily       vitamin E 400 UNIT capsule Take 1 capsule by mouth once daily       No facility-administered encounter medications on file as of 02/23/2022.     ALLERGIES:   Oxycodone   SOCIAL HISTORY:  Social History   Socioeconomic History   Marital status: Single  Tobacco Use   Smoking status: Former    Types: Cigarettes    Quit date: 08/25/1993    Years since quitting: 28.5   Smokeless tobacco: Never  Substance and Sexual Activity   Alcohol use: No   Drug use: No   Sexual activity: Never  Social History Narrative   Prevnar: 2018    FAMILY HISTORY:  Family History  Problem Relation Age of Onset   Heart disease Mother    High blood pressure (Hypertension) Mother    Hyperlipidemia (Elevated cholesterol) Father    Coronary Artery Disease (Blocked arteries around heart) Father    Lung cancer Sister    Diabetes type II Brother    Colon cancer Brother    Thyroid disease Daughter    Breast cancer Neg Hx      GENERAL REVIEW OF SYSTEMS:   General ROS: negative for - chills, fatigue, fever, weight gain or weight loss Allergy and Immunology ROS: negative for - hives  Hematological and Lymphatic ROS: negative for - bleeding problems or bruising, negative for palpable nodes Endocrine ROS: negative for - heat or cold intolerance, hair changes Respiratory ROS:  negative for - cough, shortness of breath or wheezing Cardiovascular ROS: no chest pain or palpitations GI ROS: negative for nausea, vomiting, abdominal pain, diarrhea, constipation Musculoskeletal ROS: negative for - joint swelling or muscle pain Neurological ROS: negative for - confusion, syncope Dermatological ROS: negative for pruritus and rash  PHYSICAL EXAM:  Vitals:   02/23/22 0742  BP: (!) 173/77  Pulse: 73  .  Ht:157.5 cm ('5\' 2"'$ ) Wt:(!) 107 kg (236 lb) MOQ:HUTM surface area is 2.16 meters squared. Body mass index is 43.16 kg/m.Marland Kitchen   GENERAL: Alert, active, oriented x3  HEENT: Pupils equal reactive to light. Extraocular movements are intact. Sclera clear. Palpebral conjunctiva normal red color.Pharynx clear.  NECK: Supple with no palpable mass and no adenopathy.  LUNGS: Sound clear with no rales rhonchi or wheezes.  HEART: Regular rhythm S1 and S2 without murmur.  ABDOMEN: Soft and depressible, nontender with no palpable mass, no hepatomegaly. Large ventral hernia unable to be completely reduced, tender to palpation.  EXTREMITIES: Well-developed well-nourished symmetrical with no dependent edema.  NEUROLOGICAL: Awake alert oriented, facial expression symmetrical, moving all extremities.      IMPRESSION:     Incisional hernia, without obstruction or gangrene [K43.2]  Patient again oriented about the CT scan finding.  Then about the moderate sized incisional hernia.  The fact that she has been having worsening symptoms and now unable to completely reduce the hernia and is tender to palpation I think that is reasonable to consider either incisional hernia repair.  I discussed with patient that her BMI of 43 making her more prone for recurrence and complications but I do not foresee this patient losing any weight due to her inactivity.  Discussed with patient the importance of healthy diet.  She is unable to walk long distances or doing exercise due to severe knee pain.  I  discussed with patient surgical approach of minimally invasive hernia repair versus open repair.  Discussed with patient the use of mesh.  I discussed with patient the risks of surgery that includes bleeding, infection, injury to adjacent organs such as small bowel, bladder, liver, among others.  Discussed with patient risks of intestinal fistula or complication with prosthesis.  The patient reports she understood this risk and agreed to proceed with surgery.          PLAN:  Robotic assisted laparoscopic incisional hernia repair with mesh (54650) CBC, CMP done 02/15/22 Hold aspirin 5 days before surgery Contact us if you have any concern.   Patient verbalized understanding, all questions were answered, and were agreeable with the plan outlined above.   Herbert Pun, MD  Electronically signed by Herbert Pun, MD

## 2022-02-23 NOTE — H&P (Signed)
HISTORY OF PRESENT ILLNESS:    Alexis Greer is a 81 y.o.female patient who comes for discussion of incisional hernia repair.  Patient with history of cholecystectomy now with incisional hernia in the supraumbilical wound.  This has been growing slowly.  Now is getting very tender on palpation and getting more difficult to reduce.  I was unable to reduce the hernia at bedside today due to pain.  She denies any episode of abdominal distention, nausea or vomiting.  Pain is localized to the anterior mid abdomen.  No radiation.  Aggravating factors applying pressure.  No elevating factors.  I ordered a CT scan for evaluation of complex hernia.  This showed a 6 x 6 cm incisional hernia.  There is small bowel component and the hernia content.  No sign of obstruction.  I personally evaluated the images.      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, DCIS s/p surgery and radiation   Cataract cortical, senile    Complication of anesthesia    Dyspnea    GERD (gastroesophageal reflux disease)    History of gout    History of radiation therapy 2016   History of radiation therapy 2022   left breast mammosite placed 03-08-21   Hypercalcemia and hyperparathyroidism    Resolved, s/p right neck inferior parathyroid adenoma (surgeon Dr Celine Ahr, [facility]) on 07/14/2020   Hyperlipidemia    Hypertension    Pneumonia    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, Mammosite radiation; s/p right breast lumpectomy and SLNBx, s/p radiation therapy   Stage 3 chronic kidney disease (CMS-HCC)    Stroke (CMS-HCC) 2016   Tubular adenoma of colon 06/13/2016   Type 2 diabetes mellitus (CMS-HCC)    Vitamin D deficiency         PAST SURGICAL HISTORY:   Past Surgical History:   Procedure Laterality Date   HYSTERECTOMY  1979   OOPHORECTOMY  2014   history of breast biopsy Right 11/17/2014   MASTECTOMY PARTIAL / LUMPECTOMY Right 12/01/2014   Breast Cancer   COLONOSCOPY  06/13/2016   Tubular adenoma of colon/Repeat 35yr/MUS   lower extremity intervention Left 11/09/2016   Dr. DLucky Cowboy  CHOLECYSTECTOMY  2020   COLONOSCOPY  12/03/2019   Tubular adenomas/PHx CP/No Repeat due to age/TKT   PARATHYROIDECTOMY  07/14/2020   affirm breast biopsy Left 12/27/2020   MASTECTOMY PARTIAL Left 02/04/2021   Dr ELesli Albeew/ [place]   Mammosite Left 03/08/2021   Treatment left breast DCIS   CATARACT EXTRACTION Bilateral    TONSILLECTOMY           MEDICATIONS:  Outpatient Encounter Medications as of 02/23/2022  Medication Sig Dispense Refill   ACCU-CHEK AVIVA PLUS TEST STRP test strip TEST BLOOD SUGAR TWICE DAILY AS DIRECTED 200 strip 3   acetaminophen (TYLENOL) 650 MG ER tablet Take 1 tablet every 4-6 hours as needed for pain 30 tablet 0   allopurinoL (ZYLOPRIM) 100 MG tablet TAKE 1 TABLET TWICE DAILY 180 tablet 3   amLODIPine (NORVASC) 10 MG tablet TAKE 1 TABLET EVERY DAY 90 tablet 3   anastrozole (ARIMIDEX) 1 mg tablet Take by mouth     aspirin 81 MG  EC tablet Take 81 mg by mouth once daily.     atorvastatin (LIPITOR) 40 MG tablet TAKE 1 TABLET EVERY NIGHT 90 tablet 3   cholecalciferol, vitamin D3, (VITAMIN D3) 125 mcg (5,000 unit) tablet 1 tablet daily - dose unknown       diphenhydrAMINE (BENADRYL) 50 MG capsule Take by mouth every 6 (six) hours as needed     DROPLET PEN NEEDLE 31 gauge x 3/16" needle USE TWICE DAILY 200 each 2   ezetimibe (ZETIA) 10 mg tablet TAKE 1 TABLET EVERY DAY 90 tablet 3   insulin NPH hum/reg insulin hm (NOVOLIN 70-30 FLEXPEN U-100 SUBQ) Inject subcutaneously Inject 22 units with breakfast and 38 units before dinner     losartan (COZAAR) 25 MG tablet Take 2 tablets (50 mg total) by mouth once daily 180 tablet 1   magnesium oxide (MAG-OX) 400 mg  (241.3 mg magnesium) tablet TAKE 1 TABLET TWICE DAILY 180 tablet 1   metoprolol succinate (TOPROL-XL) 50 MG XL tablet Take 2 tablets (100 mg total) by mouth once daily 180 tablet 1   multivitamin tablet Take 1 tablet by mouth once daily.     omeprazole (PRILOSEC) 20 MG DR capsule Take 2 capsules (40 mg total) by mouth once daily 180 capsule 3   pen needle, diabetic (BD ULTRA-FINE MINI PEN NEEDLE) 31 gauge x 3/16" needle 2 (two) times daily 200 each 1   TORsemide (DEMADEX) 20 MG tablet TAKE 1 TABLET BY MOUTH ONCE DAILY AS DIRECTED FOR LEG EDEMA     VITAMIN B-12 1,000 mcg SL tablet Place 1,000 mcg under the tongue once daily       vitamin E 400 UNIT capsule Take 1 capsule by mouth once daily       No facility-administered encounter medications on file as of 02/23/2022.     ALLERGIES:   Oxycodone   SOCIAL HISTORY:  Social History   Socioeconomic History   Marital status: Single  Tobacco Use   Smoking status: Former    Types: Cigarettes    Quit date: 08/25/1993    Years since quitting: 28.5   Smokeless tobacco: Never  Substance and Sexual Activity   Alcohol use: No   Drug use: No   Sexual activity: Never  Social History Narrative   Prevnar: 2018    FAMILY HISTORY:  Family History  Problem Relation Age of Onset   Heart disease Mother    High blood pressure (Hypertension) Mother    Hyperlipidemia (Elevated cholesterol) Father    Coronary Artery Disease (Blocked arteries around heart) Father    Lung cancer Sister    Diabetes type II Brother    Colon cancer Brother    Thyroid disease Daughter    Breast cancer Neg Hx      GENERAL REVIEW OF SYSTEMS:   General ROS: negative for - chills, fatigue, fever, weight gain or weight loss Allergy and Immunology ROS: negative for - hives  Hematological and Lymphatic ROS: negative for - bleeding problems or bruising, negative for palpable nodes Endocrine ROS: negative for - heat or cold intolerance, hair changes Respiratory ROS:  negative for - cough, shortness of breath or wheezing Cardiovascular ROS: no chest pain or palpitations GI ROS: negative for nausea, vomiting, abdominal pain, diarrhea, constipation Musculoskeletal ROS: negative for - joint swelling or muscle pain Neurological ROS: negative for - confusion, syncope Dermatological ROS: negative for pruritus and rash  PHYSICAL EXAM:  Vitals:   02/23/22 0742  BP: (!) 173/77  Pulse: 73  .  Ht:157.5 cm ('5\' 2"'$ ) Wt:(!) 107 kg (236 lb) ZOX:WRUE surface area is 2.16 meters squared. Body mass index is 43.16 kg/m.Marland Kitchen   GENERAL: Alert, active, oriented x3  HEENT: Pupils equal reactive to light. Extraocular movements are intact. Sclera clear. Palpebral conjunctiva normal red color.Pharynx clear.  NECK: Supple with no palpable mass and no adenopathy.  LUNGS: Sound clear with no rales rhonchi or wheezes.  HEART: Regular rhythm S1 and S2 without murmur.  ABDOMEN: Soft and depressible, nontender with no palpable mass, no hepatomegaly. Large ventral hernia unable to be completely reduced, tender to palpation.  EXTREMITIES: Well-developed well-nourished symmetrical with no dependent edema.  NEUROLOGICAL: Awake alert oriented, facial expression symmetrical, moving all extremities.      IMPRESSION:     Incisional hernia, without obstruction or gangrene [K43.2]  Patient again oriented about the CT scan finding.  Then about the moderate sized incisional hernia.  The fact that she has been having worsening symptoms and now unable to completely reduce the hernia and is tender to palpation I think that is reasonable to consider either incisional hernia repair.  I discussed with patient that her BMI of 43 making her more prone for recurrence and complications but I do not foresee this patient losing any weight due to her inactivity.  Discussed with patient the importance of healthy diet.  She is unable to walk long distances or doing exercise due to severe knee pain.  I  discussed with patient surgical approach of minimally invasive hernia repair versus open repair.  Discussed with patient the use of mesh.  I discussed with patient the risks of surgery that includes bleeding, infection, injury to adjacent organs such as small bowel, bladder, liver, among others.  Discussed with patient risks of intestinal fistula or complication with prosthesis.  The patient reports she understood this risk and agreed to proceed with surgery.          PLAN:  Robotic assisted laparoscopic incisional hernia repair with mesh (45409) CBC, CMP done 02/15/22 Hold aspirin 5 days before surgery Contact us if you have any concern.   Patient verbalized understanding, all questions were answered, and were agreeable with the plan outlined above.   Herbert Pun, MD  Electronically signed by Herbert Pun, MD

## 2022-02-24 ENCOUNTER — Ambulatory Visit: Payer: Self-pay | Admitting: *Deleted

## 2022-02-24 ENCOUNTER — Encounter: Payer: Self-pay | Admitting: *Deleted

## 2022-02-24 NOTE — Patient Outreach (Signed)
  Care Coordination   Initial Visit Note   02/24/2022 Name: Alexis Greer MRN: [id number] DOB: Aug 19, 1940  Alexis Greer is a 81 y.o. year old female who sees Juluis Pitch, MD for primary care. I spoke with  Alexis Greer by phone today.  What matters to the patients health and wellness today?  Scheduled for ventral hernia repair later this month    Goals Addressed             This Visit's Progress    Have hernia repair without complications       Care Coordination Interventions: Evaluation of current treatment plan related to ventral hernia repair and patient's adherence to plan as established by provider Provided education to patient and/or caregiver about advanced directives Reviewed medications with patient and discussed adherence and affordability Reviewed scheduled/upcoming provider appointments including surgery for 12/22  Advised patient, providing education and rationale, to check cbg daily and record, calling provider for findings outside established parameters  Screening for signs and symptoms of depression related to chronic disease state  Assessed social determinant of health barriers Confirmed patient will have support in the home during recovery, daughter will be present. Discussed advanced life planning, declines resources at this time.          SDOH assessments and interventions completed:  Yes  SDOH Interventions Today    Flowsheet Row Most Recent Value  SDOH Interventions   Food Insecurity Interventions Intervention Not Indicated  Housing Interventions Intervention Not Indicated  Transportation Interventions Intervention Not Indicated  Utilities Interventions Intervention Not Indicated        Care Coordination Interventions:  Yes, provided   Follow up plan: Follow up call scheduled for 12/26    Encounter Outcome:  Pt. Visit Completed   Valente David, RN, MSN, [place] [facility] Care Management  Coordinator [phone]

## 2022-02-24 NOTE — Patient Instructions (Signed)
Visit Information  Thank you for taking time to visit with me today. Please don't hesitate to contact me if I can be of assistance to you before our next scheduled telephone appointment.  Following are the goals we discussed today:  Keep and attend pre-op visit.  Our next appointment is by telephone on 12/26  Please call the care guide team at [phone] if you need to cancel or reschedule your appointment.   Please call the Suicide and Crisis Lifeline: 988 call the [place] National Suicide Prevention Lifeline: 6131757195 or TTY: (681)727-3086 TTY (305)095-3255) to talk to a trained counselor call 1-800-273-TALK (toll free, 24 hour hotline) call 911 if you are experiencing a Mental Health or [place] or need someone to talk to.  Patient verbalizes understanding of instructions and care plan provided today and agrees to view in [facility]. Active MyChart status and patient understanding of how to access instructions and care plan via MyChart confirmed with patient.     The patient has been provided with contact information for the care management team and has been advised to call with any health related questions or concerns.   Valente David, RN, MSN, [facility] Care Management Care Management Coordinator [phone]'

## 2022-03-08 ENCOUNTER — Other Ambulatory Visit: Payer: Self-pay

## 2022-03-08 ENCOUNTER — Encounter
Admission: RE | Admit: 2022-03-08 | Discharge: 2022-03-08 | Disposition: A | Discharge status: Home / Self Care | Payer: Medicare HMO | Source: Ambulatory Visit | Attending: General Surgery | Admitting: General Surgery

## 2022-03-08 DIAGNOSIS — Z01812 Encounter for preprocedural laboratory examination: Secondary | ICD-10-CM

## 2022-03-08 HISTORY — DX: Sleep apnea, unspecified: G47.30

## 2022-03-08 NOTE — Patient Instructions (Addendum)
Your procedure is scheduled on: 03/17/22 - Friday Report to the Registration Desk on the 1st floor of the [facility]. To find out your arrival time, please call [phone] between 1PM - 3PM on: 03/16/22 - Thursday If your arrival time is 6:00 am, do not arrive prior to that time as the [facility] entrance doors do not open until 6:00 am.  REMEMBER: Instructions that are not followed completely may result in serious medical risk, up to and including death; or upon the discretion of your surgeon and anesthesiologist your surgery may need to be rescheduled.  Do not eat food or drink any liquids after midnight the night before surgery.  No gum chewing, lozengers or hard candies.  TAKE THESE MEDICATIONS THE MORNING OF SURGERY WITH A SIP OF WATER:  - allopurinol (ZYLOPRIM)  - amLODipine (NORVASC)  - anastrozole (ARIMIDEX)  - omeprazole (PRILOSEC)  - magnesium oxide  - metoprolol succinate   HOLD aspirin EC 5 days prior to your surgery.  HOLD losartan (COZAAR) on the day of your surgery.  HOLD torsemide (DEMADEX) on the morning of surgery.  NOVOLIN 70/30 -  inject no insulin on the morning of your surgery.  One week prior to surgery: Stop Anti-inflammatories (NSAIDS) such as Advil, Aleve, Ibuprofen, Motrin, Naproxen, Naprosyn and Aspirin based products such as Excedrin, Goodys Powder, BC Powder.  Stop ANY OVER THE COUNTER supplements beginning 03/10/22 until after surgery.VITAMIN D , vitamin B-12 , vitamin E , Multiple Vitamins-Minerals   You may however, continue to take Tylenol if needed for pain up until the day of surgery.  No Alcohol for 24 hours before or after surgery.  No Smoking including e-cigarettes for 24 hours prior to surgery.  No chewable tobacco products for at least 6 hours prior to surgery.  No nicotine patches on the day of surgery.  Do not use any "recreational" drugs for at least a week prior to your surgery.  Please be advised that the combination of  cocaine and anesthesia may have negative outcomes, up to and including death. If you test positive for cocaine, your surgery will be cancelled.  On the morning of surgery brush your teeth with toothpaste and water, you may rinse your mouth with mouthwash if you wish. Do not swallow any toothpaste or mouthwash.  Use CHG Soap or wipes as directed on instruction sheet.  Do not wear jewelry, make-up, hairpins, clips or nail polish.  Do not wear lotions, powders, or perfumes.   Do not shave body from the neck down 48 hours prior to surgery just in case you cut yourself which could leave a site for infection.  Also, freshly shaved skin may become irritated if using the CHG soap.  Contact lenses, hearing aids and dentures may not be worn into surgery.  Do not bring valuables to the hospital. [facility] is not responsible for any missing/lost belongings or valuables.   Notify your doctor if there is any change in your medical condition (cold, fever, infection).  Wear comfortable clothing (specific to your surgery type) to the hospital.  After surgery, you can help prevent lung complications by doing breathing exercises.  Take deep breaths and cough every 1-2 hours. Your doctor may order a device called an Incentive Spirometer to help you take deep breaths. When coughing or sneezing, hold a pillow firmly against your incision with both hands. This is called "splinting." Doing this helps protect your incision. It also decreases belly discomfort.  If you are being admitted to  the hospital overnight, leave your suitcase in the car. After surgery it may be brought to your room.  If you are being discharged the day of surgery, you will not be allowed to drive home. You will need a responsible adult (18 years or older) to drive you home and stay with you that night.   If you are taking public transportation, you will need to have a responsible adult (18 years or older) with you. Please confirm  with your physician that it is acceptable to use public transportation.   Please call the [facility] at [phone] if you have any questions about these instructions.  Surgery Visitation Policy:  Patients undergoing a surgery or procedure may have two family members or support persons with them as long as the person is not COVID-19 positive or experiencing its symptoms.   Inpatient Visitation:    Visiting hours are 7 a.m. to 8 p.m. Up to four visitors are allowed at one time in a patient room. The visitors may rotate out with other people during the day. One designated support person (adult) may remain overnight.  Due to an increase in RSV and influenza rates and associated hospitalizations, children ages 32 and under will not be able to visit patients in [facility]. Masks continue to be strongly recommended.

## 2022-03-13 ENCOUNTER — Encounter
Admission: RE | Admit: 2022-03-13 | Discharge: 2022-03-13 | Disposition: A | Discharge status: Home / Self Care | Payer: Medicare HMO | Source: Ambulatory Visit | Attending: General Surgery | Admitting: General Surgery

## 2022-03-13 DIAGNOSIS — Z01810 Encounter for preprocedural cardiovascular examination: Secondary | ICD-10-CM | POA: Diagnosis not present

## 2022-03-13 DIAGNOSIS — Z01812 Encounter for preprocedural laboratory examination: Secondary | ICD-10-CM

## 2022-03-16 MED ORDER — CEFAZOLIN SODIUM-DEXTROSE 2-4 GM/100ML-% IV SOLN
2.0000 g | INTRAVENOUS | Status: AC
  Administered 2022-03-17: 2 g via INTRAVENOUS

## 2022-03-16 MED ORDER — LACTATED RINGERS IV SOLN: INTRAVENOUS | Status: DC

## 2022-03-16 MED ORDER — SODIUM CHLORIDE 0.9 % IV SOLN: INTRAVENOUS | Status: DC

## 2022-03-16 MED ORDER — CHLORHEXIDINE GLUCONATE 0.12 % MT SOLN: 15.0000 mL | Freq: Once | OROMUCOSAL | Status: AC

## 2022-03-16 MED ORDER — ORAL CARE MOUTH RINSE: 15.0000 mL | Freq: Once | OROMUCOSAL | Status: AC

## 2022-03-17 ENCOUNTER — Encounter
Admission: RE | Disposition: A | Discharge status: Home / Self Care | Payer: Self-pay | Source: Home / Self Care | Attending: General Surgery

## 2022-03-17 ENCOUNTER — Ambulatory Visit: Payer: Medicare HMO | Admitting: Urgent Care

## 2022-03-17 ENCOUNTER — Other Ambulatory Visit: Payer: Self-pay

## 2022-03-17 ENCOUNTER — Ambulatory Visit
Admission: RE | Admit: 2022-03-17 | Discharge: 2022-03-17 | Disposition: A | Discharge status: Home / Self Care | Payer: Medicare HMO | Attending: General Surgery | Admitting: General Surgery

## 2022-03-17 ENCOUNTER — Encounter: Payer: Self-pay | Admitting: General Surgery

## 2022-03-17 DIAGNOSIS — Z6841 Body Mass Index (BMI) 40.0 and over, adult: Secondary | ICD-10-CM | POA: Diagnosis not present

## 2022-03-17 DIAGNOSIS — Z9049 Acquired absence of other specified parts of digestive tract: Secondary | ICD-10-CM | POA: Insufficient documentation

## 2022-03-17 DIAGNOSIS — E1122 Type 2 diabetes mellitus with diabetic chronic kidney disease: Secondary | ICD-10-CM | POA: Diagnosis not present

## 2022-03-17 DIAGNOSIS — K430 Incisional hernia with obstruction, without gangrene: Secondary | ICD-10-CM | POA: Diagnosis not present

## 2022-03-17 DIAGNOSIS — J45909 Unspecified asthma, uncomplicated: Secondary | ICD-10-CM | POA: Diagnosis not present

## 2022-03-17 DIAGNOSIS — I129 Hypertensive chronic kidney disease with stage 1 through stage 4 chronic kidney disease, or unspecified chronic kidney disease: Secondary | ICD-10-CM | POA: Insufficient documentation

## 2022-03-17 DIAGNOSIS — K432 Incisional hernia without obstruction or gangrene: Secondary | ICD-10-CM | POA: Insufficient documentation

## 2022-03-17 DIAGNOSIS — N189 Chronic kidney disease, unspecified: Secondary | ICD-10-CM | POA: Diagnosis not present

## 2022-03-17 DIAGNOSIS — N183 Chronic kidney disease, stage 3 unspecified: Secondary | ICD-10-CM | POA: Diagnosis not present

## 2022-03-17 DIAGNOSIS — Z01812 Encounter for preprocedural laboratory examination: Secondary | ICD-10-CM

## 2022-03-17 HISTORY — PX: XI ROBOTIC ASSISTED VENTRAL HERNIA: SHX6789

## 2022-03-17 LAB — POCT I-STAT, CHEM 8
BUN: 64 mg/dL — ABNORMAL HIGH (ref 8–23)
Calcium, Ion: 1.04 mmol/L — ABNORMAL LOW (ref 1.15–1.40)
Chloride: 115 mmol/L — ABNORMAL HIGH (ref 98–111)
Creatinine, Ser: 2.6 mg/dL — ABNORMAL HIGH (ref 0.44–1.00)
Glucose, Bld: 141 mg/dL — ABNORMAL HIGH (ref 70–99)
HCT: 33 % — ABNORMAL LOW (ref 36.0–46.0)
Hemoglobin: 11.2 g/dL — ABNORMAL LOW (ref 12.0–15.0)
Potassium: 6.2 mmol/L — ABNORMAL HIGH (ref 3.5–5.1)
Sodium: 139 mmol/L (ref 135–145)
TCO2: 20 mmol/L — ABNORMAL LOW (ref 22–32)

## 2022-03-17 LAB — POTASSIUM: Potassium: 4.4 mmol/L (ref 3.5–5.1)

## 2022-03-17 LAB — GLUCOSE, CAPILLARY: Glucose-Capillary: 164 mg/dL — ABNORMAL HIGH (ref 70–99)

## 2022-03-17 SURGERY — REPAIR, HERNIA, VENTRAL, ROBOT-ASSISTED
Anesthesia: General | Site: Abdomen

## 2022-03-17 MED ORDER — BUPIVACAINE LIPOSOME 1.3 % IJ SUSP
INTRAMUSCULAR | Status: AC
  Filled 2022-03-17: qty 20

## 2022-03-17 MED ORDER — SUGAMMADEX SODIUM 500 MG/5ML IV SOLN
INTRAVENOUS | Status: AC
  Filled 2022-03-17: qty 5

## 2022-03-17 MED ORDER — IPRATROPIUM-ALBUTEROL 0.5-2.5 (3) MG/3ML IN SOLN
3.0000 mL | Freq: Once | RESPIRATORY_TRACT | Status: AC
  Administered 2022-03-17: 3 mL via RESPIRATORY_TRACT

## 2022-03-17 MED ORDER — FENTANYL CITRATE (PF) 100 MCG/2ML IJ SOLN
INTRAMUSCULAR | Status: AC
  Filled 2022-03-17: qty 2

## 2022-03-17 MED ORDER — SUCCINYLCHOLINE CHLORIDE 200 MG/10ML IV SOSY
PREFILLED_SYRINGE | INTRAVENOUS | Status: AC
  Filled 2022-03-17: qty 10

## 2022-03-17 MED ORDER — SUCCINYLCHOLINE CHLORIDE 200 MG/10ML IV SOSY
PREFILLED_SYRINGE | INTRAVENOUS | Status: AC
  Filled 2022-03-17: qty 20

## 2022-03-17 MED ORDER — SUGAMMADEX SODIUM 500 MG/5ML IV SOLN
INTRAVENOUS | Status: DC | PRN
  Administered 2022-03-17: 500 mg via INTRAVENOUS

## 2022-03-17 MED ORDER — ONDANSETRON HCL 4 MG/2ML IJ SOLN
INTRAMUSCULAR | Status: AC
  Filled 2022-03-17: qty 2

## 2022-03-17 MED ORDER — IPRATROPIUM-ALBUTEROL 0.5-2.5 (3) MG/3ML IN SOLN
RESPIRATORY_TRACT | Status: AC
  Filled 2022-03-17: qty 3

## 2022-03-17 MED ORDER — ACETAMINOPHEN 10 MG/ML IV SOLN
INTRAVENOUS | Status: AC
  Filled 2022-03-17: qty 100

## 2022-03-17 MED ORDER — EPHEDRINE SULFATE (PRESSORS) 50 MG/ML IJ SOLN
INTRAMUSCULAR | Status: DC | PRN
  Administered 2022-03-17 (×2): 5 mg via INTRAVENOUS

## 2022-03-17 MED ORDER — ROCURONIUM BROMIDE 100 MG/10ML IV SOLN
INTRAVENOUS | Status: DC | PRN
  Administered 2022-03-17: 30 mg via INTRAVENOUS
  Administered 2022-03-17: 20 mg via INTRAVENOUS
  Administered 2022-03-17: 50 mg via INTRAVENOUS
  Administered 2022-03-17 (×2): 20 mg via INTRAVENOUS
  Administered 2022-03-17: 30 mg via INTRAVENOUS

## 2022-03-17 MED ORDER — ONDANSETRON HCL 4 MG/2ML IJ SOLN
INTRAMUSCULAR | Status: DC | PRN
  Administered 2022-03-17: 4 mg via INTRAVENOUS

## 2022-03-17 MED ORDER — LIDOCAINE HCL (PF) 2 % IJ SOLN
INTRAMUSCULAR | Status: AC
  Filled 2022-03-17: qty 5

## 2022-03-17 MED ORDER — BUPIVACAINE-EPINEPHRINE (PF) 0.25% -1:200000 IJ SOLN
INTRAMUSCULAR | Status: AC
  Filled 2022-03-17: qty 30

## 2022-03-17 MED ORDER — TRAMADOL HCL 50 MG PO TABS
ORAL_TABLET | ORAL | Status: AC
  Filled 2022-03-17: qty 1

## 2022-03-17 MED ORDER — CHLORHEXIDINE GLUCONATE 0.12 % MT SOLN
OROMUCOSAL | Status: AC
  Administered 2022-03-17: 15 mL via OROMUCOSAL
  Filled 2022-03-17: qty 15

## 2022-03-17 MED ORDER — CEFAZOLIN SODIUM-DEXTROSE 2-4 GM/100ML-% IV SOLN
INTRAVENOUS | Status: AC
  Filled 2022-03-17: qty 100

## 2022-03-17 MED ORDER — PROPOFOL 1000 MG/100ML IV EMUL
INTRAVENOUS | Status: AC
  Filled 2022-03-17: qty 100

## 2022-03-17 MED ORDER — ONDANSETRON HCL 4 MG/2ML IJ SOLN: 4.0000 mg | Freq: Once | INTRAMUSCULAR | Status: DC | PRN

## 2022-03-17 MED ORDER — DEXAMETHASONE SODIUM PHOSPHATE 10 MG/ML IJ SOLN
INTRAMUSCULAR | Status: DC | PRN
  Administered 2022-03-17: 5 mg via INTRAVENOUS

## 2022-03-17 MED ORDER — FENTANYL CITRATE (PF) 100 MCG/2ML IJ SOLN
INTRAMUSCULAR | Status: DC | PRN
  Administered 2022-03-17 (×6): 25 ug via INTRAVENOUS

## 2022-03-17 MED ORDER — DEXAMETHASONE SODIUM PHOSPHATE 10 MG/ML IJ SOLN
INTRAMUSCULAR | Status: AC
  Filled 2022-03-17: qty 1

## 2022-03-17 MED ORDER — PHENYLEPHRINE HCL (PRESSORS) 10 MG/ML IV SOLN
INTRAVENOUS | Status: DC | PRN
  Administered 2022-03-17: 80 ug via INTRAVENOUS
  Administered 2022-03-17: 100 ug via INTRAVENOUS

## 2022-03-17 MED ORDER — ROCURONIUM BROMIDE 10 MG/ML (PF) SYRINGE
PREFILLED_SYRINGE | INTRAVENOUS | Status: AC
  Filled 2022-03-17: qty 10

## 2022-03-17 MED ORDER — DEXMEDETOMIDINE HCL IN NACL 80 MCG/20ML IV SOLN
INTRAVENOUS | Status: AC
  Filled 2022-03-17: qty 20

## 2022-03-17 MED ORDER — TRAMADOL HCL 50 MG PO TABS: 50.0000 mg | ORAL_TABLET | Freq: Four times a day (QID) | ORAL | 0 refills | Status: AC | PRN

## 2022-03-17 MED ORDER — BUPIVACAINE-EPINEPHRINE (PF) 0.25% -1:200000 IJ SOLN
INTRAMUSCULAR | Status: DC | PRN
  Administered 2022-03-17: 30 mL via PERINEURAL

## 2022-03-17 MED ORDER — TRAMADOL HCL 50 MG PO TABS
50.0000 mg | ORAL_TABLET | Freq: Once | ORAL | Status: AC
  Administered 2022-03-17: 50 mg via ORAL

## 2022-03-17 MED ORDER — PROPOFOL 10 MG/ML IV BOLUS
INTRAVENOUS | Status: DC | PRN
  Administered 2022-03-17: 140 mg via INTRAVENOUS
  Administered 2022-03-17: 100 ug/kg/min via INTRAVENOUS

## 2022-03-17 MED ORDER — FENTANYL CITRATE (PF) 100 MCG/2ML IJ SOLN: 25.0000 ug | INTRAMUSCULAR | Status: DC | PRN

## 2022-03-17 MED ORDER — DEXMEDETOMIDINE HCL IN NACL 80 MCG/20ML IV SOLN
INTRAVENOUS | Status: DC | PRN
  Administered 2022-03-17 (×2): 8 ug via BUCCAL

## 2022-03-17 MED ORDER — LIDOCAINE HCL (CARDIAC) PF 100 MG/5ML IV SOSY
PREFILLED_SYRINGE | INTRAVENOUS | Status: DC | PRN
  Administered 2022-03-17: 100 mg via INTRAVENOUS

## 2022-03-17 MED ORDER — ACETAMINOPHEN 10 MG/ML IV SOLN
INTRAVENOUS | Status: DC | PRN
  Administered 2022-03-17: 1000 mg via INTRAVENOUS

## 2022-03-17 SURGICAL SUPPLY — 49 items
ADH SKN CLS APL DERMABOND .7 (GAUZE/BANDAGES/DRESSINGS) ×1
BAG PRESSURE INF REUSE 1000 (BAG) IMPLANT
CANNULA REDUC XI 12-8 STAPL (CANNULA) ×1
CANNULA REDUCER 12-8 DVNC XI (CANNULA) IMPLANT
COVER TIP SHEARS 8 DVNC (MISCELLANEOUS) ×1 IMPLANT
COVER TIP SHEARS 8MM DA VINCI (MISCELLANEOUS) ×1
COVER WAND RF STERILE (DRAPES) ×1 IMPLANT
DERMABOND ADVANCED .7 DNX12 (GAUZE/BANDAGES/DRESSINGS) ×1 IMPLANT
DRAPE ARM DVNC X/XI (DISPOSABLE) ×3 IMPLANT
DRAPE COLUMN DVNC XI (DISPOSABLE) ×1 IMPLANT
DRAPE DA VINCI XI ARM (DISPOSABLE) ×3
DRAPE DA VINCI XI COLUMN (DISPOSABLE) ×1
ELECT REM PT RETURN 9FT ADLT (ELECTROSURGICAL) ×1
ELECTRODE REM PT RTRN 9FT ADLT (ELECTROSURGICAL) ×1 IMPLANT
GLOVE BIO SURGEON STRL SZ 6.5 (GLOVE) ×2 IMPLANT
GLOVE BIOGEL PI IND STRL 6.5 (GLOVE) ×2 IMPLANT
GOWN STRL REUS W/ TWL LRG LVL3 (GOWN DISPOSABLE) ×3 IMPLANT
GOWN STRL REUS W/TWL LRG LVL3 (GOWN DISPOSABLE) ×3
IRRIGATOR SUCT 8 DISP DVNC XI (IRRIGATION / IRRIGATOR) IMPLANT
IRRIGATOR SUCTION 8MM XI DISP (IRRIGATION / IRRIGATOR)
IV NS 1000ML (IV SOLUTION)
IV NS 1000ML BAXH (IV SOLUTION) IMPLANT
KIT PINK PAD W/HEAD ARE REST (MISCELLANEOUS) ×1
KIT PINK PAD W/HEAD ARM REST (MISCELLANEOUS) ×1 IMPLANT
LABEL OR SOLS (LABEL) ×1 IMPLANT
MANIFOLD NEPTUNE II (INSTRUMENTS) ×1 IMPLANT
MESH VENTRALIGHT ST 6X8 (Mesh Specialty) ×1 IMPLANT
MESH VENTRLGHT ELLIPSE 8X6XMFL (Mesh Specialty) IMPLANT
NDL INSUFFLATION 14GA 120MM (NEEDLE) ×1 IMPLANT
NEEDLE HYPO 22GX1.5 SAFETY (NEEDLE) ×1 IMPLANT
NEEDLE INSUFFLATION 14GA 120MM (NEEDLE) ×1 IMPLANT
OBTURATOR OPTICAL LONG 8 DVNC (TROCAR) IMPLANT
OBTURATOR OPTICAL LONG 8MM (TROCAR) ×1
OBTURATOR OPTICAL STANDARD 8MM (TROCAR) ×1
OBTURATOR OPTICAL STND 8 DVNC (TROCAR) ×1
OBTURATOR OPTICALSTD 8 DVNC (TROCAR) ×1 IMPLANT
PACK LAP CHOLECYSTECTOMY (MISCELLANEOUS) ×1 IMPLANT
SEAL CANN UNIV 5-8 DVNC XI (MISCELLANEOUS) ×3 IMPLANT
SEAL XI 5MM-8MM UNIVERSAL (MISCELLANEOUS) ×3
SET TUBE SMOKE EVAC HIGH FLOW (TUBING) ×1 IMPLANT
SOLUTION ELECTROLUBE (MISCELLANEOUS) ×1 IMPLANT
STAPLER CANNULA SEAL DVNC XI (STAPLE) IMPLANT
STAPLER CANNULA SEAL XI (STAPLE) ×1
SUT MNCRL AB 4-0 PS2 18 (SUTURE) ×1 IMPLANT
SUT STRATAFIX PDS 30 CT-1 (SUTURE) ×1 IMPLANT
SUT V-LOC 90 ABS 3-0 VLT  V-20 (SUTURE) ×3
SUT V-LOC 90 ABS 3-0 VLT V-20 (SUTURE) IMPLANT
TAPE TRANSPORE STRL 2 31045 (GAUZE/BANDAGES/DRESSINGS) ×1 IMPLANT
TRAY FOLEY MTR SLVR 16FR STAT (SET/KITS/TRAYS/PACK) IMPLANT

## 2022-03-17 NOTE — Op Note (Signed)
Preoperative diagnosis: Incisional Hernia  Postoperative diagnosis: Incisional Hernia  Procedure: Robotic assisted laparoscopic incisional hernia repair with mesh  Anesthesia: General  Surgeon: Dr. Windell Moment  Wound Classification: Clean  Specimen: None  Complications: None  Estimated Blood Loss: 27m  Indications: Patient is a 81y.o. female developed a ventral hernia. This was symptomatic and incarcerated and repair was indicated.   Findings: 7 cm incisional hernia 2. Repair achieved with closure of the anterior fascia at midline and 20 x 15 cm Bard mesh 3. Adequate hemostasis         Description of procedure: The patient was brought to the operating room and general anesthesia was induced. A time-out was completed verifying correct patient, procedure, site, positioning, and implant(s) and/or special equipment prior to beginning this procedure. Antibiotics were administered prior to making the incision. SCDs placed. The anterior abdominal wall was prepped and draped in the standard sterile fashion.   Palmer's point chosen for entry.  Veress needle placed and abdomen insufflated to 15cm without any dramatic increase in pressure.  Needle removed and optiview technique used to place 870mport at same point.  No injury noted during placement. Exparel was infused in a TAP block. Two additional ports, 54m82m2 along left lateral aspect placed.  Xi robot then docked into place.  Hernia contents noted and reduced with combination of blunt, sharp dissection with scissors and fenestrated forceps.  Hemostasis achieved throughout this portion.  Once all hernia contents reduced, there was noted to be a 7 cm hernia.    Insufflation dropped to 79m29md transfacial suture with 0 stratafix used to primarily close defect under minimal tension. Bard protected 20 x 15 cm mesh was placed within the abdominal cavity through 12mm70mt and secured to the abdominal wall centered over the defect using  the 0 stratafix previously used to primarily close defect.  The mesh was then circumferentially sutured into the anterior abdominal wall using 2-0 VLock x4.  Any bleeding noted during this portion was no longer actively bleeding by end of securing mesh and tightening the suture.    Robot was undocked.  The 12mm 30mula was removed and port site was closed using PMI device and 0 vicryl suture, ensuring no bowels were injured during this process.  Abdomen then desufflated while camera within abdomen to ensure no signs of new bleed prior to removing camera and rest of ports completely. All skin incisions closed with runninrg 4-0 Monocryl in a subcuticular fashion.  All wounds then dressed with Dermabond.  Patient was then successfully awakened and transferred to PACU in stable condition.  At the end of the procedure sponge and instrument counts were correct.

## 2022-03-17 NOTE — Transfer of Care (Signed)
Immediate Anesthesia Transfer of Care Note  Patient: Alexis Greer  Procedure(s) Performed: XI ROBOTIC ASSISTED VENTRAL HERNIA (Abdomen)  Patient Location: PACU  Anesthesia Type:General  Level of Consciousness: awake  Airway & Oxygen Therapy: Patient Spontanous Breathing and Patient connected to face mask oxygen  Post-op Assessment: Report given to RN, Post -op Vital signs reviewed and stable, and Patient moving all extremities  Post vital signs: Reviewed  Last Vitals:  Vitals Value Taken Time  BP 119/62 03/17/22 1100  Temp 36.1 C 03/17/22 1043  Pulse 62 03/17/22 1110  Resp 19 03/17/22 1110  SpO2 96 % 03/17/22 1110  Vitals shown include unvalidated device data.  Last Pain:  Vitals:   03/17/22 1043  TempSrc:   PainSc: 4          Complications: No notable events documented.

## 2022-03-17 NOTE — Anesthesia Procedure Notes (Signed)
Procedure Name: Intubation Date/Time: 03/17/2022 7:55 AM  Performed by: Candice Camp, CRNAPre-anesthesia Checklist: Timeout performed, Patient being monitored, Suction available, Emergency Drugs available and Patient identified Patient Re-evaluated:Patient Re-evaluated prior to induction Oxygen Delivery Method: Circle system utilized Preoxygenation: Pre-oxygenation with 100% oxygen Induction Type: IV induction Ventilation: Mask ventilation without difficulty Laryngoscope Size: McGraph, 3 and 4 Grade View: Grade II Tube type: Oral Tube size: 7.0 mm Number of attempts: 2 Airway Equipment and Method: Stylet and Video-laryngoscopy Placement Confirmation: ETT inserted through vocal cords under direct vision, positive ETCO2 and breath sounds checked- equal and bilateral Secured at: 22 cm Tube secured with: Tape Dental Injury: Teeth and Oropharynx as per pre-operative assessment

## 2022-03-17 NOTE — Discharge Instructions (Addendum)
  Diet: Resume home heart healthy regular diet.   Activity: No heavy lifting >20 pounds (children, pets, laundry, garbage) or strenuous activity until follow-up, but light activity and walking are encouraged. Do not drive or drink alcohol if taking narcotic pain medications.  Wound care: May shower with soapy water and pat dry (do not rub incisions), but no baths or submerging incision underwater until follow-up. (no swimming)   Medications: Resume all home medications. For mild to moderate pain: acetaminophen (Tylenol) or ibuprofen (if no kidney disease). Combining Tylenol with alcohol can substantially increase your risk of causing liver disease. Narcotic pain medications, if prescribed, can be used for severe pain, though may cause nausea, constipation, and drowsiness. If you do not need the narcotic pain medication, you do not need to fill the prescription.  Call office [phone]) at any time if any questions, worsening pain, fevers/chills, bleeding, drainage from incision site, or other concerns.   AMBULATORY SURGERY  DISCHARGE INSTRUCTIONS   The drugs that you were given will stay in your system until tomorrow so for the next 24 hours you should not:  Drive an automobile Make any legal decisions Drink any alcoholic beverage   You may resume regular meals tomorrow.  Today it is better to start with liquids and gradually work up to solid foods.  You may eat anything you prefer, but it is better to start with liquids, then soup and crackers, and gradually work up to solid foods.   Please notify your doctor immediately if you have any unusual bleeding, trouble breathing, redness and pain at the surgery site, drainage, fever, or pain not relieved by medication.    Your post-operative visit with Dr.                                       is: Date:                        Time:    Please call to schedule your post-operative visit.  Additional Instructions:

## 2022-03-17 NOTE — Interval H&P Note (Signed)
History and Physical Interval Note:  03/17/2022 7:32 AM  [name]  has presented today for surgery, with the diagnosis of K43.2 Incisional hernia w/o obstruction or gangrene.  The various methods of treatment have been discussed with the patient and family. After consideration of risks, benefits and other options for treatment, the patient has consented to  Procedure(s): XI ROBOTIC [place] (N/A) as a surgical intervention.  The patient's history has been reviewed, patient examined, no change in status, stable for surgery.  I have reviewed the patient's chart and labs.  Questions were answered to the patient's satisfaction.     Herbert Pun

## 2022-03-17 NOTE — Anesthesia Postprocedure Evaluation (Signed)
Anesthesia Post Note  Patient: Alexis Greer  Procedure(s) Performed: XI ROBOTIC ASSISTED VENTRAL HERNIA (Abdomen)  Patient location during evaluation: PACU Anesthesia Type: General Level of consciousness: oriented Pain management: pain level controlled Vital Signs Assessment: post-procedure vital signs reviewed and stable Respiratory status: spontaneous breathing and respiratory function stable Cardiovascular status: stable Anesthetic complications: no  No notable events documented.   Last Vitals:  Vitals:   03/17/22 1145 03/17/22 1200  BP: (!) 112/58 (!) 117/52  Pulse: 61   Resp: 12   Temp:    SpO2: 92% 92%    Last Pain:  Vitals:   03/17/22 1200  TempSrc:   PainSc: 3                  VAN STAVEREN,Isabele Lollar

## 2022-03-17 NOTE — Anesthesia Preprocedure Evaluation (Signed)
Anesthesia Evaluation  Patient identified by MRN, date of birth, ID band Patient awake    Reviewed: Allergy & Precautions, NPO status , Patient's Chart, lab work & pertinent test results  Airway Mallampati: III  TM Distance: >3 FB Neck ROM: full    Dental  (+) Edentulous Upper, Edentulous Lower   Pulmonary neg pulmonary ROS, shortness of breath and with exertion, asthma , sleep apnea , Patient abstained from smoking., former smoker   Pulmonary exam normal  + decreased breath sounds      Cardiovascular Exercise Tolerance: Good hypertension, Pt. on medications negative cardio ROS Normal cardiovascular exam Rhythm:Regular Rate:Normal     Neuro/Psych CVA negative neurological ROS  negative psych ROS   GI/Hepatic negative GI ROS, Neg liver ROS, hiatal hernia,GERD  ,,  Endo/Other  negative endocrine ROSdiabetes    Renal/GU CRFRenal disease  negative genitourinary   Musculoskeletal  (+) Arthritis ,    Abdominal  (+) + obese  Peds negative pediatric ROS (+)  Hematology negative hematology ROS (+) Blood dyscrasia, anemia   Anesthesia Other Findings Past Medical History: No date: Anemia No date: Arthritis No date: Asthma No date: B12 deficiency 11/17/2014: Breast cancer ([place])     Comment:  right breast lumpectomy, triple negative, with mammosite              tx. Chemo with held due to CVA after lumpectomy No date: CKD (chronic kidney disease), stage IV ([place]) 3875: Complication of anesthesia     Comment:  had a stroke during the breast lumpectomy No date: Diabetes mellitus without complication (HCC) No date: Dyspnea No date: GERD (gastroesophageal reflux disease) No date: Gout No date: History of hiatal hernia No date: Hyperlipidemia No date: Hyperparathyroidism ([place]) No date: Hypertension No date: Malignant neoplasm ([place]) No date: Morbid obesity ([place]) 2016: Personal history of radiation therapy     Comment:    mammosite radiation-RIGHT lumpectomy No date: Pneumonia No date: Sleep apnea 2016: Stroke [facility])     Comment:  post lumpectomy No date: Vitamin D deficiency  Past Surgical History: 1979: ABDOMINAL HYSTERECTOMY 11/17/2014: BREAST BIOPSY; Right     Comment:  Invasive mammory carcinoma 12/01/2014: BREAST BIOPSY; Right     Comment:  Procedure: BREAST BIOPSY WITH NEEDLE LOCALIZATION;                Surgeon: Christene Lye, MD;  Location: [facility] ORS;               Service: General;  Laterality: Right; 12/27/2020: BREAST BIOPSY; Left     Comment:  Affirm bx-asymmetry/calcs "Ribbon" clip-path pending 12/27/2021: BREAST BIOPSY; Right     Comment:  [place] bx, coil clip, path pending 2016: BREAST LUMPECTOMY; Right     Comment:  INVASIVE MAMMARY CARCINOMA  12/01/2014: BREAST LUMPECTOMY WITH SENTINEL LYMPH NODE BIOPSY; Right     Comment:  Procedure: BREAST LUMPECTOMY WITH SENTINEL LYMPH NODE               BX;  Surgeon: Christene Lye, MD;  Location: [facility]               ORS;  Service: General;  Laterality: Right; 2020: CHOLECYSTECTOMY 06/13/2016: COLONOSCOPY WITH PROPOFOL; N/A     Comment:  Procedure: COLONOSCOPY WITH PROPOFOL;  Surgeon: Lollie Sails, MD;  Location[facility] ENDOSCOPY;  Service:               Endoscopy;  Laterality:  N/A; 12/03/2019: COLONOSCOPY WITH PROPOFOL; N/A     Comment:  Procedure: COLONOSCOPY WITH PROPOFOL;  Surgeon: [place],               Benay Pike, MD;  Location: [facility] ENDOSCOPY;  Service:               Gastroenterology;  Laterality: N/A; No date: EYE SURGERY; Bilateral     Comment:  CATARACT REMOVAL 11/09/2016: LOWER EXTREMITY INTERVENTION; Left     Comment:  Procedure: LOWER EXTREMITY INTERVENTION;  Surgeon: Algernon Huxley, MD;  Location: [facility] CV LAB;  Service:               Cardiovascular;  Laterality: Left; No date: MASTECTOMY; Right     Comment:  lumpectomy  2014: OOPHORECTOMY 07/14/2020: PARATHYROIDECTOMY; N/A      Comment:  Procedure: PARATHYROIDECTOMY, RNFA to assist;  Surgeon:               Fredirick Maudlin, MD;  Location: [facility] ORS;  Service:               General;  Laterality: N/A; 02/04/2021: PARTIAL MASTECTOMY WITH NEEDLE LOCALIZATION; Left     Comment:  Procedure: PARTIAL MASTECTOMY WITH NEEDLE LOCALIZATION;               Surgeon: Herbert Pun, MD;  Location: [facility] ORS;               Service: General;  Laterality: Left; No date: TONSILLECTOMY  BMI    Body Mass Index: 42.62 kg/m      Reproductive/Obstetrics negative OB ROS                             Anesthesia Physical Anesthesia Plan  ASA: 3  Anesthesia Plan: General   Post-op Pain Management:    Induction: Intravenous  PONV Risk Score and Plan: Ondansetron, Dexamethasone, Midazolam and Treatment may vary due to age or medical condition  Airway Management Planned: Oral ETT  Additional Equipment:   Intra-op Plan:   Post-operative Plan: Extubation in OR  Informed Consent: I have reviewed the patients History and Physical, chart, labs and discussed the procedure including the risks, benefits and alternatives for the proposed anesthesia with the patient or authorized representative who has indicated his/her understanding and acceptance.     Dental Advisory Given  Plan Discussed with: CRNA and Surgeon  Anesthesia Plan Comments:        Anesthesia Quick Evaluation

## 2022-03-17 NOTE — Progress Notes (Signed)
Reported low oxygen saturation to Dr. Amie Critchley.  Incentive spirometry,C&DB being done duoneb given.  Lungs clear.  Encouraged more incentive spirometry.

## 2022-03-20 ENCOUNTER — Encounter: Payer: Self-pay | Admitting: General Surgery

## 2022-03-21 ENCOUNTER — Encounter: Payer: Self-pay | Admitting: *Deleted

## 2022-03-21 ENCOUNTER — Telehealth: Payer: Self-pay | Admitting: *Deleted

## 2022-03-21 NOTE — Patient Outreach (Signed)
  Care Coordination   Follow Up Visit Note   03/21/2022 Name: CHELLA CHAPDELAINE MRN: [id number] DOB: Apr 04, 1940  [name] is a 81 y.o. year old female who sees Juluis Pitch, MD for primary care. I  spoke with Threasa Beards at Dr. Ferrel Logan office regarding patient's complaint of pain post hernia repair last week.  Per Care Guide, patient didn't want to speak with care coordinator today, stating it was painful for her to talk.  Call was placed to surgeon's office to report patient's complaint, they will call patient directly to follow up.    What matters to the patients health and wellness today?  Uncontrolled pain   SDOH assessments and interventions completed:  No     Care Coordination Interventions:  Yes, provided   Follow up plan: Follow up call scheduled for 12/28    Encounter Outcome:  Pt. Visit Completed   Valente David, RN, MSN, [facility] Care Management Care Management Coordinator [phone]

## 2022-03-21 NOTE — Progress Notes (Signed)
  Care Coordination Note  03/21/2022 Name: Alexis Greer MRN: [id number] DOB: 10-07-1940  Alexis Greer is a 81 y.o. year old female who is a primary care patient of Alexis Pitch, MD and is actively engaged with the care management team. I reached out to Alexis Greer by phone today to assist with re-scheduling a follow up visit with the RN Case Manager. Patient did not want to reschedule at this time.   Follow up plan: [facility] collaborated with Alexis Greer and she will contact physician and patient.   [place]  Direct Dial: [phone]

## 2022-03-23 ENCOUNTER — Ambulatory Visit: Payer: Self-pay | Admitting: *Deleted

## 2022-03-23 NOTE — Patient Outreach (Signed)
  Care Coordination   Follow Up Visit Note   03/23/2022 Name: Alexis Greer MRN: [id number] DOB: [birth year]  Alexis Greer is a 81 y.o. year old female who sees Juluis Pitch, MD for primary care. I spoke with  Alexis Greer by phone today.  What matters to the patients health and wellness today?  Still having soreness post surgery but better.  Has cough, taking cough medicine for relief.  Has daughter to help.  Declines ongoing calls.  Denies any urgent concerns, encouraged to contact this care manager with questions.      Goals Addressed             This Visit's Progress    COMPLETED: Have hernia repair without complications   On track    Care Coordination Interventions: Evaluation of current treatment plan related to ventral hernia repair and patient's adherence to plan as established by provider Provided education to patient and/or caregiver about advanced directives Reviewed medications with patient and discussed adherence and affordability Reviewed scheduled/upcoming provider appointments including follow up with surgeon next month Advised patient, providing education and rationale, to check cbg daily and record, calling provider for findings outside established parameters  Call was placed to surgeon's office yesterday to report pain.  Patient confirms she was contacted by office, feels slightly better, understands it will take some time for her to feel completely better.            SDOH assessments and interventions completed:  No     Care Coordination Interventions:  Yes, provided   Follow up plan: No further intervention required.   Encounter Outcome:  Pt. Visit Completed   Valente David, RN, MSN, [place] Care Management Care Management Coordinator [phone]

## 2022-03-23 NOTE — Patient Instructions (Signed)
Visit Information  Thank you for taking time to visit with me today. Please don't hesitate to contact me if I can be of assistance to you.  Following are the goals we discussed today:  Seek medical attention of cough or pain does not get better.   Please call the Suicide and Crisis Lifeline: 988 call the [place] National Suicide Prevention Lifeline: (412)314-6231 or TTY: (414) 194-1428 TTY (514)675-4900) to talk to a trained counselor call 1-800-273-TALK (toll free, 24 hour hotline) call 911 if you are experiencing a Mental Health or [place] or need someone to talk to.  Patient verbalizes understanding of instructions and care plan provided today and agrees to view in [facility]. Active MyChart status and patient understanding of how to access instructions and care plan via MyChart confirmed with patient.     The patient has been provided with contact information for the care management team and has been advised to call with any health related questions or concerns.   Valente David, RN, MSN, [facility] Care Management Care Management Coordinator [phone]

## 2022-03-29 ENCOUNTER — Other Ambulatory Visit: Payer: Self-pay | Admitting: Oncology

## 2022-03-29 ENCOUNTER — Ambulatory Visit: Payer: Medicare HMO | Admitting: Radiation Oncology

## 2022-03-31 DIAGNOSIS — K432 Incisional hernia without obstruction or gangrene: Secondary | ICD-10-CM | POA: Diagnosis not present

## 2022-04-06 DIAGNOSIS — R809 Proteinuria, unspecified: Secondary | ICD-10-CM | POA: Diagnosis not present

## 2022-04-06 DIAGNOSIS — E1122 Type 2 diabetes mellitus with diabetic chronic kidney disease: Secondary | ICD-10-CM | POA: Diagnosis not present

## 2022-04-06 DIAGNOSIS — I10 Essential (primary) hypertension: Secondary | ICD-10-CM | POA: Diagnosis not present

## 2022-04-06 DIAGNOSIS — N184 Chronic kidney disease, stage 4 (severe): Secondary | ICD-10-CM | POA: Diagnosis not present

## 2022-04-13 DIAGNOSIS — N184 Chronic kidney disease, stage 4 (severe): Secondary | ICD-10-CM | POA: Diagnosis not present

## 2022-04-13 DIAGNOSIS — E1122 Type 2 diabetes mellitus with diabetic chronic kidney disease: Secondary | ICD-10-CM | POA: Diagnosis not present

## 2022-04-13 DIAGNOSIS — I10 Essential (primary) hypertension: Secondary | ICD-10-CM | POA: Diagnosis not present

## 2022-04-13 DIAGNOSIS — R600 Localized edema: Secondary | ICD-10-CM | POA: Diagnosis not present

## 2022-04-13 DIAGNOSIS — R809 Proteinuria, unspecified: Secondary | ICD-10-CM | POA: Diagnosis not present

## 2022-04-13 DIAGNOSIS — N2581 Secondary hyperparathyroidism of renal origin: Secondary | ICD-10-CM | POA: Diagnosis not present

## 2022-04-28 DIAGNOSIS — K432 Incisional hernia without obstruction or gangrene: Secondary | ICD-10-CM | POA: Diagnosis not present

## 2022-06-15 ENCOUNTER — Other Ambulatory Visit: Payer: Self-pay

## 2022-06-15 ENCOUNTER — Encounter: Payer: Self-pay | Admitting: Oncology

## 2022-06-15 ENCOUNTER — Inpatient Hospital Stay: Payer: Medicare HMO | Attending: Oncology | Admitting: Oncology

## 2022-06-15 ENCOUNTER — Inpatient Hospital Stay: Payer: Medicare HMO

## 2022-06-15 VITALS — BP 133/62 | HR 79 | Temp 97.4°F | Resp 18 | Ht 62.0 in | Wt 228.8 lb

## 2022-06-15 DIAGNOSIS — D0512 Intraductal carcinoma in situ of left breast: Secondary | ICD-10-CM | POA: Diagnosis not present

## 2022-06-15 DIAGNOSIS — Z853 Personal history of malignant neoplasm of breast: Secondary | ICD-10-CM | POA: Insufficient documentation

## 2022-06-15 DIAGNOSIS — N189 Chronic kidney disease, unspecified: Secondary | ICD-10-CM | POA: Insufficient documentation

## 2022-06-15 DIAGNOSIS — Z87891 Personal history of nicotine dependence: Secondary | ICD-10-CM | POA: Insufficient documentation

## 2022-06-15 DIAGNOSIS — Z923 Personal history of irradiation: Secondary | ICD-10-CM | POA: Insufficient documentation

## 2022-06-15 DIAGNOSIS — D631 Anemia in chronic kidney disease: Secondary | ICD-10-CM | POA: Insufficient documentation

## 2022-06-15 DIAGNOSIS — M8580 Other specified disorders of bone density and structure, unspecified site: Secondary | ICD-10-CM | POA: Insufficient documentation

## 2022-06-15 DIAGNOSIS — Z79899 Other long term (current) drug therapy: Secondary | ICD-10-CM | POA: Diagnosis not present

## 2022-06-15 LAB — CBC WITH DIFFERENTIAL/PLATELET
Abs Immature Granulocytes: 0.02 10*3/uL (ref 0.00–0.07)
Basophils Absolute: 0 10*3/uL (ref 0.0–0.1)
Basophils Relative: 1 %
Eosinophils Absolute: 0.2 10*3/uL (ref 0.0–0.5)
Eosinophils Relative: 3 %
HCT: 33.7 % — ABNORMAL LOW (ref 36.0–46.0)
Hemoglobin: 10.6 g/dL — ABNORMAL LOW (ref 12.0–15.0)
Immature Granulocytes: 0 %
Lymphocytes Relative: 32 %
Lymphs Abs: 1.9 10*3/uL (ref 0.7–4.0)
MCH: 28.6 pg (ref 26.0–34.0)
MCHC: 31.5 g/dL (ref 30.0–36.0)
MCV: 90.8 fL (ref 80.0–100.0)
Monocytes Absolute: 0.7 10*3/uL (ref 0.1–1.0)
Monocytes Relative: 12 %
Neutro Abs: 3.1 10*3/uL (ref 1.7–7.7)
Neutrophils Relative %: 52 %
Platelets: 238 10*3/uL (ref 150–400)
RBC: 3.71 MIL/uL — ABNORMAL LOW (ref 3.87–5.11)
RDW: 17.2 % — ABNORMAL HIGH (ref 11.5–15.5)
WBC: 6 10*3/uL (ref 4.0–10.5)
nRBC: 0 % (ref 0.0–0.2)

## 2022-06-15 LAB — COMPREHENSIVE METABOLIC PANEL
ALT: 20 U/L (ref 0–44)
AST: 44 U/L — ABNORMAL HIGH (ref 15–41)
Albumin: 4.1 g/dL (ref 3.5–5.0)
Alkaline Phosphatase: 104 U/L (ref 38–126)
Anion gap: 7 (ref 5–15)
BUN: 48 mg/dL — ABNORMAL HIGH (ref 8–23)
CO2: 22 mmol/L (ref 22–32)
Calcium: 8.9 mg/dL (ref 8.9–10.3)
Chloride: 108 mmol/L (ref 98–111)
Creatinine, Ser: 2.51 mg/dL — ABNORMAL HIGH (ref 0.44–1.00)
GFR, Estimated: 19 mL/min — ABNORMAL LOW (ref >60)
Glucose, Bld: 151 mg/dL — ABNORMAL HIGH (ref 70–99)
Potassium: 4.8 mmol/L (ref 3.5–5.1)
Sodium: 137 mmol/L (ref 135–145)
Total Bilirubin: 0.6 mg/dL (ref 0.3–1.2)
Total Protein: 7.8 g/dL (ref 6.5–8.1)

## 2022-06-15 LAB — RETIC PANEL
Immature Retic Fract: 19.7 % — ABNORMAL HIGH (ref 2.3–15.9)
RBC.: 3.72 MIL/uL — ABNORMAL LOW (ref 3.87–5.11)
Retic Count, Absolute: 74.8 10*3/uL (ref 19.0–186.0)
Retic Ct Pct: 2 % (ref 0.4–3.1)
Reticulocyte Hemoglobin: 30.2 pg (ref >27.9)

## 2022-06-15 LAB — IRON AND TIBC
Iron: 52 ug/dL (ref 28–170)
Saturation Ratios: 19 % (ref 10.4–31.8)
TIBC: 269 ug/dL (ref 250–450)
UIBC: 217 ug/dL

## 2022-06-15 LAB — FERRITIN: Ferritin: 50 ng/mL (ref 11–307)

## 2022-06-15 NOTE — Assessment & Plan Note (Addendum)
11/17/14 right breast, invasive carcinoma, T1a N0  ER-, PR-, HER2 2+, FISH - Continue imaging surveillance  Continue mammogram surveillance.  Right breast lesion 78mm,  US guided biopsy - organized fat necrosis no atypia or malignancy

## 2022-06-15 NOTE — Assessment & Plan Note (Signed)
Hemoglobin is trending down.  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  Will check iron panel.

## 2022-06-15 NOTE — Assessment & Plan Note (Addendum)
Left DCIS ER+,  S/p lumpectomy [12/27/2020] and adjuvant radiation. Labs reviewed and discussed with patient Continue Arimidex 1 mg daily. Patient previously declined genetic testing. Continue bilateral annual mammogram -September 2024

## 2022-06-15 NOTE — Assessment & Plan Note (Signed)
02/23/2022 DEXA showed osteopenia, 10 year risk of hip fracture 0.7.      10 year risk of any major fracture.  4.8% Recommend patient to continue calcium and vitamin D supplementation.

## 2022-06-15 NOTE — Progress Notes (Signed)
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 Labs reviewed and discussed with patient Continue Arimidex 1 mg daily. Patient previously declined genetic testing. 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  Right breast lesion 62mm,  US guided biopsy - organized fat necrosis no atypia or malignancy  Osteopenia 02/23/2022 DEXA showed osteopenia, 10 year risk of hip fracture 0.7.      10 year risk of any major fracture.  4.8% Recommend patient to continue calcium and vitamin D supplementation.  Anemia in chronic kidney disease (CKD) Hemoglobin is trending down.  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  Will check iron panel.   Orders Placed This Encounter  Procedures   MM 3D DIAGNOSTIC MAMMOGRAM BILATERAL BREAST    Standing Status:   Future    Standing Expiration Date:   06/15/2023    Order Specific Question:   Reason for Exam (SYMPTOM  OR DIAGNOSIS REQUIRED)    Answer:   DCIS left breast    Order Specific Question:   Preferred imaging location?    Answer:   [facility]   [place] LIMITED ULTRASOUND INCLUDING AXILLA LEFT BREAST     Standing Status:   Future    Standing Expiration Date:   06/15/2023    Order Specific Question:   Reason for Exam (SYMPTOM  OR DIAGNOSIS REQUIRED)    Answer:   DCIS left breast    Order Specific Question:   Preferred imaging location?    Answer:    [facility]   [place] LIMITED ULTRASOUND INCLUDING AXILLA RIGHT BREAST    Standing Status:   Future    Standing Expiration Date:   06/15/2023    Order Specific Question:   Reason for Exam (SYMPTOM  OR DIAGNOSIS REQUIRED)    Answer:   DCIS left breast    Order Specific Question:   Preferred imaging location?    Answer:   [facility]   CBC with Differential ([place] Only)    Standing Status:   Future    Standing Expiration Date:   06/15/2023   Iron and TIBC    Standing Status:   Future    Standing Expiration Date:   06/15/2023   Ferritin    Standing Status:   Future    Standing Expiration Date:   06/15/2023   Retic Panel    Standing Status:   Future    Standing Expiration Date:   06/15/2023   Ferritin    Standing Status:   Future    Number of Occurrences:   1    Standing Expiration Date:   06/15/2023   Retic Panel    Standing Status:   Future    Number of Occurrences:   1    Standing Expiration Date:   06/15/2023   Iron and TIBC    Standing Status:   Future    Number of Occurrences:   1    Standing  Expiration Date:   06/15/2023   Follow up in 6 months, or earlier if her right breast biopsy is positive All questions were answered. The patient knows to call the clinic with any problems, questions or concerns.  Alexis Server, MD, PhD [facility] Hematology Oncology 06/15/2022     HISTORY OF PRESENTING ILLNESS:   Alexis Greer is a  82 y.o.  female with PMH listed below was seen in consultation at the request of  Alexis Pitch, MD  for evaluation of breast cancer  Patient has a history of breast cancer in 2016.  Managed by surgery and radiation oncology. 11/17/2014, patient underwent right breast biopsy which showed invasive mammary carcinoma, grade 3, tumor spans up to 5 mm no special type, ER negative, PR negative, HER2 IHC equivocal 2+, FISH negative. Patient underwent right breast lumpectomy and sentinel lymph node biopsy.  No residual malignancy found on her  lumpectomy specimen.  Sclerosing adenosis with apocrine metaplasia.  Stromal hemorrhage. She received radiation adjuvantly.  12/13/2020, bilateral breast screening mammogram showed left breast calcification warrants further evaluation.  Right breast no findings suspicious for malignancy. 12/20/2020, diagnostic left mammogram showed left breast upper outer quadrant indeterminate group of calcification. 12/27/2020, left breast upper outer stereotactic biopsy showed high-grade DCIS, comedo type with associated calcifications. 02/04/2021, patient had left breast lumpectomy which showed focal residual DCIS, low-grade.  No evidence of invasive carcinoma.  All margins were negative for DCIS.  pTis,pNx, estrogen receptor positive,> 90%  Patient was seen by radiation oncology Dr. Huey Bienenstock and she received adjuvant radiation. Patient was referred to establish care with oncology for evaluation.  Patient was accompanied by daughter.  Patient has a history of arthritis, asthma, CKD, diabetes, GERD, hypertension and obesity.  Family history is negative for breast cancer.  Positive for lung cancer in sister.   INTERVAL HISTORY Alexis Greer is a 82 y.o. female who has above history reviewed by me today presents for follow up visit for left breast high-grade DCIS [2022], right breast invasive carcinoma [2016] Patient reports feeling well.  She takes Arimidex 1 mg daily.  Overall tolerates well with some side effects.  Chronic joint pain.  Patient has no new complaints today.  Review of Systems  Constitutional:  Positive for fatigue. Negative for appetite change, chills and fever.  HENT:   Negative for hearing loss and voice change.   Eyes:  Negative for eye problems.  Respiratory:  Negative for chest tightness and cough.   Cardiovascular:  Negative for chest pain.  Gastrointestinal:  Negative for abdominal distention, abdominal pain and blood in stool.  Endocrine: Negative for hot flashes.   Genitourinary:  Negative for difficulty urinating and frequency.   Musculoskeletal:  Positive for arthralgias.  Skin:  Negative for itching and rash.  Neurological:  Negative for extremity weakness.  Hematological:  Negative for adenopathy.  Psychiatric/Behavioral:  Negative for confusion.     MEDICAL HISTORY:  Past Medical History:  Diagnosis Date   Anemia    Arthritis    Asthma    B12 deficiency    Breast cancer ([place]) 11/17/2014   right breast lumpectomy, triple negative, with mammosite tx. Chemo with held due to CVA after lumpectomy   CKD (chronic kidney disease), stage IV ([place])    Complication of anesthesia 2016   had a stroke during the breast lumpectomy   Diabetes mellitus without complication (HCC)    Dyspnea    GERD (gastroesophageal reflux disease)    Gout    History of  hiatal hernia    Hyperlipidemia    Hyperparathyroidism ([place])    Hypertension    Malignant neoplasm ([place])    Morbid obesity ([place])    Personal history of radiation therapy 2016    mammosite radiation-RIGHT lumpectomy   Pneumonia    Sleep apnea    Stroke ([place]) 2016   post lumpectomy   Vitamin D deficiency     SURGICAL HISTORY: Past Surgical History:  Procedure Laterality Date   ABDOMINAL HYSTERECTOMY  1979   BREAST BIOPSY Right 11/17/2014   Invasive mammory carcinoma   BREAST BIOPSY Right 12/01/2014   Procedure: BREAST BIOPSY WITH NEEDLE LOCALIZATION;  Surgeon: Christene Lye, MD;  Location: [facility] ORS;  Service: General;  Laterality: Right;   BREAST BIOPSY Left 12/27/2020   Affirm bx-asymmetry/calcs "Ribbon" clip-path pending   BREAST BIOPSY Right 12/27/2021   [place] bx, coil clip, path pending   BREAST LUMPECTOMY Right 2016   INVASIVE MAMMARY CARCINOMA    BREAST LUMPECTOMY WITH SENTINEL LYMPH NODE BIOPSY Right 12/01/2014   Procedure: BREAST LUMPECTOMY WITH SENTINEL LYMPH NODE BX;  Surgeon: Christene Lye, MD;  Location: [facility] ORS;  Service: General;  Laterality: Right;    CHOLECYSTECTOMY  2020   COLONOSCOPY WITH PROPOFOL N/A 06/13/2016   Procedure: COLONOSCOPY WITH PROPOFOL;  Surgeon: Lollie Sails, MD;  Location: [facility] ENDOSCOPY;  Service: Endoscopy;  Laterality: N/A;   COLONOSCOPY WITH PROPOFOL N/A 12/03/2019   Procedure: COLONOSCOPY WITH PROPOFOL;  Surgeon: Toledo, Benay Pike, MD;  Location: [facility] ENDOSCOPY;  Service: Gastroenterology;  Laterality: N/A;   EYE SURGERY Bilateral    CATARACT REMOVAL   LOWER EXTREMITY INTERVENTION Left 11/09/2016   Procedure: LOWER EXTREMITY INTERVENTION;  Surgeon: Algernon Huxley, MD;  Location: [facility];  Service: Cardiovascular;  Laterality: Left;   MASTECTOMY Right    lumpectomy    OOPHORECTOMY  2014   PARATHYROIDECTOMY N/A 07/14/2020   Procedure: PARATHYROIDECTOMY, RNFA to assist;  Surgeon: Fredirick Maudlin, MD;  Location: [facility] ORS;  Service: General;  Laterality: N/A;   PARTIAL MASTECTOMY WITH NEEDLE LOCALIZATION Left 02/04/2021   Procedure: PARTIAL MASTECTOMY WITH NEEDLE LOCALIZATION;  Surgeon: Herbert Pun, MD;  Location: [facility] ORS;  Service: General;  Laterality: Left;   TONSILLECTOMY     XI ROBOTIC ASSISTED VENTRAL HERNIA N/A 03/17/2022   Procedure: XI ROBOTIC ASSISTED VENTRAL HERNIA;  Surgeon: Herbert Pun, MD;  Location: [facility] ORS;  Service: General;  Laterality: N/A;    SOCIAL HISTORY: Social History   Socioeconomic History   Marital status: Divorced    Spouse name: Not on file   Number of children: 2   Years of education: 12   Highest education level: High school graduate  Occupational History   Not on file  Tobacco Use   Smoking status: Former    Types: Cigarettes    Quit date: 08/25/1993    Years since quitting: 28.8   Smokeless tobacco: Never  Vaping Use   Vaping Use: Never used  Substance and Sexual Activity   Alcohol use: No    Alcohol/week: 0.0 standard drinks of alcohol   Drug use: No   Sexual activity: Not Currently  Other Topics Concern   Not on file  Social  History Narrative   Patient is a Sales promotion account executive Witness   Social Determinants of Health   Financial Resource Strain: Low Risk  (10/30/2018)   Overall Financial Resource Strain (CARDIA)    Difficulty of Paying Living Expenses: Not hard at all  Food Insecurity: No Food Insecurity (02/24/2022)  Hunger Vital Sign    Worried About Running Out of Food in the Last Year: Never true    Ran Out of Food in the Last Year: Never true  Transportation Needs: No Transportation Needs (02/24/2022)   PRAPARE - Hydrologist (Medical): No    Lack of Transportation (Non-Medical): No  Physical Activity: Inactive (10/30/2018)   Exercise Vital Sign    Days of Exercise per Week: 0 days    Minutes of Exercise per Session: 0 min  Stress: No Stress Concern Present (10/30/2018)   [place]    Feeling of Stress : Only a little  Social Connections: Moderately Integrated (10/30/2018)   Social Connection and Isolation Panel [NHANES]    Frequency of Communication with Friends and Family: More than three times a week    Frequency of Social Gatherings with Friends and Family: More than three times a week    Attends Religious Services: More than 4 times per year    Active Member of [employer] or Organizations: Yes    Attends Music therapist: More than 4 times per year    Marital Status: Divorced  Intimate Partner Violence: Not At Risk (10/30/2018)   Humiliation, Afraid, Rape, and Kick questionnaire    Fear of Current or Ex-Partner: No    Emotionally Abused: No    Physically Abused: No    Sexually Abused: No    FAMILY HISTORY: Family History  Problem Relation Age of Onset   CAD Father    Lung cancer Sister        smoker   Thyroid disease Daughter    Thyroid disease Daughter    Breast cancer Neg Hx     ALLERGIES:  is allergic to oxycodone.  MEDICATIONS:  Current Outpatient Medications  Medication Sig Dispense Refill    ACCU-CHEK AVIVA PLUS test strip      acetaminophen (TYLENOL) 325 MG tablet Take 650 mg by mouth every 6 (six) hours as needed for moderate pain or headache.     allopurinol (ZYLOPRIM) 100 MG tablet Take 100 mg by mouth 2 (two) times daily.      amLODipine (NORVASC) 10 MG tablet Take 10 mg by mouth daily.     anastrozole (ARIMIDEX) 1 MG tablet TAKE 1 TABLET EVERY DAY 90 tablet 3   aspirin EC 81 MG tablet Take 81 mg by mouth daily.     atorvastatin (LIPITOR) 40 MG tablet Take 40 mg by mouth daily at 6 PM.     Cholecalciferol (VITAMIN D) 125 MCG (5000 UT) CAPS Take 5,000 Units by mouth daily.     Collagen Hydrolysate POWD 1 Scoop by Does not apply route daily.     diphenhydrAMINE (BENADRYL) 25 MG tablet Take 50 mg by mouth every 6 (six) hours as needed for allergies.     DROPLET PEN NEEDLES 31G X 5 MM MISC      ezetimibe (ZETIA) 10 MG tablet Take 10 mg by mouth daily.     Insulin Isophane & Regular Human (NOVOLIN 70/30 FLEXPEN) (70-30) 100 UNIT/ML PEN Inject 5 Units into the skin 2 (two) times daily. (Patient taking differently: Inject 22-38 Units into the skin See admin instructions. Inject 22 units in the morning with breakfast and 38 units in the evening with dinner (half the dose if blood sugar is under 70)) 15 mL 11   losartan (COZAAR) 25 MG tablet Take 25 mg by mouth 2 (two) times daily.  magnesium oxide (MAG-OX) 400 MG tablet Take 800 mg by mouth 2 (two) times daily.     metoprolol succinate (TOPROL-XL) 50 MG 24 hr tablet Take 100 mg by mouth daily.     Multiple Vitamins-Minerals (MULTIVITAMIN WITH MINERALS) tablet Take 1 tablet by mouth daily.     omeprazole (PRILOSEC) 20 MG capsule Take 20 mg by mouth 2 (two) times daily.     torsemide (DEMADEX) 20 MG tablet TAKE 1 TABLET BY MOUTH ONCE DAILY AS DIRECTED FOR LEG EDEMA     traMADol (ULTRAM) 50 MG tablet Take 1 tablet (50 mg total) by mouth every 6 (six) hours as needed. 20 tablet 0   vitamin B-12 (CYANOCOBALAMIN) 1000 MCG tablet Take  1,000 mcg by mouth daily.     vitamin E 400 UNIT capsule Take 400 Units by mouth daily.      No current facility-administered medications for this visit.     PHYSICAL EXAMINATION: ECOG PERFORMANCE STATUS: 1 - Symptomatic but completely ambulatory Vitals:   06/15/22 0952 06/15/22 0958  BP: (!) 149/65 133/62  Pulse: 80 79  Resp: 18   Temp: (!) 97.4 F (36.3 C)   SpO2: 98%    Filed Weights   06/15/22 0952  Weight: 228 lb 12.8 oz (103.8 kg)    Physical Exam Constitutional:      General: She is not in acute distress.    Appearance: She is obese.  HENT:     Head: Normocephalic and atraumatic.  Eyes:     General: No scleral icterus. Cardiovascular:     Rate and Rhythm: Normal rate and regular rhythm.     Heart sounds: Normal heart sounds.  Pulmonary:     Effort: Pulmonary effort is normal. No respiratory distress.     Breath sounds: No wheezing.  Abdominal:     General: Bowel sounds are normal. There is no distension.     Palpations: Abdomen is soft.  Musculoskeletal:        General: No deformity. Normal range of motion.     Cervical back: Normal range of motion and neck supple.  Skin:    General: Skin is warm and dry.     Findings: No erythema or rash.  Neurological:     Mental Status: She is alert and oriented to person, place, and time. Mental status is at baseline.     Cranial Nerves: No cranial nerve deficit.     Coordination: Coordination normal.  Psychiatric:        Mood and Affect: Mood normal.     LABORATORY DATA:  I have reviewed the data as listed    Latest Ref Rng & Units 06/15/2022    9:35 AM 03/17/2022    6:47 AM 12/16/2021   10:01 AM  CBC  WBC 4.0 - 10.5 K/uL 6.0   6.2   Hemoglobin 12.0 - 15.0 g/dL 10.6  11.2  11.2   Hematocrit 36.0 - 46.0 % 33.7  33.0  35.3   Platelets 150 - 400 K/uL 238   269       Latest Ref Rng & Units 06/15/2022    9:35 AM 03/17/2022    7:02 AM 03/17/2022    6:47 AM  CMP  Glucose 70 - 99 mg/dL 151   141   BUN 8 - 23  mg/dL 48   64   Creatinine 0.44 - 1.00 mg/dL 2.51   2.60   Sodium 135 - 145 mmol/L 137   139   Potassium 3.5 -  5.1 mmol/L 4.8  4.4  6.2   Chloride 98 - 111 mmol/L 108   115   CO2 22 - 32 mmol/L 22     Calcium 8.9 - 10.3 mg/dL 8.9     Total Protein 6.5 - 8.1 g/dL 7.8     Total Bilirubin 0.3 - 1.2 mg/dL 0.6     Alkaline Phos 38 - 126 U/L 104     AST 15 - 41 U/L 44     ALT 0 - 44 U/L 20       RADIOGRAPHIC STUDIES: I have personally reviewed the radiological images as listed and agreed with the findings in the report. No results found.

## 2022-07-12 DIAGNOSIS — K219 Gastro-esophageal reflux disease without esophagitis: Secondary | ICD-10-CM | POA: Diagnosis not present

## 2022-07-12 DIAGNOSIS — Z0000 Encounter for general adult medical examination without abnormal findings: Secondary | ICD-10-CM | POA: Diagnosis not present

## 2022-07-12 DIAGNOSIS — D631 Anemia in chronic kidney disease: Secondary | ICD-10-CM | POA: Diagnosis not present

## 2022-07-12 DIAGNOSIS — E785 Hyperlipidemia, unspecified: Secondary | ICD-10-CM | POA: Diagnosis not present

## 2022-07-12 DIAGNOSIS — E1122 Type 2 diabetes mellitus with diabetic chronic kidney disease: Secondary | ICD-10-CM | POA: Diagnosis not present

## 2022-07-12 DIAGNOSIS — M109 Gout, unspecified: Secondary | ICD-10-CM | POA: Diagnosis not present

## 2022-07-12 DIAGNOSIS — Z1331 Encounter for screening for depression: Secondary | ICD-10-CM | POA: Diagnosis not present

## 2022-07-12 DIAGNOSIS — E669 Obesity, unspecified: Secondary | ICD-10-CM | POA: Diagnosis not present

## 2022-07-12 DIAGNOSIS — Z794 Long term (current) use of insulin: Secondary | ICD-10-CM | POA: Diagnosis not present

## 2022-07-12 DIAGNOSIS — Z853 Personal history of malignant neoplasm of breast: Secondary | ICD-10-CM | POA: Diagnosis not present

## 2022-07-12 DIAGNOSIS — N189 Chronic kidney disease, unspecified: Secondary | ICD-10-CM | POA: Diagnosis not present

## 2022-08-10 DIAGNOSIS — R809 Proteinuria, unspecified: Secondary | ICD-10-CM | POA: Diagnosis not present

## 2022-08-10 DIAGNOSIS — R600 Localized edema: Secondary | ICD-10-CM | POA: Diagnosis not present

## 2022-08-10 DIAGNOSIS — E1122 Type 2 diabetes mellitus with diabetic chronic kidney disease: Secondary | ICD-10-CM | POA: Diagnosis not present

## 2022-08-10 DIAGNOSIS — I10 Essential (primary) hypertension: Secondary | ICD-10-CM | POA: Diagnosis not present

## 2022-08-10 DIAGNOSIS — N2581 Secondary hyperparathyroidism of renal origin: Secondary | ICD-10-CM | POA: Diagnosis not present

## 2022-08-10 DIAGNOSIS — N184 Chronic kidney disease, stage 4 (severe): Secondary | ICD-10-CM | POA: Diagnosis not present

## 2022-08-15 DIAGNOSIS — H04123 Dry eye syndrome of bilateral lacrimal glands: Secondary | ICD-10-CM | POA: Diagnosis not present

## 2022-08-15 DIAGNOSIS — Z135 Encounter for screening for eye and ear disorders: Secondary | ICD-10-CM | POA: Diagnosis not present

## 2022-08-15 DIAGNOSIS — H52223 Regular astigmatism, bilateral: Secondary | ICD-10-CM | POA: Diagnosis not present

## 2022-08-15 DIAGNOSIS — Z961 Presence of intraocular lens: Secondary | ICD-10-CM | POA: Diagnosis not present

## 2022-08-15 DIAGNOSIS — H524 Presbyopia: Secondary | ICD-10-CM | POA: Diagnosis not present

## 2022-08-17 DIAGNOSIS — R809 Proteinuria, unspecified: Secondary | ICD-10-CM | POA: Diagnosis not present

## 2022-08-17 DIAGNOSIS — I10 Essential (primary) hypertension: Secondary | ICD-10-CM | POA: Diagnosis not present

## 2022-08-17 DIAGNOSIS — N184 Chronic kidney disease, stage 4 (severe): Secondary | ICD-10-CM | POA: Diagnosis not present

## 2022-08-17 DIAGNOSIS — R600 Localized edema: Secondary | ICD-10-CM | POA: Diagnosis not present

## 2022-08-17 DIAGNOSIS — E1122 Type 2 diabetes mellitus with diabetic chronic kidney disease: Secondary | ICD-10-CM | POA: Diagnosis not present

## 2022-08-24 DIAGNOSIS — E1122 Type 2 diabetes mellitus with diabetic chronic kidney disease: Secondary | ICD-10-CM | POA: Diagnosis not present

## 2022-08-24 DIAGNOSIS — N184 Chronic kidney disease, stage 4 (severe): Secondary | ICD-10-CM | POA: Diagnosis not present

## 2022-08-24 DIAGNOSIS — Z794 Long term (current) use of insulin: Secondary | ICD-10-CM | POA: Diagnosis not present

## 2022-08-31 DIAGNOSIS — N184 Chronic kidney disease, stage 4 (severe): Secondary | ICD-10-CM | POA: Diagnosis not present

## 2022-08-31 DIAGNOSIS — E1122 Type 2 diabetes mellitus with diabetic chronic kidney disease: Secondary | ICD-10-CM | POA: Diagnosis not present

## 2022-08-31 DIAGNOSIS — I10 Essential (primary) hypertension: Secondary | ICD-10-CM | POA: Diagnosis not present

## 2022-08-31 DIAGNOSIS — R809 Proteinuria, unspecified: Secondary | ICD-10-CM | POA: Diagnosis not present

## 2022-08-31 DIAGNOSIS — E1129 Type 2 diabetes mellitus with other diabetic kidney complication: Secondary | ICD-10-CM | POA: Diagnosis not present

## 2022-08-31 DIAGNOSIS — Z794 Long term (current) use of insulin: Secondary | ICD-10-CM | POA: Diagnosis not present

## 2022-12-12 ENCOUNTER — Ambulatory Visit
Admission: RE | Admit: 2022-12-12 | Discharge: 2022-12-12 | Disposition: A | Discharge status: Home / Self Care | Payer: Medicare HMO | Source: Ambulatory Visit | Attending: Oncology

## 2022-12-12 ENCOUNTER — Ambulatory Visit
Admission: RE | Admit: 2022-12-12 | Discharge: 2022-12-12 | Disposition: A | Discharge status: Home / Self Care | Payer: Medicare HMO | Source: Ambulatory Visit | Attending: Oncology | Admitting: Oncology

## 2022-12-12 DIAGNOSIS — R92313 Mammographic fatty tissue density, bilateral breasts: Secondary | ICD-10-CM | POA: Diagnosis not present

## 2022-12-12 DIAGNOSIS — D0512 Intraductal carcinoma in situ of left breast: Secondary | ICD-10-CM | POA: Diagnosis not present

## 2022-12-19 DIAGNOSIS — C50211 Malignant neoplasm of upper-inner quadrant of right female breast: Secondary | ICD-10-CM | POA: Diagnosis not present

## 2022-12-20 DIAGNOSIS — E1122 Type 2 diabetes mellitus with diabetic chronic kidney disease: Secondary | ICD-10-CM | POA: Diagnosis not present

## 2022-12-20 DIAGNOSIS — N184 Chronic kidney disease, stage 4 (severe): Secondary | ICD-10-CM | POA: Diagnosis not present

## 2022-12-20 DIAGNOSIS — R809 Proteinuria, unspecified: Secondary | ICD-10-CM | POA: Diagnosis not present

## 2022-12-20 DIAGNOSIS — R600 Localized edema: Secondary | ICD-10-CM | POA: Diagnosis not present

## 2022-12-20 DIAGNOSIS — I10 Essential (primary) hypertension: Secondary | ICD-10-CM | POA: Diagnosis not present

## 2022-12-21 ENCOUNTER — Encounter: Payer: Self-pay | Admitting: Oncology

## 2022-12-21 ENCOUNTER — Inpatient Hospital Stay: Payer: Medicare HMO | Attending: Oncology

## 2022-12-21 ENCOUNTER — Inpatient Hospital Stay: Payer: Medicare HMO | Admitting: Oncology

## 2022-12-21 ENCOUNTER — Other Ambulatory Visit: Payer: Self-pay

## 2022-12-21 VITALS — BP 172/60 | HR 64 | Temp 96.6°F | Resp 18 | Wt 220.6 lb

## 2022-12-21 DIAGNOSIS — K219 Gastro-esophageal reflux disease without esophagitis: Secondary | ICD-10-CM | POA: Diagnosis not present

## 2022-12-21 DIAGNOSIS — D0512 Intraductal carcinoma in situ of left breast: Secondary | ICD-10-CM

## 2022-12-21 DIAGNOSIS — N189 Chronic kidney disease, unspecified: Secondary | ICD-10-CM

## 2022-12-21 DIAGNOSIS — Z79899 Other long term (current) drug therapy: Secondary | ICD-10-CM | POA: Diagnosis not present

## 2022-12-21 DIAGNOSIS — Z87891 Personal history of nicotine dependence: Secondary | ICD-10-CM | POA: Insufficient documentation

## 2022-12-21 DIAGNOSIS — D631 Anemia in chronic kidney disease: Secondary | ICD-10-CM | POA: Diagnosis not present

## 2022-12-21 DIAGNOSIS — M8580 Other specified disorders of bone density and structure, unspecified site: Secondary | ICD-10-CM

## 2022-12-21 DIAGNOSIS — E1122 Type 2 diabetes mellitus with diabetic chronic kidney disease: Secondary | ICD-10-CM | POA: Diagnosis not present

## 2022-12-21 DIAGNOSIS — Z853 Personal history of malignant neoplasm of breast: Secondary | ICD-10-CM

## 2022-12-21 DIAGNOSIS — E1159 Type 2 diabetes mellitus with other circulatory complications: Secondary | ICD-10-CM | POA: Diagnosis not present

## 2022-12-21 DIAGNOSIS — I129 Hypertensive chronic kidney disease with stage 1 through stage 4 chronic kidney disease, or unspecified chronic kidney disease: Secondary | ICD-10-CM | POA: Insufficient documentation

## 2022-12-21 DIAGNOSIS — Z801 Family history of malignant neoplasm of trachea, bronchus and lung: Secondary | ICD-10-CM | POA: Diagnosis not present

## 2022-12-21 DIAGNOSIS — Z79811 Long term (current) use of aromatase inhibitors: Secondary | ICD-10-CM | POA: Diagnosis not present

## 2022-12-21 LAB — RETIC PANEL
Immature Retic Fract: 16.1 % — ABNORMAL HIGH (ref 2.3–15.9)
RBC.: 3.65 MIL/uL — ABNORMAL LOW (ref 3.87–5.11)
Retic Count, Absolute: 63.9 10*3/uL (ref 19.0–186.0)
Retic Ct Pct: 1.8 % (ref 0.4–3.1)
Reticulocyte Hemoglobin: 32.3 pg (ref >27.9)

## 2022-12-21 LAB — CMP (CANCER CENTER ONLY)
ALT: 17 U/L (ref 0–44)
AST: 21 U/L (ref 15–41)
Albumin: 4 g/dL (ref 3.5–5.0)
Alkaline Phosphatase: 111 U/L (ref 38–126)
Anion gap: 11 (ref 5–15)
BUN: 43 mg/dL — ABNORMAL HIGH (ref 8–23)
CO2: 17 mmol/L — ABNORMAL LOW (ref 22–32)
Calcium: 8.9 mg/dL (ref 8.9–10.3)
Chloride: 108 mmol/L (ref 98–111)
Creatinine: 2.43 mg/dL — ABNORMAL HIGH (ref 0.44–1.00)
GFR, Estimated: 20 mL/min — ABNORMAL LOW (ref >60)
Glucose, Bld: 135 mg/dL — ABNORMAL HIGH (ref 70–99)
Potassium: 5.1 mmol/L (ref 3.5–5.1)
Sodium: 136 mmol/L (ref 135–145)
Total Bilirubin: 0.6 mg/dL (ref 0.3–1.2)
Total Protein: 7.1 g/dL (ref 6.5–8.1)

## 2022-12-21 LAB — CBC WITH DIFFERENTIAL (CANCER CENTER ONLY)
Abs Immature Granulocytes: 0.05 10*3/uL (ref 0.00–0.07)
Basophils Absolute: 0.1 10*3/uL (ref 0.0–0.1)
Basophils Relative: 1 %
Eosinophils Absolute: 0.2 10*3/uL (ref 0.0–0.5)
Eosinophils Relative: 4 %
HCT: 34.5 % — ABNORMAL LOW (ref 36.0–46.0)
Hemoglobin: 10.9 g/dL — ABNORMAL LOW (ref 12.0–15.0)
Immature Granulocytes: 1 %
Lymphocytes Relative: 31 %
Lymphs Abs: 1.6 10*3/uL (ref 0.7–4.0)
MCH: 29.5 pg (ref 26.0–34.0)
MCHC: 31.6 g/dL (ref 30.0–36.0)
MCV: 93.5 fL (ref 80.0–100.0)
Monocytes Absolute: 0.7 10*3/uL (ref 0.1–1.0)
Monocytes Relative: 13 %
Neutro Abs: 2.6 10*3/uL (ref 1.7–7.7)
Neutrophils Relative %: 50 %
Platelet Count: 257 10*3/uL (ref 150–400)
RBC: 3.69 MIL/uL — ABNORMAL LOW (ref 3.87–5.11)
RDW: 14.9 % (ref 11.5–15.5)
WBC Count: 5.1 10*3/uL (ref 4.0–10.5)
nRBC: 0 % (ref 0.0–0.2)

## 2022-12-21 LAB — IRON AND TIBC
Iron: 54 ug/dL (ref 28–170)
Saturation Ratios: 20 % (ref 10.4–31.8)
TIBC: 276 ug/dL (ref 250–450)
UIBC: 222 ug/dL

## 2022-12-21 LAB — FERRITIN: Ferritin: 39 ng/mL (ref 11–307)

## 2022-12-21 MED ORDER — ANASTROZOLE 1 MG PO TABS: 1.0000 mg | ORAL_TABLET | Freq: Every day | ORAL | 1 refills | Status: DC

## 2022-12-21 NOTE — Assessment & Plan Note (Addendum)
Left DCIS ER+,  S/p lumpectomy [12/27/2020] and adjuvant radiation. Labs reviewed and discussed with patient Continue Arimidex 1 mg daily. Patient previously declined genetic testing. Continue bilateral annual mammogram -September 2025

## 2022-12-21 NOTE — Assessment & Plan Note (Signed)
02/23/2022 DEXA showed osteopenia, 10 year risk of hip fracture 0.7.      10 year risk of any major fracture.  4.8% Recommend patient to continue calcium and vitamin D supplementation.

## 2022-12-21 NOTE — Assessment & Plan Note (Signed)
11/17/14 right breast, invasive carcinoma, T1a N0  ER-, PR-, HER2 2+, FISH - Continue imaging surveillance 12/27/2021 Right breast lesion 9mm,  US guided biopsy - organized fat necrosis no atypia or malignancy

## 2022-12-21 NOTE — Assessment & Plan Note (Addendum)
Lab Results  Component Value Date   HGB 10.9 (L) 12/21/2022   TIBC 276 12/21/2022   IRONPCTSAT 20 12/21/2022   FERRITIN 39 12/21/2022   Likely secondary to anemia in chronic kidney disease.   She is not interested in IV Vernofer.  Recommend patient to take Vitron C 1 tab daily.

## 2022-12-21 NOTE — Progress Notes (Signed)
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 Labs reviewed and discussed with patient Continue Arimidex 1 mg daily. Patient previously declined genetic testing. 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      10 year risk of any major fracture.  4.8% Recommend patient to continue calcium and vitamin D supplementation.  Anemia in chronic kidney disease (CKD) Lab Results  Component Value Date   HGB 10.9 (L) 12/21/2022   TIBC 276 12/21/2022   IRONPCTSAT 20 12/21/2022   FERRITIN 39 12/21/2022   Likely secondary to anemia in chronic kidney disease.   She is not interested in IV Vernofer.  Recommend patient to take Vitron C 1 tab daily.    Orders Placed This Encounter  Procedures   CBC with Differential (Cancer Center Only)    Standing Status:   Future    Standing Expiration Date:   12/21/2023   CMP (Cancer Center only)    Standing Status:   Future    Standing Expiration Date:   12/21/2023   Retic Panel    Standing Status:   Future    Standing Expiration Date:   12/21/2023   CMP (Cancer Center only)    Standing Status:   Future    Number of Occurrences:   1    Standing Expiration Date:   12/21/2023   Follow up in 6 months,  All questions were  answered. The patient knows to call the clinic with any problems, questions or concerns.  Rickard Patience, MD, PhD [facility] Hematology Oncology 12/21/2022     HISTORY OF PRESENTING ILLNESS:   Alexis Greer is a  82 y.o.  female with PMH listed below was seen in consultation at the request of  Dorothey Baseman, MD  for evaluation of breast cancer  Patient has a history of breast cancer in 2016.  Managed by surgery and radiation oncology. 11/17/2014, patient underwent right breast biopsy which showed invasive mammary carcinoma, grade 3, tumor spans up to 5 mm no special type, ER negative, PR negative, HER2 IHC equivocal 2+, FISH negative. Patient underwent right breast lumpectomy and sentinel lymph node biopsy.  No residual malignancy found on her lumpectomy specimen.  Sclerosing adenosis with apocrine metaplasia.  Stromal hemorrhage. She received radiation adjuvantly.  12/13/2020, bilateral breast screening mammogram showed left breast calcification warrants further evaluation.  Right breast no findings suspicious for malignancy. 12/20/2020, diagnostic left mammogram showed left breast upper outer quadrant indeterminate group of calcification. 12/27/2020, left breast upper outer stereotactic biopsy showed high-grade DCIS, comedo type with associated calcifications. 02/04/2021, patient had left breast lumpectomy which showed focal residual DCIS, low-grade.  No evidence of invasive carcinoma.  All margins were negative for DCIS.  pTis,pNx, estrogen receptor positive,> 90%  Patient was seen by radiation oncology Dr. Valentino Hue  and she received adjuvant radiation. Patient was referred to establish care with oncology for evaluation.  Patient was accompanied by daughter.  Patient has a history of arthritis, asthma, CKD, diabetes, GERD, hypertension and obesity.  Family history is negative for breast cancer.  Positive for lung cancer in sister.   INTERVAL HISTORY Alexis Greer is a 82 y.o.  female who has above history reviewed by me today presents for follow up visit for left breast high-grade DCIS [2022], right breast invasive carcinoma [2016] Patient reports feeling well.  She takes Arimidex 1 mg daily.   Overall tolerates well with some side effects.  Chronic joint pain.  Patient has no new complaints today.  Review of Systems  Constitutional:  Positive for fatigue. Negative for appetite change, chills and fever.  HENT:   Negative for hearing loss and voice change.   Eyes:  Negative for eye problems.  Respiratory:  Negative for chest tightness and cough.   Cardiovascular:  Negative for chest pain.  Gastrointestinal:  Negative for abdominal distention, abdominal pain and blood in stool.  Endocrine: Negative for hot flashes.  Genitourinary:  Negative for difficulty urinating and frequency.   Musculoskeletal:  Positive for arthralgias.  Skin:  Negative for itching and rash.  Neurological:  Negative for extremity weakness.  Hematological:  Negative for adenopathy.  Psychiatric/Behavioral:  Negative for confusion.     MEDICAL HISTORY:  Past Medical History:  Diagnosis Date   Anemia    Arthritis    Asthma    B12 deficiency    Breast cancer (HCC) 11/17/2014   right breast lumpectomy, triple negative, with mammosite tx. Chemo with held due to CVA after lumpectomy   CKD (chronic kidney disease), stage IV (HCC)    Complication of anesthesia 2016   had a stroke during the breast lumpectomy   Diabetes mellitus without complication (HCC)    Dyspnea    GERD (gastroesophageal reflux disease)    Gout    History of hiatal hernia    Hyperlipidemia    Hyperparathyroidism (HCC)    Hypertension    Malignant neoplasm (HCC)    Morbid obesity (HCC)    Personal history of radiation therapy 2016    mammosite radiation-RIGHT lumpectomy   Pneumonia    Sleep apnea    Stroke (HCC) 2016   post lumpectomy   Vitamin D deficiency     SURGICAL HISTORY: Past Surgical History:   Procedure Laterality Date   ABDOMINAL HYSTERECTOMY  1979   BREAST BIOPSY Right 11/17/2014   Invasive mammory carcinoma   BREAST BIOPSY Right 12/01/2014   Procedure: BREAST BIOPSY WITH NEEDLE LOCALIZATION;  Surgeon: Kieth Brightly, MD;  Location: [facility] ORS;  Service: General;  Laterality: Right;   BREAST BIOPSY Left 12/27/2020   Affirm bx-asymmetry/calcs "Ribbon" clip-path pending   BREAST BIOPSY Right 12/27/2021   [place] bx, coil clip, path pending   BREAST LUMPECTOMY Right 2016   INVASIVE MAMMARY CARCINOMA    BREAST LUMPECTOMY WITH SENTINEL LYMPH NODE BIOPSY Right 12/01/2014   Procedure: BREAST LUMPECTOMY WITH SENTINEL LYMPH NODE BX;  Surgeon: Kieth Brightly, MD;  Location: [facility] ORS;  Service: General;  Laterality: Right;   CHOLECYSTECTOMY  2020   COLONOSCOPY WITH PROPOFOL N/A 06/13/2016   Procedure: COLONOSCOPY WITH PROPOFOL;  Surgeon: Christena Deem, MD;  Location: [facility] ENDOSCOPY;  Service: Endoscopy;  Laterality: N/A;   COLONOSCOPY WITH PROPOFOL N/A 12/03/2019   Procedure: COLONOSCOPY WITH PROPOFOL;  Surgeon: Toledo, Boykin Nearing, MD;  Location: [facility] ENDOSCOPY;  Service:  Gastroenterology;  Laterality: N/A;   EYE SURGERY Bilateral    CATARACT REMOVAL   LOWER EXTREMITY INTERVENTION Left 11/09/2016   Procedure: LOWER EXTREMITY INTERVENTION;  Surgeon: Annice Needy, MD;  Location: [facility] INVASIVE CV LAB;  Service: Cardiovascular;  Laterality: Left;   MASTECTOMY Right    lumpectomy    OOPHORECTOMY  2014   PARATHYROIDECTOMY N/A 07/14/2020   Procedure: PARATHYROIDECTOMY, RNFA to assist;  Surgeon: Duanne Guess, MD;  Location: [facility] ORS;  Service: General;  Laterality: N/A;   PARTIAL MASTECTOMY WITH NEEDLE LOCALIZATION Left 02/04/2021   Procedure: PARTIAL MASTECTOMY WITH NEEDLE LOCALIZATION;  Surgeon: Carolan Shiver, MD;  Location: [facility] ORS;  Service: General;  Laterality: Left;   TONSILLECTOMY     XI ROBOTIC ASSISTED VENTRAL HERNIA N/A 03/17/2022   Procedure: XI ROBOTIC  ASSISTED VENTRAL HERNIA;  Surgeon: Carolan Shiver, MD;  Location: [facility] ORS;  Service: General;  Laterality: N/A;    SOCIAL HISTORY: Social History   Socioeconomic History   Marital status: Divorced    Spouse name: Not on file   Number of children: 2   Years of education: 12   Highest education level: High school graduate  Occupational History   Not on file  Tobacco Use   Smoking status: Former    Current packs/day: 0.00    Types: Cigarettes    Quit date: 08/25/1993    Years since quitting: 29.3   Smokeless tobacco: Never  Vaping Use   Vaping status: Never Used  Substance and Sexual Activity   Alcohol use: No    Alcohol/week: 0.0 standard drinks of alcohol   Drug use: No   Sexual activity: Not Currently  Other Topics Concern   Not on file  Social History Narrative   Patient is a TEFL teacher Witness   Social Determinants of Health   Financial Resource Strain: Low Risk  (07/12/2022)   Received from [facility]   Overall Financial Resource Strain (CARDIA)    Difficulty of Paying Living Expenses: Not hard at all  Food Insecurity: No Food Insecurity (07/12/2022)   Received from [facility]   Hunger Vital Sign    Worried About Running Out of Food in the Last Year: Never true    Ran Out of Food in the Last Year: Never true  Transportation Needs: No Transportation Needs (07/12/2022)   Received from [facility] - Transportation    In the past 12 months, has lack of transportation kept you from medical appointments or from getting medications?: No    Lack of Transportation (Non-Medical): No  Physical Activity: Inactive (10/30/2018)   Exercise Vital Sign    Days of Exercise per Week: 0 days    Minutes of Exercise per Session: 0 min  Stress: No Stress Concern Present (10/30/2018)   [employer] of Occupational Health - Occupational Stress Questionnaire    Feeling of Stress : Only a little  Social Connections:  Moderately Integrated (10/30/2018)   Social Connection and Isolation Panel [NHANES]    Frequency of Communication with Friends and Family: More than three times a week    Frequency of Social Gatherings with Friends and Family: More than three times a week    Attends Religious Services: More than 4 times per year    Active Member of [employer] or Organizations: Yes    Attends Banker Meetings: More than 4 times per year    Marital Status: Divorced  Intimate Partner Violence: Not At Risk (10/30/2018)  Humiliation, Afraid, Rape, and Kick questionnaire    Fear of Current or Ex-Partner: No    Emotionally Abused: No    Physically Abused: No    Sexually Abused: No    FAMILY HISTORY: Family History  Problem Relation Age of Onset   CAD Father    Lung cancer Sister        smoker   Thyroid disease Daughter    Thyroid disease Daughter    Breast cancer Neg Hx     ALLERGIES:  is allergic to oxycodone.  MEDICATIONS:  Current Outpatient Medications  Medication Sig Dispense Refill   ACCU-CHEK AVIVA PLUS test strip      acetaminophen (TYLENOL) 325 MG tablet Take 650 mg by mouth every 6 (six) hours as needed for moderate pain or headache.     allopurinol (ZYLOPRIM) 100 MG tablet Take 100 mg by mouth 2 (two) times daily.      amLODipine (NORVASC) 10 MG tablet Take 10 mg by mouth daily.     aspirin EC 81 MG tablet Take 81 mg by mouth daily.     atorvastatin (LIPITOR) 40 MG tablet Take 40 mg by mouth daily at 6 PM.     Cholecalciferol (VITAMIN D) 125 MCG (5000 UT) CAPS Take 5,000 Units by mouth daily.     Collagen Hydrolysate POWD 1 Scoop by Does not apply route daily.     diphenhydrAMINE (BENADRYL) 25 MG tablet Take 50 mg by mouth every 6 (six) hours as needed for allergies.     DROPLET PEN NEEDLES 31G X 5 MM MISC      ezetimibe (ZETIA) 10 MG tablet Take 10 mg by mouth daily.     Insulin Isophane & Regular Human (NOVOLIN 70/30 FLEXPEN) (70-30) 100 UNIT/ML PEN Inject 5 Units into the skin 2  (two) times daily. (Patient taking differently: Inject 22-38 Units into the skin See admin instructions. Inject 22 units in the morning with breakfast and 38 units in the evening with dinner (half the dose if blood sugar is under 70)) 15 mL 11   losartan (COZAAR) 25 MG tablet Take 25 mg by mouth 2 (two) times daily.     magnesium oxide (MAG-OX) 400 MG tablet Take 800 mg by mouth 2 (two) times daily.     metoprolol succinate (TOPROL-XL) 50 MG 24 hr tablet Take 100 mg by mouth daily.     Multiple Vitamins-Minerals (MULTIVITAMIN WITH MINERALS) tablet Take 1 tablet by mouth daily.     omeprazole (PRILOSEC) 20 MG capsule Take 20 mg by mouth 2 (two) times daily.     torsemide (DEMADEX) 20 MG tablet TAKE 1 TABLET BY MOUTH ONCE DAILY AS DIRECTED FOR LEG EDEMA     traMADol (ULTRAM) 50 MG tablet Take 1 tablet (50 mg total) by mouth every 6 (six) hours as needed. 20 tablet 0   vitamin B-12 (CYANOCOBALAMIN) 1000 MCG tablet Take 1,000 mcg by mouth daily.     vitamin E 400 UNIT capsule Take 400 Units by mouth daily.      anastrozole (ARIMIDEX) 1 MG tablet Take 1 tablet (1 mg total) by mouth daily. 90 tablet 1   No current facility-administered medications for this visit.     PHYSICAL EXAMINATION: ECOG PERFORMANCE STATUS: 1 - Symptomatic but completely ambulatory Vitals:   12/21/22 0948 12/21/22 1002  BP: (!) 165/67 (!) 172/60  Pulse: 64   Resp: 18   Temp: (!) 96.6 F (35.9 C)   SpO2: 95%    Filed Weights  12/21/22 0948  Weight: 220 lb 9.6 oz (100.1 kg)    Physical Exam Constitutional:      General: She is not in acute distress.    Appearance: She is obese.  HENT:     Head: Normocephalic and atraumatic.  Eyes:     General: No scleral icterus. Cardiovascular:     Rate and Rhythm: Normal rate and regular rhythm.     Heart sounds: Normal heart sounds.  Pulmonary:     Effort: Pulmonary effort is normal. No respiratory distress.     Breath sounds: No wheezing.  Abdominal:     General:  Bowel sounds are normal. There is no distension.     Palpations: Abdomen is soft.  Musculoskeletal:        General: No deformity. Normal range of motion.     Cervical back: Normal range of motion and neck supple.  Skin:    General: Skin is warm and dry.     Findings: No erythema or rash.  Neurological:     Mental Status: She is alert and oriented to person, place, and time. Mental status is at baseline.     Cranial Nerves: No cranial nerve deficit.     Coordination: Coordination normal.  Psychiatric:        Mood and Affect: Mood normal.     LABORATORY DATA:  I have reviewed the data as listed    Latest Ref Rng & Units 12/21/2022    9:37 AM 06/15/2022    9:35 AM 03/17/2022    6:47 AM  CBC  WBC 4.0 - 10.5 K/uL 5.1  6.0    Hemoglobin 12.0 - 15.0 g/dL 16.1  09.6  04.5   Hematocrit 36.0 - 46.0 % 34.5  33.7  33.0   Platelets 150 - 400 K/uL 257  238        Latest Ref Rng & Units 12/21/2022    9:37 AM 06/15/2022    9:35 AM 03/17/2022    7:02 AM  CMP  Glucose 70 - 99 mg/dL 409  811    BUN 8 - 23 mg/dL 43  48    Creatinine 9.14 - 1.00 mg/dL 7.82  9.56    Sodium 213 - 145 mmol/L 136  137    Potassium 3.5 - 5.1 mmol/L 5.1  4.8  4.4   Chloride 98 - 111 mmol/L 108  108    CO2 22 - 32 mmol/L 17  22    Calcium 8.9 - 10.3 mg/dL 8.9  8.9    Total Protein 6.5 - 8.1 g/dL 7.1  7.8    Total Bilirubin 0.3 - 1.2 mg/dL 0.6  0.6    Alkaline Phos 38 - 126 U/L 111  104    AST 15 - 41 U/L 21  44    ALT 0 - 44 U/L 17  20      RADIOGRAPHIC STUDIES: I have personally reviewed the radiological images as listed and agreed with the findings in the report. MM 3D DIAGNOSTIC MAMMOGRAM BILATERAL BREAST  Result Date: 12/12/2022 CLINICAL DATA:  History of RIGHT lumpectomy 2016; history of LEFT lumpectomy in November 2022. EXAM: DIGITAL DIAGNOSTIC BILATERAL MAMMOGRAM WITH TOMOSYNTHESIS AND CAD TECHNIQUE: Bilateral digital diagnostic mammography and breast tomosynthesis was performed. The images were  evaluated with computer-aided detection. COMPARISON:  Previous exam(s). ACR Breast Density Category a: The breasts are almost entirely fatty. FINDINGS: Postoperative changes identified in the UPPER central RIGHT breast. Postoperative changes identified in the LATERAL portion of the LEFT  breast. No suspicious mass, distortion, or microcalcifications are identified to suggest presence of malignancy. IMPRESSION: No mammographic evidence for malignancy. RECOMMENDATION: Diagnostic mammogram is suggested in 1 year. (Code:[id number]-B-01Y) I have discussed the findings and recommendations with the patient. If applicable, a reminder letter will be sent to the patient regarding the next appointment. BI-RADS CATEGORY  2: Benign. Electronically Signed   By: Norva Pavlov M.D.   On: 12/12/2022 10:03

## 2022-12-26 ENCOUNTER — Other Ambulatory Visit: Payer: Self-pay

## 2022-12-26 ENCOUNTER — Telehealth: Payer: Self-pay

## 2022-12-26 MED ORDER — IRON-VITAMIN C 65-125 MG PO TABS: 1.0000 | ORAL_TABLET | Freq: Every day | ORAL | 1 refills | Status: AC

## 2022-12-26 NOTE — Telephone Encounter (Signed)
Patient called stating based on her last visit on 12/21/2022 she was under the impression that MD was sending in a prescription for Iron. Patient states her pharmacy does not have this prescription on file.

## 2022-12-27 DIAGNOSIS — E1122 Type 2 diabetes mellitus with diabetic chronic kidney disease: Secondary | ICD-10-CM | POA: Diagnosis not present

## 2022-12-27 DIAGNOSIS — N2581 Secondary hyperparathyroidism of renal origin: Secondary | ICD-10-CM | POA: Diagnosis not present

## 2022-12-27 DIAGNOSIS — R809 Proteinuria, unspecified: Secondary | ICD-10-CM | POA: Diagnosis not present

## 2022-12-27 DIAGNOSIS — I10 Essential (primary) hypertension: Secondary | ICD-10-CM | POA: Diagnosis not present

## 2022-12-27 DIAGNOSIS — N184 Chronic kidney disease, stage 4 (severe): Secondary | ICD-10-CM | POA: Diagnosis not present

## 2022-12-27 DIAGNOSIS — R600 Localized edema: Secondary | ICD-10-CM | POA: Diagnosis not present

## 2023-01-12 DIAGNOSIS — E1122 Type 2 diabetes mellitus with diabetic chronic kidney disease: Secondary | ICD-10-CM | POA: Diagnosis not present

## 2023-01-12 DIAGNOSIS — Z853 Personal history of malignant neoplasm of breast: Secondary | ICD-10-CM | POA: Diagnosis not present

## 2023-01-12 DIAGNOSIS — E782 Mixed hyperlipidemia: Secondary | ICD-10-CM | POA: Diagnosis not present

## 2023-01-12 DIAGNOSIS — Z87891 Personal history of nicotine dependence: Secondary | ICD-10-CM | POA: Diagnosis not present

## 2023-01-12 DIAGNOSIS — I129 Hypertensive chronic kidney disease with stage 1 through stage 4 chronic kidney disease, or unspecified chronic kidney disease: Secondary | ICD-10-CM | POA: Diagnosis not present

## 2023-01-12 DIAGNOSIS — Z794 Long term (current) use of insulin: Secondary | ICD-10-CM | POA: Diagnosis not present

## 2023-01-12 DIAGNOSIS — N1832 Chronic kidney disease, stage 3b: Secondary | ICD-10-CM | POA: Diagnosis not present

## 2023-01-17 ENCOUNTER — Other Ambulatory Visit: Payer: Self-pay | Admitting: Oncology

## 2023-03-06 DIAGNOSIS — Z794 Long term (current) use of insulin: Secondary | ICD-10-CM | POA: Diagnosis not present

## 2023-03-06 DIAGNOSIS — N184 Chronic kidney disease, stage 4 (severe): Secondary | ICD-10-CM | POA: Diagnosis not present

## 2023-03-06 DIAGNOSIS — Z780 Asymptomatic menopausal state: Secondary | ICD-10-CM | POA: Diagnosis not present

## 2023-03-06 DIAGNOSIS — R809 Proteinuria, unspecified: Secondary | ICD-10-CM | POA: Diagnosis not present

## 2023-03-06 DIAGNOSIS — E1129 Type 2 diabetes mellitus with other diabetic kidney complication: Secondary | ICD-10-CM | POA: Diagnosis not present

## 2023-03-06 DIAGNOSIS — N2581 Secondary hyperparathyroidism of renal origin: Secondary | ICD-10-CM | POA: Diagnosis not present

## 2023-03-06 DIAGNOSIS — E1122 Type 2 diabetes mellitus with diabetic chronic kidney disease: Secondary | ICD-10-CM | POA: Diagnosis not present

## 2023-03-06 DIAGNOSIS — I10 Essential (primary) hypertension: Secondary | ICD-10-CM | POA: Diagnosis not present

## 2023-04-25 DIAGNOSIS — E1122 Type 2 diabetes mellitus with diabetic chronic kidney disease: Secondary | ICD-10-CM | POA: Diagnosis not present

## 2023-04-25 DIAGNOSIS — N2581 Secondary hyperparathyroidism of renal origin: Secondary | ICD-10-CM | POA: Diagnosis not present

## 2023-04-25 DIAGNOSIS — I10 Essential (primary) hypertension: Secondary | ICD-10-CM | POA: Diagnosis not present

## 2023-04-25 DIAGNOSIS — R600 Localized edema: Secondary | ICD-10-CM | POA: Diagnosis not present

## 2023-04-25 DIAGNOSIS — R809 Proteinuria, unspecified: Secondary | ICD-10-CM | POA: Diagnosis not present

## 2023-04-25 DIAGNOSIS — N184 Chronic kidney disease, stage 4 (severe): Secondary | ICD-10-CM | POA: Diagnosis not present

## 2023-05-02 DIAGNOSIS — E1122 Type 2 diabetes mellitus with diabetic chronic kidney disease: Secondary | ICD-10-CM | POA: Diagnosis not present

## 2023-05-02 DIAGNOSIS — E1129 Type 2 diabetes mellitus with other diabetic kidney complication: Secondary | ICD-10-CM | POA: Diagnosis not present

## 2023-05-02 DIAGNOSIS — N184 Chronic kidney disease, stage 4 (severe): Secondary | ICD-10-CM | POA: Diagnosis not present

## 2023-05-02 DIAGNOSIS — I10 Essential (primary) hypertension: Secondary | ICD-10-CM | POA: Diagnosis not present

## 2023-05-02 DIAGNOSIS — R809 Proteinuria, unspecified: Secondary | ICD-10-CM | POA: Diagnosis not present

## 2023-05-02 DIAGNOSIS — N2581 Secondary hyperparathyroidism of renal origin: Secondary | ICD-10-CM | POA: Diagnosis not present

## 2023-06-20 ENCOUNTER — Ambulatory Visit: Payer: Medicare HMO | Admitting: Oncology

## 2023-06-20 ENCOUNTER — Other Ambulatory Visit: Payer: Medicare HMO

## 2023-06-26 ENCOUNTER — Inpatient Hospital Stay: Payer: Medicare HMO | Admitting: Oncology

## 2023-06-26 ENCOUNTER — Inpatient Hospital Stay: Payer: Medicare HMO | Attending: Oncology

## 2023-06-26 ENCOUNTER — Encounter: Payer: Self-pay | Admitting: Oncology

## 2023-06-26 VITALS — BP 130/60 | HR 72 | Temp 97.9°F | Resp 14 | Wt 217.9 lb

## 2023-06-26 DIAGNOSIS — N189 Chronic kidney disease, unspecified: Secondary | ICD-10-CM | POA: Diagnosis not present

## 2023-06-26 DIAGNOSIS — M8580 Other specified disorders of bone density and structure, unspecified site: Secondary | ICD-10-CM

## 2023-06-26 DIAGNOSIS — D0512 Intraductal carcinoma in situ of left breast: Secondary | ICD-10-CM | POA: Diagnosis not present

## 2023-06-26 DIAGNOSIS — Z87891 Personal history of nicotine dependence: Secondary | ICD-10-CM | POA: Insufficient documentation

## 2023-06-26 DIAGNOSIS — Z853 Personal history of malignant neoplasm of breast: Secondary | ICD-10-CM | POA: Insufficient documentation

## 2023-06-26 DIAGNOSIS — Z170 Estrogen receptor positive status [ER+]: Secondary | ICD-10-CM | POA: Diagnosis not present

## 2023-06-26 DIAGNOSIS — Z79899 Other long term (current) drug therapy: Secondary | ICD-10-CM | POA: Diagnosis not present

## 2023-06-26 DIAGNOSIS — D631 Anemia in chronic kidney disease: Secondary | ICD-10-CM

## 2023-06-26 DIAGNOSIS — Z801 Family history of malignant neoplasm of trachea, bronchus and lung: Secondary | ICD-10-CM | POA: Insufficient documentation

## 2023-06-26 DIAGNOSIS — Z79811 Long term (current) use of aromatase inhibitors: Secondary | ICD-10-CM | POA: Diagnosis not present

## 2023-06-26 DIAGNOSIS — Z9012 Acquired absence of left breast and nipple: Secondary | ICD-10-CM | POA: Insufficient documentation

## 2023-06-26 LAB — CMP (CANCER CENTER ONLY)
ALT: 20 U/L (ref 0–44)
AST: 25 U/L (ref 15–41)
Albumin: 3.9 g/dL (ref 3.5–5.0)
Alkaline Phosphatase: 105 U/L (ref 38–126)
Anion gap: 9 (ref 5–15)
BUN: 44 mg/dL — ABNORMAL HIGH (ref 8–23)
CO2: 20 mmol/L — ABNORMAL LOW (ref 22–32)
Calcium: 8.9 mg/dL (ref 8.9–10.3)
Chloride: 107 mmol/L (ref 98–111)
Creatinine: 2.37 mg/dL — ABNORMAL HIGH (ref 0.44–1.00)
GFR, Estimated: 20 mL/min — ABNORMAL LOW (ref >60)
Glucose, Bld: 155 mg/dL — ABNORMAL HIGH (ref 70–99)
Potassium: 5.1 mmol/L (ref 3.5–5.1)
Sodium: 136 mmol/L (ref 135–145)
Total Bilirubin: 0.5 mg/dL (ref 0.0–1.2)
Total Protein: 6.9 g/dL (ref 6.5–8.1)

## 2023-06-26 LAB — RETIC PANEL
Immature Retic Fract: 18.3 % — ABNORMAL HIGH (ref 2.3–15.9)
RBC.: 3.61 MIL/uL — ABNORMAL LOW (ref 3.87–5.11)
Retic Count, Absolute: 71.5 10*3/uL (ref 19.0–186.0)
Retic Ct Pct: 2 % (ref 0.4–3.1)
Reticulocyte Hemoglobin: 32.7 pg (ref >27.9)

## 2023-06-26 LAB — CBC WITH DIFFERENTIAL (CANCER CENTER ONLY)
Abs Immature Granulocytes: 0.01 10*3/uL (ref 0.00–0.07)
Basophils Absolute: 0.1 10*3/uL (ref 0.0–0.1)
Basophils Relative: 1 %
Eosinophils Absolute: 0.2 10*3/uL (ref 0.0–0.5)
Eosinophils Relative: 3 %
HCT: 34.2 % — ABNORMAL LOW (ref 36.0–46.0)
Hemoglobin: 10.9 g/dL — ABNORMAL LOW (ref 12.0–15.0)
Immature Granulocytes: 0 %
Lymphocytes Relative: 28 %
Lymphs Abs: 1.6 10*3/uL (ref 0.7–4.0)
MCH: 29.9 pg (ref 26.0–34.0)
MCHC: 31.9 g/dL (ref 30.0–36.0)
MCV: 93.7 fL (ref 80.0–100.0)
Monocytes Absolute: 0.7 10*3/uL (ref 0.1–1.0)
Monocytes Relative: 13 %
Neutro Abs: 3 10*3/uL (ref 1.7–7.7)
Neutrophils Relative %: 55 %
Platelet Count: 237 10*3/uL (ref 150–400)
RBC: 3.65 MIL/uL — ABNORMAL LOW (ref 3.87–5.11)
RDW: 15 % (ref 11.5–15.5)
WBC Count: 5.5 10*3/uL (ref 4.0–10.5)
nRBC: 0 % (ref 0.0–0.2)

## 2023-06-26 NOTE — Assessment & Plan Note (Addendum)
Left DCIS ER+,  S/p lumpectomy [12/27/2020] and adjuvant radiation. Labs reviewed and discussed with patient Continue Arimidex 1 mg daily. Patient previously declined genetic testing. Continue bilateral annual mammogram -September 2025

## 2023-06-26 NOTE — Progress Notes (Signed)
 Patient is doing well, no new questions or concerns for the doctor today. She is having a  little bit of knee pain, which she says is normal for her.

## 2023-06-26 NOTE — Assessment & Plan Note (Addendum)
 Lab Results  Component Value Date   HGB 10.9 (L) 06/26/2023   TIBC 276 12/21/2022   IRONPCTSAT 20 12/21/2022   FERRITIN 39 12/21/2022   Likely secondary to anemia in chronic kidney disease.   She is not interested in IV Vernofer.  Recommend patient to take Vitron C 1 tab daily.

## 2023-06-26 NOTE — Progress Notes (Signed)
 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 Labs reviewed and discussed with patient Continue Arimidex 1 mg daily. Patient previously declined genetic testing. Continue bilateral annual mammogram -September 2025  Anemia in chronic kidney disease (CKD) Lab Results  Component Value Date   HGB 10.9 (L) 06/26/2023   TIBC 276 12/21/2022   IRONPCTSAT 20 12/21/2022   FERRITIN 39 12/21/2022   Likely secondary to anemia in chronic kidney disease.   She is not interested in IV Vernofer.  Recommend patient to take Vitron C 1 tab daily.   Osteopenia 02/23/2022 DEXA showed osteopenia, 10 year risk of hip fracture 0.7.      10 year risk of any major fracture.  4.8% Recommend patient to continue calcium and vitamin D supplementation.  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, invasive carcinoma, T1a N0  ER-, PR-, HER2 2+, FISH - Continue imaging surveillance 0/05/2021 Right breast lesion 9mm,  US guided biopsy - organized fat necrosis no atypia or malignancy   Orders Placed This Encounter  Procedures   MM 3D DIAGNOSTIC MAMMOGRAM BILATERAL BREAST    Standing Status:   Future    Expected Date:   11/26/2023    Expiration Date:   06/25/2024    Reason for Exam (SYMPTOM  OR DIAGNOSIS REQUIRED):   DCIS left breast    Preferred imaging location?:   [facility]   [place] LIMITED ULTRASOUND INCLUDING AXILLA LEFT BREAST     Standing Status:   Future    Expected Date:   11/26/2023    Expiration Date:   06/25/2024    Reason for Exam (SYMPTOM  OR DIAGNOSIS REQUIRED):   DCIS left breast     Preferred imaging location?:   [facility]   [place] LIMITED ULTRASOUND INCLUDING AXILLA RIGHT BREAST    Standing Status:   Future    Expected Date:   11/26/2023    Expiration Date:   06/25/2024    Reason for Exam (SYMPTOM  OR DIAGNOSIS REQUIRED):   DCIS left breast    Preferred imaging location?:   [facility]   CMP (Cancer Center only)    Standing Status:   Future    Expected Date:   12/26/2023    Expiration Date:   06/25/2024   CBC with Differential (Cancer Center Only)    Standing Status:   Future    Expected Date:   12/26/2023    Expiration Date:   06/25/2024   Ferritin    Standing Status:   Future    Expected Date:   12/26/2023    Expiration Date:   06/25/2024   Retic Panel    Standing Status:   Future    Expected Date:   12/26/2023    Expiration Date:   06/25/2024   Iron and TIBC    Standing Status:   Future    Expected Date:   12/26/2023    Expiration Date:   06/25/2024   Follow up in 6 months,  All questions were answered. The patient knows to call the clinic with any problems, questions or concerns.  Rickard Patience, MD, PhD [facility] Hematology Oncology 06/26/2023     HISTORY OF PRESENTING ILLNESS:   Alexis Greer is a  83 y.o.  female with PMH listed below was seen in consultation at the request of  Dorothey Baseman, MD  for evaluation of breast cancer  Patient has a history of breast cancer in 2016.  Managed by surgery and radiation oncology. 11/17/2014, patient underwent right breast biopsy which showed invasive mammary carcinoma, grade 3, tumor spans up to 5 mm no special type, ER negative, PR negative, HER2 IHC equivocal 2+, FISH negative. Patient underwent right breast lumpectomy and sentinel lymph node biopsy.  No residual malignancy found on her lumpectomy specimen.  Sclerosing adenosis with apocrine metaplasia.  Stromal hemorrhage. She received radiation adjuvantly.  12/13/2020, bilateral breast screening mammogram showed left breast calcification warrants  further evaluation.  Right breast no findings suspicious for malignancy. 12/20/2020, diagnostic left mammogram showed left breast upper outer quadrant indeterminate group of calcification. 12/27/2020, left breast upper outer stereotactic biopsy showed high-grade DCIS, comedo type with associated calcifications. 02/04/2021, patient had left breast lumpectomy which showed focal residual DCIS, low-grade.  No evidence of invasive carcinoma.  All margins were negative for DCIS.  pTis,pNx, estrogen receptor positive,> 90%  Patient was seen by radiation oncology Dr. Valentino Hue and she received adjuvant radiation. Patient was referred to establish care with oncology for evaluation.  Patient was accompanied by daughter.  Patient has a history of arthritis, asthma, CKD, diabetes, GERD, hypertension and obesity.  Family history is negative for breast cancer.  Positive for lung cancer in sister.   INTERVAL HISTORY Alexis VANDERWEELE is a 83 y.o. female who has above history reviewed by me today presents for follow up visit for left breast high-grade DCIS [2022], right breast invasive carcinoma [2016] Patient reports feeling well.  She takes Arimidex 1 mg daily.   Overall tolerates well with some side effects.  Chronic joint pain.   Patient has no new complaints today.  Review of Systems  Constitutional:  Positive for fatigue. Negative for appetite change, chills and fever.  HENT:   Negative for hearing loss and voice change.   Eyes:  Negative for eye problems.  Respiratory:  Negative for chest tightness and cough.   Cardiovascular:  Negative for chest pain.  Gastrointestinal:  Negative for abdominal distention, abdominal pain and blood in stool.  Endocrine: Negative for hot flashes.  Genitourinary:  Negative for difficulty urinating and frequency.   Musculoskeletal:  Positive for arthralgias.  Skin:  Negative for itching and rash.  Neurological:  Negative for extremity weakness.  Hematological:   Negative for adenopathy.  Psychiatric/Behavioral:  Negative for confusion.     MEDICAL HISTORY:  Past Medical History:  Diagnosis Date   Anemia    Arthritis    Asthma    B12 deficiency    Breast cancer (HCC) 11/17/2014   right breast lumpectomy, triple negative, with mammosite tx. Chemo with held due to CVA after lumpectomy   CKD (chronic kidney disease), stage IV (HCC)    Complication of anesthesia 2016   had a stroke during the breast lumpectomy   Diabetes mellitus without complication (HCC)    Dyspnea    GERD (gastroesophageal reflux disease)    Gout    History of hiatal hernia    Hyperlipidemia    Hyperparathyroidism (HCC)    Hypertension    Malignant neoplasm (HCC)    Morbid obesity (HCC)    Personal history of radiation therapy 2016  mammosite radiation-RIGHT lumpectomy   Pneumonia    Sleep apnea    Stroke [facility]) 2016   post lumpectomy   Vitamin D deficiency     SURGICAL HISTORY: Past Surgical History:  Procedure Laterality Date   ABDOMINAL HYSTERECTOMY  1979   BREAST BIOPSY Right 11/17/2014   Invasive mammory carcinoma   BREAST BIOPSY Right 12/01/2014   Procedure: BREAST BIOPSY WITH NEEDLE LOCALIZATION;  Surgeon: Kieth Brightly, MD;  Location: [facility] ORS;  Service: General;  Laterality: Right;   BREAST BIOPSY Left 12/27/2020   Affirm bx-asymmetry/calcs "Ribbon" clip-path pending   BREAST BIOPSY Right 12/27/2021   [place] bx, coil clip, path pending   BREAST LUMPECTOMY Right 2016   INVASIVE MAMMARY CARCINOMA    BREAST LUMPECTOMY WITH SENTINEL LYMPH NODE BIOPSY Right 12/01/2014   Procedure: BREAST LUMPECTOMY WITH SENTINEL LYMPH NODE BX;  Surgeon: Kieth Brightly, MD;  Location: [facility] ORS;  Service: General;  Laterality: Right;   CHOLECYSTECTOMY  2020   COLONOSCOPY WITH PROPOFOL N/A 06/13/2016   Procedure: COLONOSCOPY WITH PROPOFOL;  Surgeon: Christena Deem, MD;  Location: [facility] ENDOSCOPY;  Service: Endoscopy;  Laterality: N/A;   COLONOSCOPY WITH  PROPOFOL N/A 12/03/2019   Procedure: COLONOSCOPY WITH PROPOFOL;  Surgeon: Toledo, Boykin Nearing, MD;  Location: [facility] ENDOSCOPY;  Service: Gastroenterology;  Laterality: N/A;   EYE SURGERY Bilateral    CATARACT REMOVAL   LOWER EXTREMITY INTERVENTION Left 11/09/2016   Procedure: LOWER EXTREMITY INTERVENTION;  Surgeon: Annice Needy, MD;  Location: [facility] INVASIVE CV LAB;  Service: Cardiovascular;  Laterality: Left;   MASTECTOMY Right    lumpectomy    OOPHORECTOMY  2014   PARATHYROIDECTOMY N/A 07/14/2020   Procedure: PARATHYROIDECTOMY, RNFA to assist;  Surgeon: Duanne Guess, MD;  Location: [facility] ORS;  Service: General;  Laterality: N/A;   PARTIAL MASTECTOMY WITH NEEDLE LOCALIZATION Left 02/04/2021   Procedure: PARTIAL MASTECTOMY WITH NEEDLE LOCALIZATION;  Surgeon: Carolan Shiver, MD;  Location: [facility] ORS;  Service: General;  Laterality: Left;   TONSILLECTOMY     XI ROBOTIC ASSISTED VENTRAL HERNIA N/A 03/17/2022   Procedure: XI ROBOTIC ASSISTED VENTRAL HERNIA;  Surgeon: Carolan Shiver, MD;  Location: [facility] ORS;  Service: General;  Laterality: N/A;    SOCIAL HISTORY: Social History   Socioeconomic History   Marital status: Divorced    Spouse name: Not on file   Number of children: 2   Years of education: 12   Highest education level: High school graduate  Occupational History   Not on file  Tobacco Use   Smoking status: Former    Current packs/day: 0.00    Types: Cigarettes    Quit date: 08/25/1993    Years since quitting: 29.8   Smokeless tobacco: Never  Vaping Use   Vaping status: Never Used  Substance and Sexual Activity   Alcohol use: No    Alcohol/week: 0.0 standard drinks of alcohol   Drug use: No   Sexual activity: Not Currently  Other Topics Concern   Not on file  Social History Narrative   Patient is a TEFL teacher Witness   Social Drivers of Corporate investment banker Strain: Low Risk  (07/12/2022)   Received from [facility]   Overall  Financial Resource Strain (CARDIA)    Difficulty of Paying Living Expenses: Not hard at all  Food Insecurity: No Food Insecurity (07/12/2022)   Received from [facility]   Hunger Vital Sign    Worried About Running Out of Food in the Last  Year: Never true    Ran Out of Food in the Last Year: Never true  Transportation Needs: No Transportation Needs (07/12/2022)   Received from [facility] - Transportation    In the past 12 months, has lack of transportation kept you from medical appointments or from getting medications?: No    Lack of Transportation (Non-Medical): No  Physical Activity: Inactive (10/30/2018)   Exercise Vital Sign    Days of Exercise per Week: 0 days    Minutes of Exercise per Session: 0 min  Stress: No Stress Concern Present (10/30/2018)   [employer] of Occupational Health - Occupational Stress Questionnaire    Feeling of Stress : Only a little  Social Connections: Moderately Integrated (10/30/2018)   Social Connection and Isolation Panel [NHANES]    Frequency of Communication with Friends and Family: More than three times a week    Frequency of Social Gatherings with Friends and Family: More than three times a week    Attends Religious Services: More than 4 times per year    Active Member of [employer] or Organizations: Yes    Attends Engineer, structural: More than 4 times per year    Marital Status: Divorced  Intimate Partner Violence: Not At Risk (10/30/2018)   Humiliation, Afraid, Rape, and Kick questionnaire    Fear of Current or Ex-Partner: No    Emotionally Abused: No    Physically Abused: No    Sexually Abused: No    FAMILY HISTORY: Family History  Problem Relation Age of Onset   CAD Father    Lung cancer Sister        smoker   Thyroid disease Daughter    Thyroid disease Daughter    Breast cancer Neg Hx     ALLERGIES:  is allergic to oxycodone.  MEDICATIONS:  Current Outpatient Medications   Medication Sig Dispense Refill   Iron-Vitamin C 65-125 MG TABS Take 1 tablet by mouth daily. 90 tablet 1   ACCU-CHEK AVIVA PLUS test strip      acetaminophen (TYLENOL) 325 MG tablet Take 650 mg by mouth every 6 (six) hours as needed for moderate pain or headache.     allopurinol (ZYLOPRIM) 100 MG tablet Take 100 mg by mouth 2 (two) times daily.      amLODipine (NORVASC) 10 MG tablet Take 10 mg by mouth daily.     anastrozole (ARIMIDEX) 1 MG tablet TAKE 1 TABLET EVERY DAY 90 tablet 3   aspirin EC 81 MG tablet Take 81 mg by mouth daily.     atorvastatin (LIPITOR) 40 MG tablet Take 40 mg by mouth daily at 6 PM.     Cholecalciferol (VITAMIN D) 125 MCG (5000 UT) CAPS Take 5,000 Units by mouth daily.     Collagen Hydrolysate POWD 1 Scoop by Does not apply route daily.     diphenhydrAMINE (BENADRYL) 25 MG tablet Take 50 mg by mouth every 6 (six) hours as needed for allergies.     DROPLET PEN NEEDLES 31G X 5 MM MISC      ezetimibe (ZETIA) 10 MG tablet Take 10 mg by mouth daily.     Insulin Isophane & Regular Human (NOVOLIN 70/30 FLEXPEN) (70-30) 100 UNIT/ML PEN Inject 5 Units into the skin 2 (two) times daily. (Patient taking differently: Inject 22-38 Units into the skin See admin instructions. Inject 22 units in the morning with breakfast and 38 units in the evening with dinner (half the dose if blood  sugar is under 70)) 15 mL 11   losartan (COZAAR) 25 MG tablet Take 25 mg by mouth 2 (two) times daily.     magnesium oxide (MAG-OX) 400 MG tablet Take 800 mg by mouth 2 (two) times daily.     metoprolol succinate (TOPROL-XL) 50 MG 24 hr tablet Take 100 mg by mouth daily.     Multiple Vitamins-Minerals (MULTIVITAMIN WITH MINERALS) tablet Take 1 tablet by mouth daily.     omeprazole (PRILOSEC) 20 MG capsule Take 20 mg by mouth 2 (two) times daily.     torsemide (DEMADEX) 20 MG tablet TAKE 1 TABLET BY MOUTH ONCE DAILY AS DIRECTED FOR LEG EDEMA     vitamin B-12 (CYANOCOBALAMIN) 1000 MCG tablet Take 1,000  mcg by mouth daily.     vitamin E 400 UNIT capsule Take 400 Units by mouth daily.      No current facility-administered medications for this visit.     PHYSICAL EXAMINATION: ECOG PERFORMANCE STATUS: 1 - Symptomatic but completely ambulatory Vitals:   06/26/23 0944  BP: 130/60  Pulse: 72  Resp: 14  Temp: 97.9 F (36.6 C)  SpO2: 100%   Filed Weights   06/26/23 0944  Weight: 217 lb 14.4 oz (98.8 kg)    Physical Exam Constitutional:      General: She is not in acute distress.    Appearance: She is obese.  HENT:     Head: Normocephalic and atraumatic.  Eyes:     General: No scleral icterus. Cardiovascular:     Rate and Rhythm: Normal rate and regular rhythm.     Heart sounds: Normal heart sounds.  Pulmonary:     Effort: Pulmonary effort is normal. No respiratory distress.     Breath sounds: No wheezing.  Abdominal:     General: Bowel sounds are normal. There is no distension.     Palpations: Abdomen is soft.  Musculoskeletal:        General: No deformity. Normal range of motion.     Cervical back: Normal range of motion and neck supple.  Skin:    General: Skin is warm and dry.     Findings: No erythema or rash.  Neurological:     Mental Status: She is alert and oriented to person, place, and time. Mental status is at baseline.  Psychiatric:        Mood and Affect: Mood normal.     LABORATORY DATA:  I have reviewed the data as listed    Latest Ref Rng & Units 06/26/2023    9:28 AM 12/21/2022    9:37 AM 06/15/2022    9:35 AM  CBC  WBC 4.0 - 10.5 K/uL 5.5  5.1  6.0   Hemoglobin 12.0 - 15.0 g/dL 95.6  21.3  08.6   Hematocrit 36.0 - 46.0 % 34.2  34.5  33.7   Platelets 150 - 400 K/uL 237  257  238       Latest Ref Rng & Units 06/26/2023    9:28 AM 12/21/2022    9:37 AM 06/15/2022    9:35 AM  CMP  Glucose 70 - 99 mg/dL 578  469  629   BUN 8 - 23 mg/dL 44  43  48   Creatinine 0.44 - 1.00 mg/dL 5.28  4.13  2.44   Sodium 135 - 145 mmol/L 136  136  137   Potassium  3.5 - 5.1 mmol/L 5.1  5.1  4.8   Chloride 98 - 111 mmol/L 107  108  108   CO2 22 - 32 mmol/L 20  17  22    Calcium 8.9 - 10.3 mg/dL 8.9  8.9  8.9   Total Protein 6.5 - 8.1 g/dL 6.9  7.1  7.8   Total Bilirubin 0.0 - 1.2 mg/dL 0.5  0.6  0.6   Alkaline Phos 38 - 126 U/L 105  111  104   AST 15 - 41 U/L 25  21  44   ALT 0 - 44 U/L 20  17  20      RADIOGRAPHIC STUDIES: I have personally reviewed the radiological images as listed and agreed with the findings in the report. No results found.

## 2023-06-26 NOTE — Assessment & Plan Note (Signed)
02/23/2022 DEXA showed osteopenia, 10 year risk of hip fracture 0.7.      10 year risk of any major fracture.  4.8% Recommend patient to continue calcium and vitamin D supplementation.

## 2023-06-26 NOTE — Assessment & Plan Note (Signed)
 11/17/14 right breast, invasive carcinoma, T1a N0  ER-, PR-, HER2 2+, FISH - Continue imaging surveillance 0/05/2021 Right breast lesion 9mm,  US guided biopsy - organized fat necrosis no atypia or malignancy

## 2023-07-24 DIAGNOSIS — Z1331 Encounter for screening for depression: Secondary | ICD-10-CM | POA: Diagnosis not present

## 2023-07-24 DIAGNOSIS — E669 Obesity, unspecified: Secondary | ICD-10-CM | POA: Diagnosis not present

## 2023-07-24 DIAGNOSIS — I129 Hypertensive chronic kidney disease with stage 1 through stage 4 chronic kidney disease, or unspecified chronic kidney disease: Secondary | ICD-10-CM | POA: Diagnosis not present

## 2023-07-24 DIAGNOSIS — D631 Anemia in chronic kidney disease: Secondary | ICD-10-CM | POA: Diagnosis not present

## 2023-07-24 DIAGNOSIS — E785 Hyperlipidemia, unspecified: Secondary | ICD-10-CM | POA: Diagnosis not present

## 2023-07-24 DIAGNOSIS — E1122 Type 2 diabetes mellitus with diabetic chronic kidney disease: Secondary | ICD-10-CM | POA: Diagnosis not present

## 2023-07-24 DIAGNOSIS — Z0000 Encounter for general adult medical examination without abnormal findings: Secondary | ICD-10-CM | POA: Diagnosis not present

## 2023-07-24 DIAGNOSIS — C50911 Malignant neoplasm of unspecified site of right female breast: Secondary | ICD-10-CM | POA: Diagnosis not present

## 2023-07-24 DIAGNOSIS — K219 Gastro-esophageal reflux disease without esophagitis: Secondary | ICD-10-CM | POA: Diagnosis not present

## 2023-07-24 DIAGNOSIS — M109 Gout, unspecified: Secondary | ICD-10-CM | POA: Diagnosis not present

## 2023-08-29 DIAGNOSIS — I10 Essential (primary) hypertension: Secondary | ICD-10-CM | POA: Diagnosis not present

## 2023-08-29 DIAGNOSIS — E1122 Type 2 diabetes mellitus with diabetic chronic kidney disease: Secondary | ICD-10-CM | POA: Diagnosis not present

## 2023-08-29 DIAGNOSIS — R809 Proteinuria, unspecified: Secondary | ICD-10-CM | POA: Diagnosis not present

## 2023-08-29 DIAGNOSIS — N184 Chronic kidney disease, stage 4 (severe): Secondary | ICD-10-CM | POA: Diagnosis not present

## 2023-08-29 DIAGNOSIS — N2581 Secondary hyperparathyroidism of renal origin: Secondary | ICD-10-CM | POA: Diagnosis not present

## 2023-09-05 DIAGNOSIS — N184 Chronic kidney disease, stage 4 (severe): Secondary | ICD-10-CM | POA: Diagnosis not present

## 2023-09-05 DIAGNOSIS — E1129 Type 2 diabetes mellitus with other diabetic kidney complication: Secondary | ICD-10-CM | POA: Diagnosis not present

## 2023-09-05 DIAGNOSIS — I10 Essential (primary) hypertension: Secondary | ICD-10-CM | POA: Diagnosis not present

## 2023-09-05 DIAGNOSIS — N2581 Secondary hyperparathyroidism of renal origin: Secondary | ICD-10-CM | POA: Diagnosis not present

## 2023-09-05 DIAGNOSIS — E1122 Type 2 diabetes mellitus with diabetic chronic kidney disease: Secondary | ICD-10-CM | POA: Diagnosis not present

## 2023-09-05 DIAGNOSIS — R809 Proteinuria, unspecified: Secondary | ICD-10-CM | POA: Diagnosis not present

## 2023-09-13 DIAGNOSIS — Z794 Long term (current) use of insulin: Secondary | ICD-10-CM | POA: Diagnosis not present

## 2023-09-13 DIAGNOSIS — R809 Proteinuria, unspecified: Secondary | ICD-10-CM | POA: Diagnosis not present

## 2023-09-13 DIAGNOSIS — I10 Essential (primary) hypertension: Secondary | ICD-10-CM | POA: Diagnosis not present

## 2023-09-13 DIAGNOSIS — E1122 Type 2 diabetes mellitus with diabetic chronic kidney disease: Secondary | ICD-10-CM | POA: Diagnosis not present

## 2023-09-13 DIAGNOSIS — E1129 Type 2 diabetes mellitus with other diabetic kidney complication: Secondary | ICD-10-CM | POA: Diagnosis not present

## 2023-09-13 DIAGNOSIS — N184 Chronic kidney disease, stage 4 (severe): Secondary | ICD-10-CM | POA: Diagnosis not present

## 2023-11-02 ENCOUNTER — Other Ambulatory Visit: Payer: Self-pay | Admitting: Oncology

## 2023-12-14 ENCOUNTER — Ambulatory Visit
Admission: RE | Admit: 2023-12-14 | Discharge: 2023-12-14 | Disposition: A | Discharge status: Home / Self Care | Source: Ambulatory Visit | Attending: Oncology | Admitting: Oncology

## 2023-12-14 DIAGNOSIS — R928 Other abnormal and inconclusive findings on diagnostic imaging of breast: Secondary | ICD-10-CM | POA: Diagnosis not present

## 2023-12-14 DIAGNOSIS — D0512 Intraductal carcinoma in situ of left breast: Secondary | ICD-10-CM | POA: Diagnosis not present

## 2023-12-14 DIAGNOSIS — R92323 Mammographic fibroglandular density, bilateral breasts: Secondary | ICD-10-CM | POA: Diagnosis not present

## 2023-12-21 ENCOUNTER — Encounter: Payer: Self-pay | Admitting: Oncology

## 2023-12-21 ENCOUNTER — Inpatient Hospital Stay: Admitting: Oncology

## 2023-12-21 ENCOUNTER — Inpatient Hospital Stay: Attending: Oncology

## 2023-12-21 VITALS — BP 148/62 | Temp 97.1°F | Resp 18

## 2023-12-21 DIAGNOSIS — Z79899 Other long term (current) drug therapy: Secondary | ICD-10-CM | POA: Diagnosis not present

## 2023-12-21 DIAGNOSIS — Z170 Estrogen receptor positive status [ER+]: Secondary | ICD-10-CM | POA: Insufficient documentation

## 2023-12-21 DIAGNOSIS — Z801 Family history of malignant neoplasm of trachea, bronchus and lung: Secondary | ICD-10-CM | POA: Diagnosis not present

## 2023-12-21 DIAGNOSIS — Z923 Personal history of irradiation: Secondary | ICD-10-CM | POA: Insufficient documentation

## 2023-12-21 DIAGNOSIS — Z79811 Long term (current) use of aromatase inhibitors: Secondary | ICD-10-CM | POA: Diagnosis not present

## 2023-12-21 DIAGNOSIS — D631 Anemia in chronic kidney disease: Secondary | ICD-10-CM

## 2023-12-21 DIAGNOSIS — D0512 Intraductal carcinoma in situ of left breast: Secondary | ICD-10-CM

## 2023-12-21 DIAGNOSIS — Z853 Personal history of malignant neoplasm of breast: Secondary | ICD-10-CM | POA: Diagnosis not present

## 2023-12-21 DIAGNOSIS — Z1722 Progesterone receptor negative status: Secondary | ICD-10-CM | POA: Insufficient documentation

## 2023-12-21 DIAGNOSIS — M8580 Other specified disorders of bone density and structure, unspecified site: Secondary | ICD-10-CM

## 2023-12-21 DIAGNOSIS — N189 Chronic kidney disease, unspecified: Secondary | ICD-10-CM | POA: Insufficient documentation

## 2023-12-21 LAB — CMP (CANCER CENTER ONLY)
ALT: 15 U/L (ref 0–44)
AST: 23 U/L (ref 15–41)
Albumin: 3.9 g/dL (ref 3.5–5.0)
Alkaline Phosphatase: 84 U/L (ref 38–126)
Anion gap: 8 (ref 5–15)
BUN: 54 mg/dL — ABNORMAL HIGH (ref 8–23)
CO2: 21 mmol/L — ABNORMAL LOW (ref 22–32)
Calcium: 8.9 mg/dL (ref 8.9–10.3)
Chloride: 109 mmol/L (ref 98–111)
Creatinine: 2.93 mg/dL — ABNORMAL HIGH (ref 0.44–1.00)
GFR, Estimated: 15 mL/min — ABNORMAL LOW (ref >60)
Glucose, Bld: 141 mg/dL — ABNORMAL HIGH (ref 70–99)
Potassium: 5.2 mmol/L — ABNORMAL HIGH (ref 3.5–5.1)
Sodium: 138 mmol/L (ref 135–145)
Total Bilirubin: 0.5 mg/dL (ref 0.0–1.2)
Total Protein: 7.2 g/dL (ref 6.5–8.1)

## 2023-12-21 LAB — CBC WITH DIFFERENTIAL (CANCER CENTER ONLY)
Abs Immature Granulocytes: 0.01 K/uL (ref 0.00–0.07)
Basophils Absolute: 0.1 K/uL (ref 0.0–0.1)
Basophils Relative: 1 %
Eosinophils Absolute: 0.2 K/uL (ref 0.0–0.5)
Eosinophils Relative: 5 %
HCT: 34.3 % — ABNORMAL LOW (ref 36.0–46.0)
Hemoglobin: 11.1 g/dL — ABNORMAL LOW (ref 12.0–15.0)
Immature Granulocytes: 0 %
Lymphocytes Relative: 31 %
Lymphs Abs: 1.7 K/uL (ref 0.7–4.0)
MCH: 30.2 pg (ref 26.0–34.0)
MCHC: 32.4 g/dL (ref 30.0–36.0)
MCV: 93.2 fL (ref 80.0–100.0)
Monocytes Absolute: 0.7 K/uL (ref 0.1–1.0)
Monocytes Relative: 13 %
Neutro Abs: 2.6 K/uL (ref 1.7–7.7)
Neutrophils Relative %: 50 %
Platelet Count: 238 K/uL (ref 150–400)
RBC: 3.68 MIL/uL — ABNORMAL LOW (ref 3.87–5.11)
RDW: 15.9 % — ABNORMAL HIGH (ref 11.5–15.5)
WBC Count: 5.3 K/uL (ref 4.0–10.5)
nRBC: 0 % (ref 0.0–0.2)

## 2023-12-21 LAB — IRON AND TIBC
Iron: 75 ug/dL (ref 28–170)
Saturation Ratios: 28 % (ref 10.4–31.8)
TIBC: 265 ug/dL (ref 250–450)
UIBC: 190 ug/dL

## 2023-12-21 LAB — FERRITIN: Ferritin: 47 ng/mL (ref 11–307)

## 2023-12-21 LAB — RETIC PANEL
Immature Retic Fract: 13.4 % (ref 2.3–15.9)
RBC.: 3.7 MIL/uL — ABNORMAL LOW (ref 3.87–5.11)
Retic Count, Absolute: 72.5 K/uL (ref 19.0–186.0)
Retic Ct Pct: 2 % (ref 0.4–3.1)
Reticulocyte Hemoglobin: 33 pg (ref >27.9)

## 2023-12-21 MED ORDER — ANASTROZOLE 1 MG PO TABS: 1.0000 mg | ORAL_TABLET | Freq: Every day | ORAL | 1 refills | Status: AC

## 2023-12-21 NOTE — Assessment & Plan Note (Addendum)
 Left DCIS ER+,  S/p lumpectomy [12/27/2020] and adjuvant radiation. Labs reviewed and discussed with patient Continue Arimidex  1 mg daily [started 05/05/2021]. Patient previously declined genetic testing. September 2025 mammogram is negative.  She prefers to stop surveillance.

## 2023-12-21 NOTE — Assessment & Plan Note (Signed)
 02/23/2022 DEXA showed osteopenia,  she declined repeating another DEXA  10 year risk of hip fracture 0.7.      10 year risk of any major fracture.  4.8% Recommend patient to continue calcium  and vitamin D  supplementation.

## 2023-12-21 NOTE — Assessment & Plan Note (Signed)
 Lab Results  Component Value Date   HGB 11.1 (L) 12/21/2023   TIBC 276 12/21/2022   IRONPCTSAT 20 12/21/2022   FERRITIN 39 12/21/2022   Likely secondary to anemia in chronic kidney disease.   She is not interested in IV Vernofer.  Recommend patient to take Vitron C 1 tab daily.

## 2023-12-21 NOTE — Assessment & Plan Note (Signed)
 11/17/14 right breast, invasive carcinoma, T1a N0  ER-, PR-, HER2 2+, FISH - Continue imaging surveillance 0/05/2021 Right breast lesion 9mm,  US guided biopsy - organized fat necrosis no atypia or malignancy

## 2023-12-21 NOTE — Progress Notes (Signed)
 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 Labs reviewed and discussed with patient Continue Arimidex  1 mg daily [started 05/05/2021]. Patient previously declined genetic testing. September 2025 mammogram is negative.  She prefers to stop surveillance.   Anemia in chronic kidney disease (CKD) Lab Results  Component Value Date   HGB 11.1 (L) 12/21/2023   TIBC 276 12/21/2022   IRONPCTSAT 20 12/21/2022   FERRITIN 39 12/21/2022   Likely secondary to anemia in chronic kidney disease.   She is not interested in IV Vernofer.  Recommend patient to take Vitron C 1 tab daily.   Osteopenia 02/23/2022 DEXA showed osteopenia,  she declined repeating another DEXA  10 year risk of hip fracture 0.7.      10 year risk of any major fracture.  4.8% Recommend patient to continue calcium  and vitamin D  supplementation.  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, invasive carcinoma, T1a N0  ER-, PR-, HER2 2+, FISH - Continue imaging surveillance 0/05/2021 Right breast lesion 9mm,  US  guided biopsy - organized fat necrosis no atypia or malignancy   Orders Placed This Encounter  Procedures   CBC with Differential (Cancer Center Only)    Standing Status:   Future    Expected Date:   06/19/2024    Expiration Date:   09/17/2024   Iron  and TIBC    Standing Status:   Future    Expected Date:   06/19/2024    Expiration Date:   09/17/2024   Ferritin    Standing Status:   Future    Expected Date:   06/19/2024    Expiration Date:   09/17/2024   Follow up in 6 months,  All questions were  answered. The patient knows to call the clinic with any problems, questions or concerns.  Call Babara, MD, PhD [facility] Hematology Oncology 12/21/2023     HISTORY OF PRESENTING ILLNESS:   Alexis Greer is a  83 y.o.  female with PMH listed below was seen in consultation at the request of  Glover Lenis, MD  for evaluation of breast cancer  Patient has a history of breast cancer in 2016.  Managed by surgery and radiation oncology. 11/17/2014, patient underwent right breast biopsy which showed invasive mammary carcinoma, grade 3, tumor spans up to 5 mm no special type, ER negative, PR negative, HER2 IHC equivocal 2+, FISH negative. Patient underwent right breast lumpectomy and sentinel lymph node biopsy.  No residual malignancy found on her lumpectomy specimen.  Sclerosing adenosis with apocrine metaplasia.  Stromal hemorrhage. She received radiation adjuvantly.  12/13/2020, bilateral breast screening mammogram showed left breast calcification warrants further evaluation.  Right breast no findings suspicious for malignancy. 12/20/2020, diagnostic left mammogram showed left breast upper outer quadrant indeterminate group of calcification. 12/27/2020, left breast upper outer stereotactic biopsy showed high-grade DCIS, comedo type with associated calcifications. 02/04/2021, patient had left breast lumpectomy which showed focal residual DCIS, low-grade.  No evidence of invasive carcinoma.  All margins were negative for DCIS.  pTis,pNx, estrogen receptor positive,> 90%  Patient was seen by radiation oncology  Dr. Chiquita and she received adjuvant radiation. Patient was referred to establish care with oncology for evaluation.  Patient was accompanied by daughter.  Patient has a history of arthritis, asthma, CKD, diabetes, GERD, hypertension and obesity.  Family history is negative for breast cancer.  Positive for lung cancer in sister.   INTERVAL HISTORY Alexis Greer is a 83 y.o.  female who has above history reviewed by me today presents for follow up visit for left breast high-grade DCIS [2022], right breast invasive carcinoma [2016] Patient reports feeling well.  She takes Arimidex  1 mg daily.   Overall tolerates well with some side effects.  Chronic joint pain.   Patient has no new complaints today.  Review of Systems  Constitutional:  Positive for fatigue. Negative for appetite change, chills and fever.  HENT:   Negative for hearing loss and voice change.   Eyes:  Negative for eye problems.  Respiratory:  Negative for chest tightness and cough.   Cardiovascular:  Negative for chest pain.  Gastrointestinal:  Negative for abdominal distention, abdominal pain and blood in stool.  Endocrine: Negative for hot flashes.  Genitourinary:  Negative for difficulty urinating and frequency.   Musculoskeletal:  Positive for arthralgias.  Skin:  Negative for itching and rash.  Neurological:  Negative for extremity weakness.  Hematological:  Negative for adenopathy.  Psychiatric/Behavioral:  Negative for confusion.     MEDICAL HISTORY:  Past Medical History:  Diagnosis Date   Anemia    Arthritis    Asthma    B12 deficiency    Breast cancer (HCC) 11/17/2014   right breast lumpectomy, triple negative, with mammosite tx. Chemo with held due to CVA after lumpectomy   CKD (chronic kidney disease), stage IV (HCC)    Complication of anesthesia 2016   had a stroke during the breast lumpectomy   Diabetes mellitus without complication (HCC)    Dyspnea    GERD (gastroesophageal reflux disease)    Gout    History of hiatal hernia    Hyperlipidemia    Hyperparathyroidism    Hypertension    Malignant neoplasm (HCC)    Morbid obesity (HCC)    Personal history of radiation therapy 2016    mammosite radiation-RIGHT lumpectomy   Pneumonia    Sleep apnea    Stroke (HCC) 2016   post lumpectomy   Vitamin D  deficiency     SURGICAL HISTORY: Past Surgical History:   Procedure Laterality Date   ABDOMINAL HYSTERECTOMY  1979   BREAST BIOPSY Right 11/17/2014   Invasive mammory carcinoma   BREAST BIOPSY Right 12/01/2014   Procedure: BREAST BIOPSY WITH NEEDLE LOCALIZATION;  Surgeon: Louanne KANDICE Muse, MD;  Location: [facility];  Service: General;  Laterality: Right;   BREAST BIOPSY Left 12/27/2020   Affirm bx-asymmetry/calcs Ribbon clip-path pending   BREAST BIOPSY Right 12/27/2021   us  bx, coil clip, ORGANIZING FAT NECROSIS WITH CALCIFICATION.   BREAST LUMPECTOMY Right 2016   INVASIVE MAMMARY CARCINOMA    BREAST LUMPECTOMY WITH SENTINEL LYMPH NODE BIOPSY Right 12/01/2014   Procedure: BREAST LUMPECTOMY WITH SENTINEL LYMPH NODE BX;  Surgeon: Louanne KANDICE Muse, MD;  Location: [facility];  Service: General;  Laterality: Right;   CHOLECYSTECTOMY  2020   COLONOSCOPY WITH PROPOFOL  N/A 06/13/2016   Procedure: COLONOSCOPY WITH PROPOFOL ;  Surgeon: Gladis RAYMOND Mariner, MD;  Location: [facility];  Service: Endoscopy;  Laterality: N/A;   COLONOSCOPY WITH PROPOFOL  N/A 12/03/2019   Procedure: COLONOSCOPY WITH PROPOFOL ;  Surgeon: Aundria, Teodoro K, MD;  Location: [facility];  Service: Gastroenterology;  Laterality: N/A;   EYE SURGERY Bilateral    CATARACT REMOVAL   LOWER EXTREMITY INTERVENTION Left 11/09/2016   Procedure: LOWER EXTREMITY INTERVENTION;  Surgeon: Marea Selinda RAMAN, MD;  Location: [facility]VASIVE CV [facility];  Service: Cardiovascular;  Laterality: Left;   MASTECTOMY Right    lumpectomy    OOPHORECTOMY  2014   PARATHYROIDECTOMY N/A 07/14/2020   Procedure: PARATHYROIDECTOMY, RNFA to assist;  Surgeon: Marolyn Nest, MD;  Location: [facility];  Service: General;  Laterality: N/A;   PARTIAL MASTECTOMY WITH NEEDLE LOCALIZATION Left 02/04/2021   Procedure: PARTIAL MASTECTOMY WITH NEEDLE LOCALIZATION;  Surgeon: Rodolph Romano, MD;  Location: [facility];  Service: General;  Laterality: Left;   TONSILLECTOMY     XI ROBOTIC ASSISTED VENTRAL HERNIA N/A  03/17/2022   Procedure: XI ROBOTIC ASSISTED VENTRAL HERNIA;  Surgeon: Rodolph Romano, MD;  Location: [facility];  Service: General;  Laterality: N/A;    SOCIAL HISTORY: Social History   Socioeconomic History   Marital status: Divorced    Spouse name: Not on file   Number of children: 2   Years of education: 12   Highest education level: High school graduate  Occupational History   Not on file  Tobacco Use   Smoking status: Former    Current packs/day: 0.00    Types: Cigarettes    Quit date: 08/25/1993    Years since quitting: 30.3   Smokeless tobacco: Never  Vaping Use   Vaping status: Never Used  Substance and Sexual Activity   Alcohol use: No    Alcohol/week: 0.0 standard drinks of alcohol   Drug use: No   Sexual activity: Not Currently  Other Topics Concern   Not on file  Social History Narrative   Patient is a TEFL teacher Witness   Social Drivers of Corporate investment banker Strain: Low Risk  (07/24/2023)   Received from [facility]   Overall Financial Resource Strain (CARDIA)    Difficulty of Paying Living Expenses: Not hard at all  Food Insecurity: No Food Insecurity (07/24/2023)   Received from [facility]   Hunger Vital Sign    Within the past 12 months, you worried that your food would run out before you got the money to buy more.: Never true    Within the past 12 months, the food you bought just didn't last and you didn't have money to get more.: Never true  Transportation Needs: No Transportation Needs (07/24/2023)   Received from [facility] - Transportation    In the past 12 months, has lack of transportation kept you from medical appointments or from getting medications?: No    Lack of Transportation (Non-Medical): No  Physical Activity: Inactive (10/30/2018)   Exercise Vital Sign    Days of Exercise per Week: 0 days    Minutes of Exercise per Session: 0 min  Stress: No Stress Concern  Present (10/30/2018)   [employer] of Occupational Health - Occupational Stress Questionnaire    Feeling of Stress : Only a little  Social Connections: Moderately Integrated (10/30/2018)   Social Connection and Isolation Panel    Frequency of Communication with Friends and Family: More than three times a week    Frequency of Social Gatherings with Friends and Family: More than three times a week    Attends Religious Services: More than 4 times per year    Active Member of [employer] or Organizations: Yes  Attends Banker Meetings: More than 4 times per year    Marital Status: Divorced  Intimate Partner Violence: Not At Risk (10/30/2018)   Humiliation, Afraid, Rape, and Kick questionnaire    Fear of Current or Ex-Partner: No    Emotionally Abused: No    Physically Abused: No    Sexually Abused: No    FAMILY HISTORY: Family History  Problem Relation Age of Onset   CAD Father    Lung cancer Sister        smoker   Thyroid  disease Daughter    Thyroid  disease Daughter    Breast cancer Neg Hx     ALLERGIES:  is allergic to oxycodone .  MEDICATIONS:  Current Outpatient Medications  Medication Sig Dispense Refill   ACCU-CHEK AVIVA PLUS test strip      acetaminophen  (TYLENOL ) 325 MG tablet Take 650 mg by mouth every 6 (six) hours as needed for moderate pain or headache.     allopurinol  (ZYLOPRIM ) 100 MG tablet Take 100 mg by mouth 2 (two) times daily.      amLODipine  (NORVASC ) 10 MG tablet Take 10 mg by mouth daily.     aspirin  EC 81 MG tablet Take 81 mg by mouth daily.     atorvastatin  (LIPITOR) 40 MG tablet Take 40 mg by mouth daily at 6 PM.     Cholecalciferol  (VITAMIN D ) 125 MCG (5000 UT) CAPS Take 5,000 Units by mouth daily.     Collagen Hydrolysate POWD 1 Scoop by Does not apply route daily.     diphenhydrAMINE (BENADRYL) 25 MG tablet Take 50 mg by mouth every 6 (six) hours as needed for allergies.     DROPLET PEN NEEDLES 31G X 5 MM MISC      ezetimibe (ZETIA) 10 MG  tablet Take 10 mg by mouth daily.     ferrous sulfate  325 (65 FE) MG tablet Take 325 mg by mouth.     Insulin  Isophane & Regular Human (NOVOLIN  70/30 FLEXPEN) (70-30) 100 UNIT/ML PEN Inject 5 Units into the skin 2 (two) times daily. (Patient taking differently: Inject 22-38 Units into the skin See admin instructions. Inject 22 units in the morning with breakfast and 38 units in the evening with dinner (half the dose if blood sugar is under 70)) 15 mL 11   Iron -Vitamin C  65-125 MG TABS Take 1 tablet by mouth daily. 90 tablet 1   losartan (COZAAR) 25 MG tablet Take 25 mg by mouth 2 (two) times daily.     magnesium oxide (MAG-OX) 400 MG tablet Take 800 mg by mouth 2 (two) times daily.     metoprolol succinate (TOPROL-XL) 50 MG 24 hr tablet Take 100 mg by mouth daily.     Multiple Vitamins-Minerals (MULTIVITAMIN WITH MINERALS) tablet Take 1 tablet by mouth daily.     omeprazole (PRILOSEC) 20 MG capsule Take 20 mg by mouth 2 (two) times daily.     torsemide (DEMADEX) 20 MG tablet TAKE 1 TABLET BY MOUTH ONCE DAILY AS DIRECTED FOR LEG EDEMA     vitamin B-12 (CYANOCOBALAMIN ) 1000 MCG tablet Take 1,000 mcg by mouth daily.     vitamin E  400 UNIT capsule Take 400 Units by mouth daily.      anastrozole  (ARIMIDEX ) 1 MG tablet Take 1 tablet (1 mg total) by mouth daily. 90 tablet 1   No current facility-administered medications for this visit.     PHYSICAL EXAMINATION: ECOG PERFORMANCE STATUS: 1 - Symptomatic but completely ambulatory Vitals:  12/21/23 1019  BP: (!) 148/62  Resp: 18  Temp: (!) 97.1 F (36.2 C)   There were no vitals filed for this visit.   Physical Exam Constitutional:      General: She is not in acute distress.    Appearance: She is obese.  HENT:     Head: Normocephalic and atraumatic.  Eyes:     General: No scleral icterus. Cardiovascular:     Rate and Rhythm: Normal rate and regular rhythm.     Heart sounds: Normal heart sounds.  Pulmonary:     Effort: Pulmonary effort  is normal. No respiratory distress.     Breath sounds: No wheezing.  Abdominal:     General: Bowel sounds are normal. There is no distension.     Palpations: Abdomen is soft.  Musculoskeletal:        General: No deformity. Normal range of motion.     Cervical back: Normal range of motion and neck supple.  Skin:    General: Skin is warm and dry.     Findings: No erythema or rash.  Neurological:     Mental Status: She is alert and oriented to person, place, and time. Mental status is at baseline.  Psychiatric:        Mood and Affect: Mood normal.     LABORATORY DATA:  I have reviewed the data as listed    Latest Ref Rng & Units 12/21/2023    9:55 AM 06/26/2023    9:28 AM 12/21/2022    9:37 AM  CBC  WBC 4.0 - 10.5 K/uL 5.3  5.5  5.1   Hemoglobin 12.0 - 15.0 g/dL 88.8  89.0  89.0   Hematocrit 36.0 - 46.0 % 34.3  34.2  34.5   Platelets 150 - 400 K/uL 238  237  257       Latest Ref Rng & Units 12/21/2023    9:56 AM 06/26/2023    9:28 AM 12/21/2022    9:37 AM  CMP  Glucose 70 - 99 mg/dL 858  844  864   BUN 8 - 23 mg/dL 54  44  43   Creatinine 0.44 - 1.00 mg/dL 7.06  7.62  7.56   Sodium 135 - 145 mmol/L 138  136  136   Potassium 3.5 - 5.1 mmol/L 5.2  5.1  5.1   Chloride 98 - 111 mmol/L 109  107  108   CO2 22 - 32 mmol/L 21  20  17    Calcium  8.9 - 10.3 mg/dL 8.9  8.9  8.9   Total Protein 6.5 - 8.1 g/dL 7.2  6.9  7.1   Total Bilirubin 0.0 - 1.2 mg/dL 0.5  0.5  0.6   Alkaline Phos 38 - 126 U/L 84  105  111   AST 15 - 41 U/L 23  25  21    ALT 0 - 44 U/L 15  20  17      RADIOGRAPHIC STUDIES: I have personally reviewed the radiological images as listed and agreed with the findings in the report. MM 3D DIAGNOSTIC MAMMOGRAM BILATERAL BREAST Result Date: 12/14/2023 CLINICAL DATA:  Patient with history of RIGHT breast lumpectomy for IDC in 2016 (radiation, no chemotherapy) and LEFT breast lumpectomy for DCIS in November of 2022 (status post radiation). Negative margins in 2022. On  Arimidex . 2023 biopsy showing benign fat necrosis. EXAM: DIGITAL DIAGNOSTIC BILATERAL MAMMOGRAM WITH TOMOSYNTHESIS AND CAD TECHNIQUE: Bilateral digital diagnostic mammography and breast tomosynthesis was performed. The images were evaluated  with computer-aided detection. COMPARISON:  Previous exam(s). ACR Breast Density Category b: There are scattered areas of fibroglandular density. FINDINGS: There is stable density and architectural distortion within the RIGHT breast, corresponding to the site of remote prior surgery. A coil biopsy marking clip is noted within the surgical site, correlating with biopsy-proven benign fat necrosis. There is density and architectural distortion within the LEFT breast, corresponding to the site of prior lumpectomy. Spot compression magnification view(s) demonstrate no evidence of recurrent malignancy. A seroma is again noted. Findings are stable compared to prior. There is no mammographic evidence of malignancy in EITHER breast. IMPRESSION: Stable posttreatment changes of the BILATERAL breasts without mammographic evidence of malignancy. RECOMMENDATION: As the patient is now over 2 years out from her lumpectomy, she may return to annual screening mammography in 1 year. Given her history of breast cancer, she remains eligible for annual diagnostic mammography, if preferred. I have discussed the findings and recommendations with the patient. If applicable, a reminder letter will be sent to the patient regarding the next appointment. BI-RADS CATEGORY  2: Benign. Electronically Signed   By: Norleen Croak M.D.   On: 12/14/2023 09:53

## 2023-12-25 DIAGNOSIS — Z08 Encounter for follow-up examination after completed treatment for malignant neoplasm: Secondary | ICD-10-CM | POA: Diagnosis not present

## 2023-12-25 DIAGNOSIS — Z853 Personal history of malignant neoplasm of breast: Secondary | ICD-10-CM | POA: Diagnosis not present

## 2023-12-26 ENCOUNTER — Other Ambulatory Visit

## 2023-12-26 ENCOUNTER — Ambulatory Visit: Admitting: Oncology

## 2024-01-01 DIAGNOSIS — N184 Chronic kidney disease, stage 4 (severe): Secondary | ICD-10-CM | POA: Diagnosis not present

## 2024-01-01 DIAGNOSIS — N2581 Secondary hyperparathyroidism of renal origin: Secondary | ICD-10-CM | POA: Diagnosis not present

## 2024-01-01 DIAGNOSIS — E1122 Type 2 diabetes mellitus with diabetic chronic kidney disease: Secondary | ICD-10-CM | POA: Diagnosis not present

## 2024-01-01 DIAGNOSIS — E1129 Type 2 diabetes mellitus with other diabetic kidney complication: Secondary | ICD-10-CM | POA: Diagnosis not present

## 2024-01-01 DIAGNOSIS — R809 Proteinuria, unspecified: Secondary | ICD-10-CM | POA: Diagnosis not present

## 2024-01-01 DIAGNOSIS — I10 Essential (primary) hypertension: Secondary | ICD-10-CM | POA: Diagnosis not present

## 2024-01-08 DIAGNOSIS — D631 Anemia in chronic kidney disease: Secondary | ICD-10-CM | POA: Diagnosis not present

## 2024-01-08 DIAGNOSIS — N189 Chronic kidney disease, unspecified: Secondary | ICD-10-CM | POA: Diagnosis not present

## 2024-01-08 DIAGNOSIS — N2581 Secondary hyperparathyroidism of renal origin: Secondary | ICD-10-CM | POA: Diagnosis not present

## 2024-01-08 DIAGNOSIS — E1122 Type 2 diabetes mellitus with diabetic chronic kidney disease: Secondary | ICD-10-CM | POA: Diagnosis not present

## 2024-01-08 DIAGNOSIS — I10 Essential (primary) hypertension: Secondary | ICD-10-CM | POA: Diagnosis not present

## 2024-01-08 DIAGNOSIS — N184 Chronic kidney disease, stage 4 (severe): Secondary | ICD-10-CM | POA: Diagnosis not present

## 2024-01-15 DIAGNOSIS — H524 Presbyopia: Secondary | ICD-10-CM | POA: Diagnosis not present

## 2024-02-06 DIAGNOSIS — D631 Anemia in chronic kidney disease: Secondary | ICD-10-CM | POA: Diagnosis not present

## 2024-02-06 DIAGNOSIS — E1122 Type 2 diabetes mellitus with diabetic chronic kidney disease: Secondary | ICD-10-CM | POA: Diagnosis not present

## 2024-02-06 DIAGNOSIS — Z87891 Personal history of nicotine dependence: Secondary | ICD-10-CM | POA: Diagnosis not present

## 2024-02-06 DIAGNOSIS — C50919 Malignant neoplasm of unspecified site of unspecified female breast: Secondary | ICD-10-CM | POA: Diagnosis not present

## 2024-02-06 DIAGNOSIS — Z794 Long term (current) use of insulin: Secondary | ICD-10-CM | POA: Diagnosis not present

## 2024-02-06 DIAGNOSIS — N1832 Chronic kidney disease, stage 3b: Secondary | ICD-10-CM | POA: Diagnosis not present

## 2024-02-06 DIAGNOSIS — E782 Mixed hyperlipidemia: Secondary | ICD-10-CM | POA: Diagnosis not present

## 2024-02-06 DIAGNOSIS — I129 Hypertensive chronic kidney disease with stage 1 through stage 4 chronic kidney disease, or unspecified chronic kidney disease: Secondary | ICD-10-CM | POA: Diagnosis not present

## 2024-06-19 ENCOUNTER — Other Ambulatory Visit

## 2024-06-19 ENCOUNTER — Ambulatory Visit: Admitting: Oncology
